# Patient Record
Sex: Female | Born: 2000 | Race: Black or African American | Hispanic: No | Marital: Single | Smoking: Never smoker
Health system: Southern US, Community
[De-identification: ages and names within clinical notes are randomized; demographics above are authoritative.]

## PROBLEM LIST (undated history)

## (undated) DIAGNOSIS — L0291 Cutaneous abscess, unspecified: Secondary | ICD-10-CM

## (undated) DIAGNOSIS — M6282 Rhabdomyolysis: Secondary | ICD-10-CM

## (undated) DIAGNOSIS — J302 Other seasonal allergic rhinitis: Secondary | ICD-10-CM

## (undated) DIAGNOSIS — M3300 Juvenile dermatopolymyositis, organ involvement unspecified: Secondary | ICD-10-CM

## (undated) HISTORY — PX: WISDOM TOOTH EXTRACTION: SHX21

---
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
## 2019-01-07 ENCOUNTER — Telehealth: Payer: Self-pay | Admitting: Family Medicine

## 2019-01-07 NOTE — Telephone Encounter (Signed)
Patient called in to get scheduled for her missed depo injections appointment. Patient was scheduled for 11/19 @ 10:20. Patient instructed to wear a face mask for the entire appointment and no visitors are allowed with her during the visit. Patient screened for covid symptoms and denied having any

## 2019-01-09 ENCOUNTER — Other Ambulatory Visit: Payer: Self-pay

## 2019-01-09 ENCOUNTER — Ambulatory Visit (INDEPENDENT_AMBULATORY_CARE_PROVIDER_SITE_OTHER): Payer: BLUE CROSS/BLUE SHIELD | Admitting: General Practice

## 2019-01-09 VITALS — BP 117/71 | HR 72 | Ht 63.0 in | Wt 237.0 lb

## 2019-01-09 DIAGNOSIS — Z3042 Encounter for surveillance of injectable contraceptive: Secondary | ICD-10-CM

## 2019-01-09 DIAGNOSIS — Z3202 Encounter for pregnancy test, result negative: Secondary | ICD-10-CM

## 2019-01-09 LAB — POCT PREGNANCY, URINE: Preg Test, Ur: NEGATIVE

## 2019-01-09 NOTE — Progress Notes (Signed)
Haley Sandoval here for Depo-Provera  Injection. Last injection was 08/19/18. Patient reports intercourse without condoms. LMP 11/5 and last date of unprotected intercourse 11/14. UPT is - today. Discussed with patient returning in a week for repeat UPT & if negative can receive depo then. Advised no intercourse between now & then or to use condoms. Condoms also provided today. Patient verbalized understanding & had no questions.  Derinda Late, RN 01/09/2019  10:39 AM

## 2019-01-09 NOTE — Progress Notes (Signed)
Patient seen and assessed by nursing staff.  Agree with documentation and plan.  

## 2019-01-20 ENCOUNTER — Ambulatory Visit (INDEPENDENT_AMBULATORY_CARE_PROVIDER_SITE_OTHER): Payer: BLUE CROSS/BLUE SHIELD | Admitting: *Deleted

## 2019-01-20 ENCOUNTER — Other Ambulatory Visit: Payer: Self-pay

## 2019-01-20 DIAGNOSIS — Z3042 Encounter for surveillance of injectable contraceptive: Secondary | ICD-10-CM

## 2019-01-20 DIAGNOSIS — Z30019 Encounter for initial prescription of contraceptives, unspecified: Secondary | ICD-10-CM

## 2019-01-20 LAB — POCT PREGNANCY, URINE: Preg Test, Ur: NEGATIVE

## 2019-01-20 MED ORDER — MEDROXYPROGESTERONE ACETATE 150 MG/ML IM SUSP
150.0000 mg | Freq: Once | INTRAMUSCULAR | Status: AC
  Administered 2019-01-20: 19:00:00 150 mg via INTRAMUSCULAR

## 2019-01-20 NOTE — Progress Notes (Signed)
I reviewed the above documentation and agree with the plan as above  

## 2019-01-20 NOTE — Progress Notes (Signed)
Here for depo injection. UPT negative today and on intercourse in the last wk. Has had depo in the past but late with this dose. Depo given and pt tol well. Will schedule next injection today when checks out.

## 2019-04-08 ENCOUNTER — Other Ambulatory Visit: Payer: Self-pay

## 2019-04-08 ENCOUNTER — Ambulatory Visit (INDEPENDENT_AMBULATORY_CARE_PROVIDER_SITE_OTHER): Payer: BLUE CROSS/BLUE SHIELD

## 2019-04-08 VITALS — BP 132/90 | HR 72 | Wt 232.5 lb

## 2019-04-08 DIAGNOSIS — Z3042 Encounter for surveillance of injectable contraceptive: Secondary | ICD-10-CM

## 2019-04-08 MED ORDER — MEDROXYPROGESTERONE ACETATE 150 MG/ML IM SUSP
150.0000 mg | Freq: Once | INTRAMUSCULAR | Status: AC
  Administered 2019-04-08: 14:00:00 150 mg via INTRAMUSCULAR

## 2019-04-08 NOTE — Progress Notes (Signed)
Patient seen and assessed by nursing staff during this encounter. I have reviewed the chart and agree with the documentation and plan.  Sharyon Cable, CNM 04/08/2019 4:28 PM

## 2019-04-08 NOTE — Progress Notes (Signed)
Haley Sandoval here for Depo-Provera  Injection.  Injection administered without complication. Patient will return in 3 months for next injection.  Pt states she is interested in changing birth control method because she feels like Depo Provera is causing weight gain. Explained to pt she will need to have provider visit in order to discuss new BC. Available types of [facility] reviewed with pt; handouts given for pt to review. Pt states she will schedule annual visit/BC discussion once she has resolved some temporary transportation issues.   Marjo Bicker, RN 04/08/2019  1:33 PM

## 2019-06-24 ENCOUNTER — Ambulatory Visit (INDEPENDENT_AMBULATORY_CARE_PROVIDER_SITE_OTHER): Payer: Medicaid Other

## 2019-06-24 ENCOUNTER — Other Ambulatory Visit: Payer: Self-pay

## 2019-06-24 VITALS — BP 126/73 | HR 98 | Wt 238.2 lb

## 2019-06-24 DIAGNOSIS — Z3042 Encounter for surveillance of injectable contraceptive: Secondary | ICD-10-CM

## 2019-06-24 MED ORDER — MEDROXYPROGESTERONE ACETATE 150 MG/ML IM SUSP
150.0000 mg | Freq: Once | INTRAMUSCULAR | Status: AC
  Administered 2019-06-24: 150 mg via INTRAMUSCULAR

## 2019-06-24 NOTE — Progress Notes (Signed)
Haley Sandoval here for Depo-Provera  Injection. Per chart review, pt was considering changing birth control method at last visit. Pt states she would like to continue Depo Provera at this time.  Injection administered without complication. Patient will return in 3 months for next injection.  Marjo Bicker, RN 06/24/2019 1415

## 2019-07-03 NOTE — Progress Notes (Signed)
Chart reviewed for nurse visit. Agree with plan of care.   Natallia Stellmach Lorraine, CNM 07/03/2019 5:52 PM   

## 2019-07-08 DIAGNOSIS — Z113 Encounter for screening for infections with a predominantly sexual mode of transmission: Secondary | ICD-10-CM | POA: Diagnosis not present

## 2019-07-08 DIAGNOSIS — R8281 Pyuria: Secondary | ICD-10-CM | POA: Diagnosis not present

## 2019-09-09 ENCOUNTER — Other Ambulatory Visit: Payer: Self-pay

## 2019-09-09 ENCOUNTER — Ambulatory Visit (INDEPENDENT_AMBULATORY_CARE_PROVIDER_SITE_OTHER): Payer: Medicaid Other | Admitting: General Practice

## 2019-09-09 VITALS — BP 122/80 | HR 54 | Ht 67.0 in | Wt 263.0 lb

## 2019-09-09 DIAGNOSIS — Z3042 Encounter for surveillance of injectable contraceptive: Secondary | ICD-10-CM | POA: Diagnosis not present

## 2019-09-09 MED ORDER — MEDROXYPROGESTERONE ACETATE 150 MG/ML IM SUSP
150.0000 mg | Freq: Once | INTRAMUSCULAR | Status: AC
  Administered 2019-09-09: 150 mg via INTRAMUSCULAR

## 2019-09-09 NOTE — Progress Notes (Signed)
Lafe Garin here for Depo-Provera  Injection.  Injection administered without complication. Patient will return in 3 months for next injection.  Marylynn Pearson, RN 09/09/2019  9:07 AM

## 2019-09-10 NOTE — Progress Notes (Signed)
Chart reviewed for nurse visit. Agree with plan of care.   Gamaliel Charney Lorraine, CNM 09/10/2019 10:35 AM   

## 2019-11-25 ENCOUNTER — Ambulatory Visit: Payer: Medicaid Other | Admitting: Lactation Services

## 2019-11-25 ENCOUNTER — Ambulatory Visit (INDEPENDENT_AMBULATORY_CARE_PROVIDER_SITE_OTHER): Payer: Medicaid Other | Admitting: Nurse Practitioner

## 2019-11-25 ENCOUNTER — Other Ambulatory Visit: Payer: Self-pay

## 2019-11-25 ENCOUNTER — Encounter: Payer: Self-pay | Admitting: Nurse Practitioner

## 2019-11-25 ENCOUNTER — Encounter: Payer: Self-pay | Admitting: Lactation Services

## 2019-11-25 VITALS — BP 124/73 | HR 70 | Ht 65.0 in | Wt 278.8 lb

## 2019-11-25 DIAGNOSIS — Z01419 Encounter for gynecological examination (general) (routine) without abnormal findings: Secondary | ICD-10-CM

## 2019-11-25 DIAGNOSIS — Z3042 Encounter for surveillance of injectable contraceptive: Secondary | ICD-10-CM

## 2019-11-25 MED ORDER — MEDROXYPROGESTERONE ACETATE 150 MG/ML IM SUSP
150.0000 mg | Freq: Once | INTRAMUSCULAR | Status: AC
  Administered 2019-11-25: 150 mg via INTRAMUSCULAR

## 2019-11-25 NOTE — Patient Instructions (Addendum)
Intrauterine Device Information An intrauterine device (IUD) is a medical device that is inserted in the uterus to prevent pregnancy. It is a small, T-shaped device that has one or two nylon strings hanging down from it. The strings hang out of the lower part of the uterus (cervix) to allow for future IUD removal. There are two types of IUDs available:  Hormone IUD. This type of IUD is made of plastic and contains the hormone progestin (synthetic progesterone). A hormone IUD may last 3-5 years.  Copper IUD. This type of IUD has copper wire wrapped around it. A copper IUD may last up to 10 years. How is an IUD inserted? An IUD is inserted through the vagina and placed into the uterus with a minor medical procedure. The exact procedure for IUD insertion may vary among health care providers and hospitals. How does an IUD work? Synthetic progesterone in a hormonal IUD prevents pregnancy by:  Thickening cervical mucus to prevent sperm from entering the uterus.  Thinning the uterine lining to prevent a fertilized egg from being implanted there. Copper in a copper IUD prevents pregnancy by making the uterus and fallopian tubes produce a fluid that kills sperm. What are the advantages of an IUD? Advantages of either type of IUD  It is highly effective in preventing pregnancy.  It is reversible. You can become pregnant shortly after the IUD is removed.  It is low-maintenance and can stay in place for a long time.  There are no estrogen-related side effects.  It can be used when breastfeeding.  It is not associated with weight gain.  It can be inserted right after childbirth, an abortion, or a miscarriage. Advantages of a hormone IUD  If it is inserted within 7 days of your period starting, it works right after it is inserted. If the hormone IUD is inserted at any other time in your cycle, you will need to use a backup method of birth control for 7 days after insertion.  It can make  menstrual periods lighter.  It can reduce menstrual cramping.  It can be used for 3-5 years. Advantages of a copper IUD  It works right after it is inserted.  It can be used as a form of emergency birth control if it is inserted within 5 days after having unprotected sex.  It does not interfere with your body's natural hormones.  It can be used for 10 years. What are the disadvantages of an IUD?  An IUD may cause irregular menstrual bleeding for a period of time after insertion.  You may have pain during insertion and have cramping and vaginal bleeding after insertion.  An IUD may cut the uterus (uterine perforation) when it is inserted. This is rare.  An IUD may cause pelvic inflammatory disease (PID), which is an infection in the uterus and fallopian tubes. This is rare, and it usually happens during the first 20 days after the IUD is inserted.  A copper IUD can make your menstrual flow heavier and more painful. How is an IUD removed?  You will lie on your back with your knees bent and your feet in footrests (stirrups).  A device will be inserted into your vagina to spread apart the vaginal walls (speculum). This will allow your health care provider to see the strings attached to the IUD.  Your health care provider will use a small instrument (forceps) to grasp the IUD strings and pull firmly until the IUD is removed. You may have some discomfort   when the IUD is removed. Your health care provider may recommend taking over-the-counter pain relievers, such as ibuprofen, before the procedure. You may also have minor spotting for a few days after the procedure. The exact procedure for IUD removal may vary among health care providers and hospitals. Is the IUD right for me? Your health care provider will make sure you are a good candidate for an IUD and will discuss the advantages, disadvantages, and possible side effects with you. Summary  An intrauterine device (IUD) is a medical  device that is inserted in the uterus to prevent pregnancy. It is a small, T-shaped device that has one or two nylon strings hanging down from it.  A hormone IUD contains the hormone progestin (synthetic progesterone). A copper IUD has copper wire wrapped around it.  Synthetic progesterone in a hormone IUD prevents pregnancy by thickening cervical mucus and thinning the walls of the uterus. Copper in a copper IUD prevents pregnancy by making the uterus and fallopian tubes produce a fluid that kills sperm.  A hormone IUD can be left in place for 3-5 years. A copper IUD can be left in place for up to 10 years.  An IUD is inserted and removed by a health care provider. You may feel some pain during insertion and removal. Your health care provider may recommend taking over-the-counter pain medicine, such as ibuprofen, before an IUD procedure. This information is not intended to replace advice given to you by your health care provider. Make sure you discuss any questions you have with your health care provider. Document Revised: 01/19/2017 Document Reviewed: 03/07/2016 Elsevier Patient Education  2020 [employer].    Levonorgestrel intrauterine device (IUD) What is this medicine? LEVONORGESTREL IUD (LEE voe nor jes trel) is a contraceptive (birth control) device. The device is placed inside the uterus by a healthcare professional. It is used to prevent pregnancy. This device can also be used to treat heavy bleeding that occurs during your period. This medicine may be used for other purposes; ask your health care provider or pharmacist if you have questions. COMMON BRAND NAME(S): Cameron Ali What should I tell my health care provider before I take this medicine? They need to know if you have any of these conditions:  abnormal Pap smear  cancer of the breast, uterus, or cervix  diabetes  endometritis  genital or pelvic infection now or in the past  have more than one  sexual partner or your partner has more than one partner  heart disease  history of an ectopic or tubal pregnancy  immune system problems  IUD in place  liver disease or tumor  problems with blood clots or take blood-thinners  seizures  use intravenous drugs  uterus of unusual shape  vaginal bleeding that has not been explained  an unusual or allergic reaction to levonorgestrel, other hormones, silicone, or polyethylene, medicines, foods, dyes, or preservatives  pregnant or trying to get pregnant  breast-feeding How should I use this medicine? This device is placed inside the uterus by a health care professional. Talk to your pediatrician regarding the use of this medicine in children. Special care may be needed. Overdosage: If you think you have taken too much of this medicine contact a poison control center or emergency room at once. NOTE: This medicine is only for you. Do not share this medicine with others. What if I miss a dose? This does not apply. Depending on the brand of device you have inserted, the device  will need to be replaced every 3 to 6 years if you wish to continue using this type of birth control. What may interact with this medicine? Do not take this medicine with any of the following medications:  amprenavir  bosentan  fosamprenavir This medicine may also interact with the following medications:  aprepitant  armodafinil  barbiturate medicines for inducing sleep or treating seizures  bexarotene  boceprevir  griseofulvin  medicines to treat seizures like carbamazepine, ethotoin, felbamate, oxcarbazepine, phenytoin, topiramate  modafinil  pioglitazone  rifabutin  rifampin  rifapentine  some medicines to treat HIV infection like atazanavir, efavirenz, indinavir, lopinavir, nelfinavir, tipranavir, ritonavir  St. John's wort  warfarin This list may not describe all possible interactions. Give your health care provider a list of  all the medicines, herbs, non-prescription drugs, or dietary supplements you use. Also tell them if you smoke, drink alcohol, or use illegal drugs. Some items may interact with your medicine. What should I watch for while using this medicine? Visit your doctor or health care professional for regular check ups. See your doctor if you or your partner has sexual contact with others, becomes HIV positive, or gets a sexual transmitted disease. This product does not protect you against HIV infection (AIDS) or other sexually transmitted diseases. You can check the placement of the IUD yourself by reaching up to the top of your vagina with clean fingers to feel the threads. Do not pull on the threads. It is a good habit to check placement after each menstrual period. Call your doctor right away if you feel more of the IUD than just the threads or if you cannot feel the threads at all. The IUD may come out by itself. You may become pregnant if the device comes out. If you notice that the IUD has come out use a backup birth control method like condoms and call your health care provider. Using tampons will not change the position of the IUD and are okay to use during your period. This IUD can be safely scanned with magnetic resonance imaging (MRI) only under specific conditions. Before you have an MRI, tell your healthcare provider that you have an IUD in place, and which type of IUD you have in place. What side effects may I notice from receiving this medicine? Side effects that you should report to your doctor or health care professional as soon as possible:  allergic reactions like skin rash, itching or hives, swelling of the face, lips, or tongue  fever, flu-like symptoms  genital sores  high blood pressure  no menstrual period for 6 weeks during use  pain, swelling, warmth in the leg  pelvic pain or tenderness  severe or sudden headache  signs of pregnancy  stomach cramping  sudden shortness  of breath  trouble with balance, talking, or walking  unusual vaginal bleeding, discharge  yellowing of the eyes or skin Side effects that usually do not require medical attention (report to your doctor or health care professional if they continue or are bothersome):  acne  breast pain  change in sex drive or performance  changes in weight  cramping, dizziness, or faintness while the device is being inserted  headache  irregular menstrual bleeding within first 3 to 6 months of use  nausea This list may not describe all possible side effects. Call your doctor for medical advice about side effects. You may report side effects to FDA at 1-800-FDA-1088. Where should I keep my medicine? This does not apply. NOTE:  This sheet is a summary. It may not cover all possible information. If you have questions about this medicine, talk to your doctor, pharmacist, or health care provider.  2020 Elsevier/Gold Standard (2017-12-18 13:22:01)      Etonogestrel implant What is this medicine? ETONOGESTREL (et oh noe JES trel) is a contraceptive (birth control) device. It is used to prevent pregnancy. It can be used for up to 3 years. This medicine may be used for other purposes; ask your health care provider or pharmacist if you have questions. COMMON BRAND NAME(S): Implanon, Nexplanon What should I tell my health care provider before I take this medicine? They need to know if you have any of these conditions:  abnormal vaginal bleeding  blood vessel disease or blood clots  breast, cervical, endometrial, ovarian, liver, or uterine cancer  diabetes  gallbladder disease  heart disease or recent heart attack  high blood pressure  high cholesterol or triglycerides  kidney disease  liver disease  migraine headaches  seizures  stroke  tobacco smoker  an unusual or allergic reaction to etonogestrel, anesthetics or antiseptics, other medicines, foods, dyes, or  preservatives  pregnant or trying to get pregnant  breast-feeding How should I use this medicine? This device is inserted just under the skin on the inner side of your upper arm by a health care professional. Talk to your pediatrician regarding the use of this medicine in children. Special care may be needed. Overdosage: If you think you have taken too much of this medicine contact a poison control center or emergency room at once. NOTE: This medicine is only for you. Do not share this medicine with others. What if I miss a dose? This does not apply. What may interact with this medicine? Do not take this medicine with any of the following medications:  amprenavir  fosamprenavir This medicine may also interact with the following medications:  acitretin  aprepitant  armodafinil  bexarotene  bosentan  carbamazepine  certain medicines for fungal infections like fluconazole, ketoconazole, itraconazole and voriconazole  certain medicines to treat hepatitis, HIV or AIDS  cyclosporine  felbamate  griseofulvin  lamotrigine  modafinil  oxcarbazepine  phenobarbital  phenytoin  primidone  rifabutin  rifampin  rifapentine  St. John's wort  topiramate This list may not describe all possible interactions. Give your health care provider a list of all the medicines, herbs, non-prescription drugs, or dietary supplements you use. Also tell them if you smoke, drink alcohol, or use illegal drugs. Some items may interact with your medicine. What should I watch for while using this medicine? This product does not protect you against HIV infection (AIDS) or other sexually transmitted diseases. You should be able to feel the implant by pressing your fingertips over the skin where it was inserted. Contact your doctor if you cannot feel the implant, and use a non-hormonal birth control method (such as condoms) until your doctor confirms that the implant is in place. Contact  your doctor if you think that the implant may have broken or become bent while in your arm. You will receive a user card from your health care provider after the implant is inserted. The card is a record of the location of the implant in your upper arm and when it should be removed. Keep this card with your health records. What side effects may I notice from receiving this medicine? Side effects that you should report to your doctor or health care professional as soon as possible:  allergic reactions like  skin rash, itching or hives, swelling of the face, lips, or tongue  breast lumps, breast tissue changes, or discharge  breathing problems  changes in emotions or moods  coughing up blood  if you feel that the implant may have broken or bent while in your arm  high blood pressure  pain, irritation, swelling, or bruising at the insertion site  scar at site of insertion  signs of infection at the insertion site such as fever, and skin redness, pain or discharge  signs and symptoms of a blood clot such as breathing problems; changes in vision; chest pain; severe, sudden headache; pain, swelling, warmth in the leg; trouble speaking; sudden numbness or weakness of the face, arm or leg  signs and symptoms of liver injury like dark yellow or brown urine; general ill feeling or flu-like symptoms; light-colored stools; loss of appetite; nausea; right upper belly pain; unusually weak or tired; yellowing of the eyes or skin  unusual vaginal bleeding, discharge Side effects that usually do not require medical attention (report to your doctor or health care professional if they continue or are bothersome):  acne  breast pain or tenderness  headache  irregular menstrual bleeding  nausea This list may not describe all possible side effects. Call your doctor for medical advice about side effects. You may report side effects to FDA at 1-800-FDA-1088. Where should I keep my medicine? This  drug is given in a hospital or clinic and will not be stored at home. NOTE: This sheet is a summary. It may not cover all possible information. If you have questions about this medicine, talk to your doctor, pharmacist, or health care provider.  2020 Elsevier/Gold Standard (2018-11-19 11:33:04)     Hormonal Contraception Information Hormonal contraception is a type of birth control that uses hormones to prevent pregnancy. It usually involves a combination of the hormones estrogen and progesterone or only the hormone progesterone. Hormonal contraception works in these ways:  It thickens the mucus in the cervix, making it harder for sperm to enter the uterus.  It changes the lining of the uterus, making it harder for an egg to implant.  It may stop the ovaries from releasing eggs (ovulation). Some women who take hormonal contraceptives that contain only progesterone may continue to ovulate. Hormonal contraception cannot prevent sexually transmitted infections (STIs). Pregnancy may still occur. Estrogen and progesterone contraceptives Contraceptives that use a combination of estrogen and progesterone are available in these forms:  Pill. Pills come in different combinations of hormones. They must be taken at the same time each day. Pills can affect your period, causing you to get your period once every three months or not at all.  Patch. The patch must be worn on the lower abdomen for three weeks and then removed on the fourth.  Vaginal ring. The ring is placed in the vagina and left there for three weeks. It is then removed for one week. Progesterone contraceptives Contraceptives that use progesterone only are available in these forms:  Pill. Pills should be taken every day of the cycle.  Intrauterine device (IUD). This device is inserted into the uterus and removed or replaced every five years or sooner.  Implant. Plastic rods are placed under the skin of the upper arm. They are removed  or replaced every three years or sooner.  Injection. The injection is given once every 90 days. What are the side effects? The side effects of estrogen and progesterone contraceptives include:  Nausea.  Headaches.  Breast tenderness.  Bleeding or spotting between menstrual cycles.  High blood pressure (rare).  Strokes, heart attacks, or blood clots (rare) Side effects of progesterone-only contraceptives include:  Nausea.  Headaches.  Breast tenderness.  Unpredictable menstrual bleeding.  High blood pressure (rare). Talk to your health care provider about what side effects may affect you. Where to find more information  Ask your health care provider for more information and resources about hormonal contraception.  [place] Department of Health and [employer]ist on Women's Health: http://hoffman.[email or web address]/ Questions to ask:  What type of hormonal contraception is right for me?  How long should I plan to use hormonal contraception?  What are the side effects of the hormonal contraception method I choose?  How can I prevent STIs while using hormonal contraception? Contact a health care provider if:  You start taking hormonal contraceptives and you develop persistent or severe side effects. Summary  Estrogen and progesterone are hormones used in many forms of birth control.  Talk to your health care provider about what side effects may affect you.  Hormonal contraception cannot prevent sexually transmitted infections (STIs).  Ask your health care provider for more information and resources about hormonal contraception. This information is not intended to replace advice given to you by your health care provider. Make sure you discuss any questions you have with your health care provider. Document Revised: 06/03/2018 Document Reviewed: 01/07/2016 Elsevier Patient Education  2020 [employer].

## 2019-11-25 NOTE — Progress Notes (Signed)
Patient asking about other Birth control methods that may help with less weight gain.   Patient reports she does not have a cycle since on Depo. She has spotting close to next dosage that last for 2-3 days.   Patient transferred to Nolene Bernheim, NNP schedule for annual exam.

## 2019-11-25 NOTE — Progress Notes (Signed)
Patient in the office for scheduled Depo shot. She has concerns with weight gain with depo and wants to know if there are other options. Patient transferred to Nolene Bernheim, NNP for annual exam.   She reports she has about 3 days of spotting prior to timing of next dosage, otherwise no periods.   She has no other questions or concerns at this time.   Lafe Garin here for Depo-Provera  Injection.  Injection administered without complication. Patient will return in 3 months for next injection. Patient tolerated well.   Ed Blalock, RN 11/25/2019  10:09 AM

## 2019-11-25 NOTE — Progress Notes (Addendum)
History:  Ms. Haley Sandoval is a 19 y.o. No obstetric history on file. who presents to clinic today for annual exam, depo-provera injection, and contraception discussion due to concerns of weight gain. Patient states she has been on Depo since her miscarriage in 2019. She reports increased weight since being on the medication. She desires contraception that she will have little to no period while not gaining weight. Otherwise, no concerns. Patient is sexually active. Her bleeding occurs in the last few days before her next shot. Reports regular periods lasting 6 days, medium heaviness, and no cramping prior to depo.  The following portions of the patient's history were reviewed and updated as appropriate: allergies, current medications, family history, past medical history, social history, past surgical history and problem list.  Review of Systems:  Review of Systems  Constitutional:       Weight Gain  Respiratory: Negative.   Cardiovascular: Negative for chest pain and palpitations.  Genitourinary: Negative.   All other systems reviewed and are negative.     Objective:  Physical Exam BP 124/73 (BP Location: Left Arm, Patient Position: Sitting, Cuff Size: Large)   Pulse 70   Ht 5\' 5"  (1.651 m)   Wt 278 lb 12.8 oz (126.5 kg)   BMI 46.39 kg/m  Physical Exam Constitutional:      General: She is not in acute distress.    Appearance: Normal appearance. She is obese.  Neck:     Thyroid: No thyroid mass, thyromegaly or thyroid tenderness.  Cardiovascular:     Rate and Rhythm: Normal rate and regular rhythm.     Pulses: Normal pulses.     Heart sounds: Normal heart sounds. No murmur heard.  No friction rub. No gallop.   Pulmonary:     Effort: Pulmonary effort is normal.     Breath sounds: Normal breath sounds. No wheezing.  Abdominal:     General: Abdomen is flat. Bowel sounds are normal.     Palpations: Abdomen is soft.  Skin:    General: Skin is warm and dry.     Capillary  Refill: Capillary refill takes less than 2 seconds.  Neurological:     General: No focal deficit present.     Mental Status: She is alert and oriented to person, place, and time.  Psychiatric:        Mood and Affect: Mood normal.        Behavior: Behavior normal.       Labs and Imaging No results found for this or any previous visit (from the past 24 hour(s)).  No results found.   Assessment & Plan:  1. Encounter for Contraception Management - Received Depo-provera today  - Discussed options for contraception in the future. Pt is considering IUD. Information about options provided to patient.   2. Encounter for Annual Gyn Exam  - pap not performed due to age of 60 - Discussed weight gain since she has been on Depo-provera. Patient is considering other contraception options.  - BP WNL, no thyromegaly   Approximately 20 minutes of total time was spent with this patient on 11/25/19  01/25/20 11/25/2019 10:24 AM  Aattestation:  I have seen and examined this patient and agree with above documentation in the PA's note.   Haley Sandoval is a 19 y.o. No obstetric history on file. female reporting   Associated symptoms:  No fever and chills No abdominal pain No vaginal bleeding No vaginal discharge No urinary complaints No  GI complaints Having weight gain associated with Depo   PE: Today's Vitals   11/25/19 0941  BP: 124/73  Pulse: 70  Weight: 278 lb 12.8 oz (126.5 kg)  Height: 5\' 5"  (1.651 m)   Body mass index is 46.39 kg/m.  Gen: calm comfortable, NAD Resp: normal effort, no distress Heart: Regular rate Abd: Soft, NT, Pos BS x 4 Neuro: A&O x 4 Pelvic exam: deferd ROS, labs, PMH reviewed  No orders of the defined types were placed in this encounter.  Meds ordered this encounter  Medications  . medroxyPROGESTERone (DEPO-PROVERA) injection 150 mg    MDM Strategies for minimizing weight gain discussed Discussed alternative  contraceptive methods and considering IUD  Assessment Supervision of depo contraceptive Obesity  Plan: - Discharge home in stable condition. - Follow-up as scheduled   , NP 11/25/2019 11:11 AM

## 2020-01-17 DIAGNOSIS — R059 Cough, unspecified: Secondary | ICD-10-CM | POA: Diagnosis not present

## 2020-01-18 ENCOUNTER — Inpatient Hospital Stay (HOSPITAL_COMMUNITY)
Admission: EM | Admit: 2020-01-18 | Discharge: 2020-01-22 | DRG: 558 | Disposition: A | Discharge status: Home Health | Payer: Medicaid Other | Attending: Internal Medicine | Admitting: Internal Medicine

## 2020-01-18 ENCOUNTER — Emergency Department (HOSPITAL_COMMUNITY): Payer: Medicaid Other

## 2020-01-18 ENCOUNTER — Ambulatory Visit
Admission: RE | Admit: 2020-01-18 | Discharge: 2020-01-18 | Disposition: A | Discharge status: Home / Self Care | Payer: Medicaid Other | Source: Ambulatory Visit | Attending: Emergency Medicine | Admitting: Emergency Medicine

## 2020-01-18 ENCOUNTER — Encounter (HOSPITAL_COMMUNITY): Payer: Self-pay

## 2020-01-18 ENCOUNTER — Other Ambulatory Visit: Payer: Self-pay

## 2020-01-18 VITALS — BP 129/84 | HR 135 | Temp 100.7°F | Resp 18

## 2020-01-18 DIAGNOSIS — E871 Hypo-osmolality and hyponatremia: Secondary | ICD-10-CM

## 2020-01-18 DIAGNOSIS — E8809 Other disorders of plasma-protein metabolism, not elsewhere classified: Secondary | ICD-10-CM

## 2020-01-18 DIAGNOSIS — R509 Fever, unspecified: Secondary | ICD-10-CM | POA: Diagnosis not present

## 2020-01-18 DIAGNOSIS — R52 Pain, unspecified: Secondary | ICD-10-CM | POA: Diagnosis not present

## 2020-01-18 DIAGNOSIS — R748 Abnormal levels of other serum enzymes: Secondary | ICD-10-CM

## 2020-01-18 DIAGNOSIS — M6282 Rhabdomyolysis: Secondary | ICD-10-CM | POA: Diagnosis not present

## 2020-01-18 DIAGNOSIS — L989 Disorder of the skin and subcutaneous tissue, unspecified: Secondary | ICD-10-CM | POA: Diagnosis not present

## 2020-01-18 DIAGNOSIS — N83209 Unspecified ovarian cyst, unspecified side: Secondary | ICD-10-CM | POA: Diagnosis present

## 2020-01-18 DIAGNOSIS — R1110 Vomiting, unspecified: Secondary | ICD-10-CM

## 2020-01-18 DIAGNOSIS — E860 Dehydration: Secondary | ICD-10-CM | POA: Diagnosis present

## 2020-01-18 DIAGNOSIS — N83291 Other ovarian cyst, right side: Secondary | ICD-10-CM | POA: Diagnosis not present

## 2020-01-18 DIAGNOSIS — N739 Female pelvic inflammatory disease, unspecified: Secondary | ICD-10-CM | POA: Diagnosis not present

## 2020-01-18 DIAGNOSIS — K759 Inflammatory liver disease, unspecified: Secondary | ICD-10-CM | POA: Diagnosis not present

## 2020-01-18 DIAGNOSIS — Z20822 Contact with and (suspected) exposure to covid-19: Secondary | ICD-10-CM | POA: Diagnosis present

## 2020-01-18 DIAGNOSIS — R109 Unspecified abdominal pain: Secondary | ICD-10-CM | POA: Diagnosis not present

## 2020-01-18 DIAGNOSIS — K5900 Constipation, unspecified: Secondary | ICD-10-CM

## 2020-01-18 DIAGNOSIS — R000 Tachycardia, unspecified: Secondary | ICD-10-CM | POA: Diagnosis not present

## 2020-01-18 DIAGNOSIS — Z6841 Body Mass Index (BMI) 40.0 and over, adult: Secondary | ICD-10-CM

## 2020-01-18 DIAGNOSIS — E66813 Obesity, class 3: Secondary | ICD-10-CM

## 2020-01-18 LAB — COMPREHENSIVE METABOLIC PANEL
ALT: 142 U/L — ABNORMAL HIGH (ref 0–44)
AST: 528 U/L — ABNORMAL HIGH (ref 15–41)
Albumin: 3.1 g/dL — ABNORMAL LOW (ref 3.5–5.0)
Alkaline Phosphatase: 50 U/L (ref 38–126)
Anion gap: 7 (ref 5–15)
BUN: 10 mg/dL (ref 6–20)
CO2: 26 mmol/L (ref 22–32)
Calcium: 8.2 mg/dL — ABNORMAL LOW (ref 8.9–10.3)
Chloride: 99 mmol/L (ref 98–111)
Creatinine, Ser: 0.65 mg/dL (ref 0.44–1.00)
GFR, Estimated: >60 mL/min (ref >60)
Glucose, Bld: 104 mg/dL — ABNORMAL HIGH (ref 70–99)
Potassium: 4.1 mmol/L (ref 3.5–5.1)
Sodium: 132 mmol/L — ABNORMAL LOW (ref 135–145)
Total Bilirubin: 0.1 mg/dL — ABNORMAL LOW (ref 0.3–1.2)
Total Protein: 6.3 g/dL — ABNORMAL LOW (ref 6.5–8.1)

## 2020-01-18 LAB — CBC WITH DIFFERENTIAL/PLATELET
Abs Immature Granulocytes: 0.11 10*3/uL — ABNORMAL HIGH (ref 0.00–0.07)
Basophils Absolute: 0 10*3/uL (ref 0.0–0.1)
Basophils Relative: 0 %
Eosinophils Absolute: 0 10*3/uL (ref 0.0–0.5)
Eosinophils Relative: 0 %
HCT: 45.3 % (ref 36.0–46.0)
Hemoglobin: 14.8 g/dL (ref 12.0–15.0)
Immature Granulocytes: 1 %
Lymphocytes Relative: 19 %
Lymphs Abs: 1.5 10*3/uL (ref 0.7–4.0)
MCH: 28.8 pg (ref 26.0–34.0)
MCHC: 32.7 g/dL (ref 30.0–36.0)
MCV: 88.1 fL (ref 80.0–100.0)
Monocytes Absolute: 0.7 10*3/uL (ref 0.1–1.0)
Monocytes Relative: 9 %
Neutro Abs: 5.7 10*3/uL (ref 1.7–7.7)
Neutrophils Relative %: 71 %
Platelets: 300 10*3/uL (ref 150–400)
RBC: 5.14 MIL/uL — ABNORMAL HIGH (ref 3.87–5.11)
RDW: 14.5 % (ref 11.5–15.5)
WBC: 8.1 10*3/uL (ref 4.0–10.5)
nRBC: 0 % (ref 0.0–0.2)

## 2020-01-18 LAB — URINALYSIS, ROUTINE W REFLEX MICROSCOPIC
Bilirubin Urine: NEGATIVE
Glucose, UA: NEGATIVE mg/dL
Ketones, ur: NEGATIVE mg/dL
Leukocytes,Ua: NEGATIVE
Nitrite: NEGATIVE
Protein, ur: 100 mg/dL — AB
Specific Gravity, Urine: 1.028 (ref 1.005–1.030)
pH: 5 (ref 5.0–8.0)

## 2020-01-18 LAB — SALICYLATE LEVEL: Salicylate Lvl: <7 mg/dL — ABNORMAL LOW (ref 7.0–30.0)

## 2020-01-18 LAB — C-REACTIVE PROTEIN: CRP: 3.3 mg/dL — ABNORMAL HIGH (ref <1.0)

## 2020-01-18 LAB — TSH: TSH: 2.131 u[IU]/mL (ref 0.350–4.500)

## 2020-01-18 LAB — SEDIMENTATION RATE: Sed Rate: 15 mm/hr (ref 0–22)

## 2020-01-18 LAB — ACETAMINOPHEN LEVEL: Acetaminophen (Tylenol), Serum: <10 ug/mL — ABNORMAL LOW (ref 10–30)

## 2020-01-18 LAB — CK: Total CK: 31230 U/L — ABNORMAL HIGH (ref 38–234)

## 2020-01-18 LAB — RESP PANEL BY RT-PCR (FLU A&B, COVID) ARPGX2
Influenza A by PCR: NEGATIVE
Influenza B by PCR: NEGATIVE
SARS Coronavirus 2 by RT PCR: NEGATIVE

## 2020-01-18 LAB — PREGNANCY, URINE: Preg Test, Ur: NEGATIVE

## 2020-01-18 MED ORDER — IOHEXOL 300 MG/ML  SOLN
100.0000 mL | Freq: Once | INTRAMUSCULAR | Status: AC | PRN
  Administered 2020-01-18: 100 mL via INTRAVENOUS

## 2020-01-18 MED ORDER — KETOROLAC TROMETHAMINE 30 MG/ML IJ SOLN
15.0000 mg | Freq: Once | INTRAMUSCULAR | Status: AC
  Administered 2020-01-18: 15 mg via INTRAVENOUS
  Filled 2020-01-18: qty 1

## 2020-01-18 MED ORDER — METRONIDAZOLE IN NACL 5-0.79 MG/ML-% IV SOLN: 500.0000 mg | Freq: Once | INTRAVENOUS | Status: DC

## 2020-01-18 MED ORDER — SODIUM CHLORIDE 0.9 % IV BOLUS
1000.0000 mL | Freq: Once | INTRAVENOUS | Status: AC
  Administered 2020-01-18: 1000 mL via INTRAVENOUS

## 2020-01-18 MED ORDER — SODIUM CHLORIDE 0.9 % IV SOLN
1.0000 g | Freq: Once | INTRAVENOUS | Status: AC
  Administered 2020-01-18: 1 g via INTRAVENOUS
  Filled 2020-01-18: qty 10

## 2020-01-18 MED ORDER — SODIUM CHLORIDE 0.9 % IV SOLN
100.0000 mg | Freq: Two times a day (BID) | INTRAVENOUS | Status: DC
  Filled 2020-01-18 (×4): qty 100

## 2020-01-18 MED ORDER — ENSURE ENLIVE PO LIQD
237.0000 mL | Freq: Two times a day (BID) | ORAL | Status: DC
  Administered 2020-01-19 – 2020-01-22 (×3): 237 mL via ORAL
  Filled 2020-01-18 (×4): qty 237

## 2020-01-18 MED ORDER — MORPHINE SULFATE (PF) 2 MG/ML IV SOLN
2.0000 mg | INTRAVENOUS | Status: DC | PRN
  Administered 2020-01-18: 2 mg via INTRAVENOUS
  Filled 2020-01-18: qty 1

## 2020-01-18 MED ORDER — SODIUM CHLORIDE 0.9 % IV SOLN: Freq: Once | INTRAVENOUS | Status: AC

## 2020-01-18 MED ORDER — ENOXAPARIN SODIUM 60 MG/0.6ML ~~LOC~~ SOLN
60.0000 mg | SUBCUTANEOUS | Status: DC
  Administered 2020-01-19 – 2020-01-22 (×4): 60 mg via SUBCUTANEOUS
  Filled 2020-01-18 (×4): qty 0.6

## 2020-01-18 NOTE — ED Notes (Signed)
Date and time results received: 01/18/20 2158 (use smartphrase ".now" to insert current time)  Test: CK Critical Value: 31,230  Name of Provider Notified: Dr. Hyacinth Meeker  Orders Received? Or Actions Taken?: na

## 2020-01-18 NOTE — ED Provider Notes (Signed)
HPI  SUBJECTIVE:  Haley Sandoval is a 19 y.o. female who presents with severe diffuse body aches for the past 10 days.  She denies fevers, headaches, nasal congestion, sinus pain or pressure, ear pain, sore throat, loss of sense of smell or taste.  No cough, chest pain, shortness of breath, wheezing.  No nausea no vomiting, diarrhea, abdominal pain.  No urinary symptoms, vaginal complaints, pelvic pain, back pain.  No photophobia.  No tick bite.  No recent vaccinations.  She states that she had a negative Covid and flu test yesterday.  No antipyretic in the past 6 hours.  She tried Epson salt soaks, Biofreeze, over-the-counter pain medication and Tylenol without improvement in her symptoms.  Her body aches are worse with movement.  Past medical history negative for diabetes, hypertension, asthma, chronic kidney disease, frequent sinusitis, UTI, pyelonephritis, abdominal surgeries.  LMP: She is amenorrheic due to continuous OCPs.  PMD: None.    Past Medical History:  Diagnosis Date  . Seasonal allergies     History reviewed. No pertinent surgical history.  History reviewed. No pertinent family history.  Social History   Tobacco Use  . Smoking status: Never Smoker  . Smokeless tobacco: Never Used  Vaping Use  . Vaping Use: Never used  Substance Use Topics  . Alcohol use: Not Currently  . Drug use: Not Currently    No current facility-administered medications for this encounter.  Current Outpatient Medications:  .  ibuprofen (ADVIL,MOTRIN) 200 MG tablet, Take 200 mg by mouth every 6 (six) hours as needed., Disp: , Rfl:   No Known Allergies   ROS  As noted in HPI.   Physical Exam  BP 129/84 (BP Location: Right Arm)   Pulse (!) 135   Temp (!) 100.7 F (38.2 C) (Oral)   Resp 18   SpO2 98%    Constitutional: Well developed, well nourished, appears ill Eyes: PERRL, EOMI, conjunctiva normal bilaterally, no photophobia HENT: Normocephalic, atraumatic,mucus membranes  moist.  Mild nasal congestion.  No maxillary, frontal sinus tenderness. Neck: No cervical adenopathy, meningismus. Respiratory: Clear to auscultation bilaterally, no rales, no wheezing, no rhonchi Cardiovascular: Regular tachycardia, no murmurs, no gallops, no rubs GI: Soft, nondistended, normal bowel sounds, nontender, no rebound, no guarding Back: no CVAT skin: Diaphoretic.  Large nontender rash with central nodule right buttock see picture.  skin intact    Musculoskeletal: No edema, no tenderness, no deformities Neurologic: Alert & oriented x 3, CN III-XII grossly intact, no motor deficits, sensation grossly intact Psychiatric: Speech and behavior appropriate   ED Course   Medications - No data to display  No orders of the defined types were placed in this encounter.  No results found for this or any previous visit (from the past 24 hour(s)). No results found.  ED Clinical Impression  1. Fever, unspecified   2. Generalized body aches      ED Assessment/Plan  Patient heart rate is 140, she is diaphoretic, febrile at 100.7.  She declined Tylenol and ibuprofen here.  She has no complaints other than body aches.  Exam is unrevealing except for this lesion that she has on her right buttock.  Wonder if this could be the source of her fever and symptoms.  Transferring to the [facility] emergency department for comprehensive evaluation and treatment.  Feel that she is stable given private vehicle.  Patient states that she did not drive herself here.  Discussed rationale for transfer to the emergency department with patient.  She  agrees to go immediately there.  No orders of the defined types were placed in this encounter.   *This clinic note was created using Dragon dictation software. Therefore, there may be occasional mistakes despite careful proofreading.  ?    Domenick Gong, MD 01/18/20 [phone]

## 2020-01-18 NOTE — ED Notes (Signed)
Patient transported to CT 

## 2020-01-18 NOTE — ED Provider Notes (Signed)
[facility] EMERGENCY DEPARTMENT Provider Note   CSN: [id number] Arrival date & time: 01/18/20  1641     History Chief Complaint  Patient presents with  . Generalized Body Aches    Haley Sandoval is a 19 y.o. female with no significant past medical history presenting with an approximate 10-day history of generalized body aches, describing that all of her muscles feel achy and sore.  She denies joint pain or swelling.  She also denies fevers or chills, headache, recent URI type symptoms, cough, shortness of breath.  She also denies nausea or vomiting, abdominal pain, dysuria, hematuria or vaginal complaints.  She underwent Covid and flu testing yesterday which was negative.  She was sent here from our urgent care center today for further evaluation of her symptoms.  Her pain is worse with movement.  She has tried multiple treatments including warm Epsom salt soaks and also applied Biofreeze topically which has not improved her symptoms.  She additionally took Tylenol, stating she took #2 650 mg tablets 2 mornings ago, another dose that evening, this did not improve her symptoms so she did not take any further doses.  She has not been exposed to anyone with similar symptoms.  No recent excessive exercise or trauma.  She does report having a pruritic rash at her right mid buttock region which started last spring and through the summer months it has gradually developed into a dark lesion which had a firm center and was treated with antibiotics 2 weeks ago both oral, name unknown and topical mupirocin and states this has improved but is still present.  Of note please refer to the urgent care note for photo of this lesion.  HPI     Past Medical History:  Diagnosis Date  . Seasonal allergies     Patient Active Problem List   Diagnosis Date Noted  . Rhabdomyolysis 01/18/2020  . Elevated liver enzymes 01/18/2020  . Acute febrile illness 01/18/2020  . Hypoalbuminemia 01/18/2020  . Hyponatremia  01/18/2020  . Obesity, Class III, BMI 40-49.9 (morbid obesity) (HCC) 01/18/2020    History reviewed. No pertinent surgical history.   OB History   No obstetric history on file.     History reviewed. No pertinent family history.  Social History   Tobacco Use  . Smoking status: Never Smoker  . Smokeless tobacco: Never Used  Vaping Use  . Vaping Use: Never used  Substance Use Topics  . Alcohol use: Not Currently  . Drug use: Not Currently    Home Medications Prior to Admission medications   Medication Sig Start Date End Date Taking? Authorizing Provider  ibuprofen (ADVIL,MOTRIN) 200 MG tablet Take 200 mg by mouth every 6 (six) hours as needed.   Yes [provider]    Allergies    Patient has no known allergies.  Review of Systems   Review of Systems  Constitutional: Negative for chills and fever.  HENT: Negative for congestion, rhinorrhea and sore throat.   Eyes: Negative.   Respiratory: Negative for chest tightness and shortness of breath.   Cardiovascular: Negative for chest pain.  Gastrointestinal: Negative for abdominal pain, diarrhea, nausea and vomiting.  Genitourinary: Negative.  Negative for dysuria and vaginal discharge.  Musculoskeletal: Positive for myalgias. Negative for arthralgias, joint swelling and neck pain.  Skin: Positive for rash. Negative for wound.       Per H PI, chronic rash right buttock.  Neurological: Negative for dizziness, weakness, light-headedness, numbness and headaches.  Psychiatric/Behavioral: Negative.  Physical Exam Updated Vital Signs BP 119/76 (BP Location: Left Arm)   Pulse (!) 107   Temp 99.3 F (37.4 C) (Oral)   Resp 19   Ht 5\' 5"  (1.651 m)   Wt 122.5 kg   SpO2 100%   BMI 44.93 kg/m   Physical Exam Vitals and nursing note reviewed.  Constitutional:      Appearance: She is well-developed. She is obese.  HENT:     Head: Normocephalic and atraumatic.     Mouth/Throat:     Mouth: Mucous membranes are  moist.  Eyes:     General: No scleral icterus.    Conjunctiva/sclera: Conjunctivae normal.  Cardiovascular:     Rate and Rhythm: Regular rhythm. Tachycardia present.     Heart sounds: Normal heart sounds.  Pulmonary:     Effort: Pulmonary effort is normal.     Breath sounds: Normal breath sounds. No wheezing.  Abdominal:     General: Bowel sounds are normal.     Palpations: Abdomen is soft.     Tenderness: There is no abdominal tenderness. There is no guarding or rebound.  Musculoskeletal:        General: Normal range of motion.     Cervical back: Normal range of motion.  Skin:    General: Skin is warm and dry.     Findings: Lesion present.     Comments: Non tender lesion right buttock per imaging [facility] note.  No induration.  Neurological:     General: No focal deficit present.     Mental Status: She is alert.     ED Results / Procedures / Treatments   Labs (all labs ordered are listed, but only abnormal results are displayed) Labs Reviewed  URINALYSIS, ROUTINE W REFLEX MICROSCOPIC - Abnormal; Notable for the following components:      Result Value   APPearance HAZY (*)    Hgb urine dipstick MODERATE (*)    Protein, ur 100 (*)    Bacteria, UA RARE (*)    All other components within normal limits  CK - Abnormal; Notable for the following components:   Total CK 31,230 (*)    All other components within normal limits  CBC WITH DIFFERENTIAL/PLATELET - Abnormal; Notable for the following components:   RBC 5.14 (*)    Abs Immature Granulocytes 0.11 (*)    All other components within normal limits  COMPREHENSIVE METABOLIC PANEL - Abnormal; Notable for the following components:   Sodium 132 (*)    Glucose, Bld 104 (*)    Calcium 8.2 (*)    Total Protein 6.3 (*)    Albumin 3.1 (*)    AST 528 (*)    ALT 142 (*)    Total Bilirubin 0.1 (*)    All other components within normal limits  SALICYLATE LEVEL - Abnormal; Notable for the following components:   Salicylate Lvl <7.0  (*)    All other components within normal limits  ACETAMINOPHEN LEVEL - Abnormal; Notable for the following components:   Acetaminophen (Tylenol), Serum <10 (*)    All other components within normal limits  C-REACTIVE PROTEIN - Abnormal; Notable for the following components:   CRP 3.3 (*)    All other components within normal limits  RESP PANEL BY RT-PCR (FLU A&B, COVID) ARPGX2  PREGNANCY, URINE  TSH  SEDIMENTATION RATE  HEPATITIS PANEL, ACUTE  CK  PROCALCITONIN  PROCALCITONIN  GC/CHLAMYDIA PROBE AMP (Metaline) NOT AT [facility]    EKG EKG Interpretation  Date/Time:  Sunday January 18 2020 17:30:45 EST Ventricular Rate:  113 PR Interval:    QRS Duration: 75 QT Interval:  311 QTC Calculation: 427 R Axis:   85 Text Interpretation: Sinus tachycardia Right atrial enlargement Borderline Q waves in lateral leads ST elev, probable normal early repol pattern Baseline wander in lead(s) V5 No old tracing to compare Confirmed by Eber HongMiller, Brian ([id number]) on 01/18/2020 6:15:16 PM   Radiology CT ABDOMEN PELVIS W CONTRAST  Result Date: 01/18/2020 CLINICAL DATA:  Body aches. EXAM: CT ABDOMEN AND PELVIS WITH CONTRAST TECHNIQUE: Multidetector CT imaging of the abdomen and pelvis was performed using the standard protocol following bolus administration of intravenous contrast. CONTRAST:  100mL OMNIPAQUE IOHEXOL 300 MG/ML  SOLN COMPARISON:  None. FINDINGS: Lower chest: No acute abnormality. Hepatobiliary: No focal liver abnormality is seen. No gallstones, gallbladder wall thickening, or biliary dilatation. Pancreas: Unremarkable. No pancreatic ductal dilatation or surrounding inflammatory changes. Spleen: Normal in size without focal abnormality. Adrenals/Urinary Tract: Adrenal glands are unremarkable. Kidneys are normal, without renal calculi, focal lesion, or hydronephrosis. Bladder is unremarkable. Stomach/Bowel: Stomach is within normal limits. Appendix appears normal. No evidence of bowel wall  thickening, distention, or inflammatory changes. Vascular/Lymphatic: No significant vascular findings are present. No enlarged abdominal or pelvic lymph nodes. Reproductive: Uterus is normal in appearance. Multiple ill-defined bilateral subcentimeter ovarian cysts are seen. Other: No abdominal wall hernia or abnormality. No abdominopelvic ascites. Mild, bilateral nonspecific mesenteric inflammatory fat stranding is seen along the posterior aspects of the mid to lower abdomen. A mild amount of diffuse pelvic inflammatory fat stranding is seen. Musculoskeletal: Mild subcutaneous inflammatory fat stranding is seen along the lateral aspect of the gluteal region on the right. No acute or significant osseous findings. IMPRESSION: 1. Mild, nonspecific bilateral posterior abdominal and pelvic inflammatory fat stranding which may represent sequelae associated with mild mesenteritis and/or pelvic inflammatory disease. Electronically Signed   By: Aram Candelahaddeus  Houston M.D.   On: 01/18/2020 20:38   DG Chest Portable 1 View  Result Date: 01/18/2020 CLINICAL DATA:  Fever and body aches. EXAM: PORTABLE CHEST 1 VIEW COMPARISON:  Jul 11, 2013 FINDINGS: The heart size and mediastinal contours are within normal limits. Both lungs are clear. The visualized skeletal structures are unremarkable. IMPRESSION: No active disease. Electronically Signed   By: Aram Candelahaddeus  Houston M.D.   On: 01/18/2020 18:21    Procedures Procedures (including critical care time)  Medications Ordered in ED Medications  cefTRIAXone (ROCEPHIN) 1 g in sodium chloride 0.9 % 100 mL IVPB (1 g Intravenous New Bag/Given 01/18/20 2259)  doxycycline (VIBRAMYCIN) 100 mg in sodium chloride 0.9 % 250 mL IVPB (has no administration in time range)  metroNIDAZOLE (FLAGYL) IVPB 500 mg (has no administration in time range)  morphine 2 MG/ML injection 2 mg (2 mg Intravenous Given 01/18/20 2259)  feeding supplement (ENSURE ENLIVE / ENSURE PLUS) liquid 237 mL (has no  administration in time range)  sodium chloride 0.9 % bolus 1,000 mL (0 mLs Intravenous Stopped 01/18/20 2034)  ketorolac (TORADOL) 30 MG/ML injection 15 mg (15 mg Intravenous Given 01/18/20 2028)  sodium chloride 0.9 % bolus 1,000 mL (0 mLs Intravenous Stopped 01/18/20 2252)  iohexol (OMNIPAQUE) 300 MG/ML solution 100 mL (100 mLs Intravenous Contrast Given 01/18/20 2003)  0.9 %  sodium chloride infusion ( Intravenous New Bag/Given 01/18/20 2258)    ED Course  I have reviewed the triage vital signs and the nursing notes.  Pertinent labs & imaging results that were available during my care of the patient  were reviewed by me and considered in my medical decision making (see chart for details).    MDM Rules/Calculators/A&P                          Patient's labs and imaging reviewed and discussed with her and her mother at the bedside.  She has rhabdomyolysis of unclear etiology along with acute hepatitis.  Further inquiry made regarding family history, no recognized family history of autoimmune or chronic inflammatory conditions.  Patient will need admission for supportive care and further evaluation.  She was given 2 L of IV fluid while here.  Discussed with Dr. Thomes Dinning who accepts patient for admission.   Final Clinical Impression(s) / ED Diagnoses Final diagnoses:  Non-traumatic rhabdomyolysis  Hepatitis    Rx / DC Orders ED Discharge Orders    None       Victoriano Lain 01/18/20 2318    Eber Hong, MD 01/19/20 [phone]

## 2020-01-18 NOTE — ED Triage Notes (Signed)
Pt to er, pt states that she has body aches for the past week, states that all of her muscles feel tight and sore, states that she was seen at urgent care and sent here for an elevated heart rate.

## 2020-01-18 NOTE — ED Notes (Signed)
EKG done and handed to Dr. Hyacinth Meeker

## 2020-01-18 NOTE — Discharge Instructions (Addendum)
Go immediately to the [facility] emergency department for comprehensive evaluation, pain management.  I suspect that you are very sick and we need to figure out what the cause is.  Let them know if things change or if you get worse.  We will call them and let them know that you are on your way.

## 2020-01-18 NOTE — ED Triage Notes (Signed)
Pt presents with c/o body aches and fever. Pt states that body aches began Thursday, she had negative covid, flu and rsv yesterday

## 2020-01-18 NOTE — H&P (Addendum)
History and Physical  Haley Sandoval XHF:[id number] DOB: 02-May-2000 DOA: 01/18/2020  Referring physician: Evalee Jefferson, PA-C PCP: Halford Chessman, MD  Patient coming from: Home  Chief Complaint: Generalized body aches  HPI: Haley Sandoval is a 19 y.o. female obese with medical history significant for seasonal allergies who presents to the emergency department due to 11-day onset of generalized body aches.  Patient states that she had a rash on her buttock and she went to see her PCP who prescribed her with mupirocin and an antibiotic (name unknown), last dose of antibiotic was on Friday 11/19, symptoms started on 11/18 while at work, initially with aches only in left hand and shoulder, but this has since progressed to generalized body aches.  She took Tylenol on Friday (11/26) morning (2 tablets of 650 mg) and repeated same dose in the evening of same day with no improvement, so she stopped taking the Tylenol.  She presented to an urgent care earlier today where she was noted to be febrile and diaphoretic, so she was transferred to the ED for further evaluation and management.  Patient denies any trauma, fall or any known musculoskeletal/immunological disorder in the family.  She denies nausea, vomiting, chest pain, shortness of breath, abdominal pain.  She denies use of any other over-the-counter medication other than Tylenol or use of any natural herbs.  ED Course:  In the emergency department, on arrival to the ED with a temperature of 100.1F, patient was tachycardic and tachypneic.  Work-up in the ED, showed normal CBC and BMP except for mild hyponatremia, hypoalbuminemia, elevated liver enzymes, CK 31,230, CRP 3.3, ESR 15, acetaminophen level < 10, salicylate level < 7.0, urinalysis was unimpressive for UTI.  Last menstrual period was unknown due to persistent use of OCPs.  She is sexually active, though she states that she has not had any recent intercourse. CT abdomen and pelvis with contrast  showed mild, nonspecific bilateral posterior abdominal and pelvic inflammatory fat stranding which may represent sequelae associated with mild mesenteritis and/or pelvic inflammatory disease. Chest x-ray showed no active disease Patient was empirically started on IV ceftriaxone, doxycycline and metronidazole, IV Toradol was given.  Hospitalist was asked to admit patient for further evaluation and management  Review of Systems: Constitutional: Negative for chills and fever.  HENT: Negative for ear pain and sore throat.   Eyes: Negative for pain and visual disturbance.  Respiratory: Negative for cough, chest tightness and shortness of breath.   Cardiovascular: Negative for chest pain and palpitations.  Gastrointestinal: Negative for abdominal pain and vomiting.  Endocrine: Negative for polyphagia and polyuria.  Genitourinary: Negative for decreased urine volume, dysuria, enuresis Musculoskeletal: Positive for generalized body aches.   Skin: Negative for color change and rash.  Allergic/Immunologic: Negative for immunocompromised state.  Neurological: Negative for tremors, syncope, speech difficulty, weakness, light-headedness and headaches.  Hematological: Does not bruise/bleed easily.  All other systems reviewed and are negative    Past Medical History:  Diagnosis Date   Seasonal allergies    History reviewed. No pertinent surgical history.  Social History:  reports that she has never smoked. She has never used smokeless tobacco. She reports previous alcohol use. She reports previous drug use.   No Known Allergies  History reviewed. No pertinent family history.   Prior to Admission medications   Medication Sig Start Date End Date Taking? Authorizing Provider  ibuprofen (ADVIL,MOTRIN) 200 MG tablet Take 200 mg by mouth every 6 (six) hours as needed.    Provider,  MD  ° ° °Physical Exam: °BP 128/75 (BP Location: Left Arm)    Pulse (!) 106    Temp 99.3 °F (37.4 °C)  (Oral)    Resp (!) 21    Ht 5' 5" (1.651 m)    Wt 122.5 kg    SpO2 100%    BMI 44.93 kg/m²  ° °• General: 18 y.o. year-old female obese, well developed  in no acute distress.  Alert and oriented x3. °• HEENT: NCAT, EOMI °• Neck: Supple, trachea medial °• Cardiovascular: Tachycardia.  Regular rate and rhythm with no rubs or gallops.  No thyromegaly or JVD noted.  No lower extremity edema. 2/4 pulses in all 4 extremities. °• Respiratory: Tachypnea clear to auscultation with no wheezes or rales. Good inspiratory effort. °• Abdomen: Soft nontender nondistended with normal bowel sounds x4 quadrants. °• Muskuloskeletal: No cyanosis, clubbing or edema noted bilaterally °• Neuro: CN II-XII intact, strength, sensation, reflexes °• Skin: Nontender black eschar with surrounding mild erythema noted on right buttock.  Skin intact with no ulcerative lesions noted °• Psychiatry: Judgement and insight appear normal. Mood is appropriate for condition and setting °   °   °   °Labs on Admission:  °Basic Metabolic Panel: °Recent Labs  °Lab 01/18/20 °1830  °NA 132*  °K 4.1  °CL 99  °CO2 26  °GLUCOSE 104*  °BUN 10  °CREATININE 0.65  °CALCIUM 8.2*  ° °Liver Function Tests: °Recent Labs  °Lab 01/18/20 °1830  °AST 528*  °ALT 142*  °ALKPHOS 50  °BILITOT 0.1*  °PROT 6.3*  °ALBUMIN 3.1*  ° °No results for input(s): LIPASE, AMYLASE in the last 168 hours. °No results for input(s): AMMONIA in the last 168 hours. °CBC: °Recent Labs  °Lab 01/18/20 °1830  °WBC 8.1  °NEUTROABS 5.7  °HGB 14.8  °HCT 45.3  °MCV 88.1  °PLT 300  ° °Cardiac Enzymes: °Recent Labs  °Lab 01/18/20 °1830  °CKTOTAL 31,230*  ° ° °BNP (last 3 results) °No results for input(s): BNP in the last 8760 hours. ° °ProBNP (last 3 results) °No results for input(s): PROBNP in the last 8760 hours. ° °CBG: °No results for input(s): GLUCAP in the last 168 hours. ° °Radiological Exams on Admission: °CT ABDOMEN PELVIS W CONTRAST ° °Result Date: 01/18/2020 °CLINICAL DATA:  Body aches. EXAM: CT  ABDOMEN AND PELVIS WITH CONTRAST TECHNIQUE: Multidetector CT imaging of the abdomen and pelvis was performed using the standard protocol following bolus administration of intravenous contrast. CONTRAST:  100mL OMNIPAQUE IOHEXOL 300 MG/ML  SOLN COMPARISON:  None. FINDINGS: Lower chest: No acute abnormality. Hepatobiliary: No focal liver abnormality is seen. No gallstones, gallbladder wall thickening, or biliary dilatation. Pancreas: Unremarkable. No pancreatic ductal dilatation or surrounding inflammatory changes. Spleen: Normal in size without focal abnormality. Adrenals/Urinary Tract: Adrenal glands are unremarkable. Kidneys are normal, without renal calculi, focal lesion, or hydronephrosis. Bladder is unremarkable. Stomach/Bowel: Stomach is within normal limits. Appendix appears normal. No evidence of bowel wall thickening, distention, or inflammatory changes. Vascular/Lymphatic: No significant vascular findings are present. No enlarged abdominal or pelvic lymph nodes. Reproductive: Uterus is normal in appearance. Multiple ill-defined bilateral subcentimeter ovarian cysts are seen. Other: No abdominal wall hernia or abnormality. No abdominopelvic ascites. Mild, bilateral nonspecific mesenteric inflammatory fat stranding is seen along the posterior aspects of the mid to lower abdomen. A mild amount of diffuse pelvic inflammatory fat stranding is seen. Musculoskeletal: Mild subcutaneous inflammatory fat stranding is seen along the lateral aspect of the gluteal region on the right. No   No acute or significant osseous findings. IMPRESSION: 1. Mild, nonspecific bilateral posterior abdominal and pelvic inflammatory fat stranding which may represent sequelae associated with mild mesenteritis and/or pelvic inflammatory disease. Electronically Signed   By: Virgina Norfolk M.D.   On: 01/18/2020 20:38   DG Chest Portable 1 View  Result Date: 01/18/2020 CLINICAL DATA:  Fever and body aches. EXAM: PORTABLE CHEST 1 VIEW  COMPARISON:  Jul 11, 2013 FINDINGS: The heart size and mediastinal contours are within normal limits. Both lungs are clear. The visualized skeletal structures are unremarkable. IMPRESSION: No active disease. Electronically Signed   By: Virgina Norfolk M.D.   On: 01/18/2020 18:21    EKG: I independently viewed the EKG done and my findings are as followed: Sinus tachycardia at a rate of 113 bpm  Assessment/Plan Present on Admission: **None**  Principal Problem:   Rhabdomyolysis Active Problems:   Elevated liver enzymes   Acute febrile illness   Hypoalbuminemia   Hyponatremia   Rhabdomyolysis CK 31,230.  Cause of elevated muscle enzymes was unknown at this time She denies multiple trauma, crush injuries, immobilization, seizures, drugs and toxins, alcoholism. Differential diagnosis include extreme exertion (patient reports working double shifts), sickle cell trait, metabolic myopathies, malignant hyperthermia, NMS, mitochondrial myopathies and inflammatory myopathies. ESR was normal, CRP 3.3 TSH was 2.131 Continue IV hydration Continue IV Dilaudid 0.5 mg every 4 hours as needed for moderate/severe pain Continue to monitor muscle enzymes  Acute febrile illness Patient was reported to be febrile and diaphoretic at the urgent care, on arrival to the ED, she was febrile, tachypneic and tachycardic (met SIRS criteria).  There is no known source of infection at this time, though CT abdomen and pelvis with contrast showed mild, nonspecific bilateral posterior abdominal and pelvic inflammatory fat stranding which may represent sequelae associated with mild mesenteritis and/or pelvic inflammatory disease. He was empirically started on IV antibiotics (ceftriaxone, doxycycline and metronidazole); GC chlamydia pending, procalcitonin will be checked with plan to continue with above antibiotics if positive.  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   Consults called: None  Admission status: Inpatient    Bernadette Hoit MD [facility]  01/18/2020, 11:01 PM

## 2020-01-19 ENCOUNTER — Encounter (HOSPITAL_COMMUNITY): Payer: Self-pay | Admitting: Internal Medicine

## 2020-01-19 ENCOUNTER — Ambulatory Visit: Payer: Self-pay

## 2020-01-19 DIAGNOSIS — Z6841 Body Mass Index (BMI) 40.0 and over, adult: Secondary | ICD-10-CM | POA: Diagnosis not present

## 2020-01-19 DIAGNOSIS — R748 Abnormal levels of other serum enzymes: Secondary | ICD-10-CM | POA: Diagnosis not present

## 2020-01-19 DIAGNOSIS — R109 Unspecified abdominal pain: Secondary | ICD-10-CM | POA: Diagnosis not present

## 2020-01-19 DIAGNOSIS — N739 Female pelvic inflammatory disease, unspecified: Secondary | ICD-10-CM | POA: Diagnosis not present

## 2020-01-19 DIAGNOSIS — R509 Fever, unspecified: Secondary | ICD-10-CM | POA: Diagnosis not present

## 2020-01-19 DIAGNOSIS — K759 Inflammatory liver disease, unspecified: Secondary | ICD-10-CM

## 2020-01-19 DIAGNOSIS — M6282 Rhabdomyolysis: Principal | ICD-10-CM

## 2020-01-19 DIAGNOSIS — K5900 Constipation, unspecified: Secondary | ICD-10-CM | POA: Diagnosis not present

## 2020-01-19 DIAGNOSIS — R000 Tachycardia, unspecified: Secondary | ICD-10-CM | POA: Diagnosis not present

## 2020-01-19 DIAGNOSIS — L989 Disorder of the skin and subcutaneous tissue, unspecified: Secondary | ICD-10-CM | POA: Diagnosis not present

## 2020-01-19 DIAGNOSIS — E871 Hypo-osmolality and hyponatremia: Secondary | ICD-10-CM | POA: Diagnosis not present

## 2020-01-19 DIAGNOSIS — E8809 Other disorders of plasma-protein metabolism, not elsewhere classified: Secondary | ICD-10-CM | POA: Diagnosis not present

## 2020-01-19 DIAGNOSIS — E860 Dehydration: Secondary | ICD-10-CM | POA: Diagnosis not present

## 2020-01-19 DIAGNOSIS — Z20822 Contact with and (suspected) exposure to covid-19: Secondary | ICD-10-CM | POA: Diagnosis not present

## 2020-01-19 DIAGNOSIS — N83291 Other ovarian cyst, right side: Secondary | ICD-10-CM | POA: Diagnosis not present

## 2020-01-19 DIAGNOSIS — N83209 Unspecified ovarian cyst, unspecified side: Secondary | ICD-10-CM | POA: Diagnosis not present

## 2020-01-19 LAB — COMPREHENSIVE METABOLIC PANEL
ALT: 135 U/L — ABNORMAL HIGH (ref 0–44)
AST: 484 U/L — ABNORMAL HIGH (ref 15–41)
Albumin: 2.7 g/dL — ABNORMAL LOW (ref 3.5–5.0)
Alkaline Phosphatase: 31 U/L — ABNORMAL LOW (ref 38–126)
Anion gap: 8 (ref 5–15)
BUN: 10 mg/dL (ref 6–20)
CO2: 21 mmol/L — ABNORMAL LOW (ref 22–32)
Calcium: 7.9 mg/dL — ABNORMAL LOW (ref 8.9–10.3)
Chloride: 103 mmol/L (ref 98–111)
Creatinine, Ser: 0.58 mg/dL (ref 0.44–1.00)
GFR, Estimated: >60 mL/min (ref >60)
Glucose, Bld: 100 mg/dL — ABNORMAL HIGH (ref 70–99)
Potassium: 4 mmol/L (ref 3.5–5.1)
Sodium: 132 mmol/L — ABNORMAL LOW (ref 135–145)
Total Bilirubin: 0.3 mg/dL (ref 0.3–1.2)
Total Protein: 5.7 g/dL — ABNORMAL LOW (ref 6.5–8.1)

## 2020-01-19 LAB — CBC
HCT: 41.7 % (ref 36.0–46.0)
Hemoglobin: 13.7 g/dL (ref 12.0–15.0)
MCH: 28.8 pg (ref 26.0–34.0)
MCHC: 32.9 g/dL (ref 30.0–36.0)
MCV: 87.6 fL (ref 80.0–100.0)
Platelets: 250 10*3/uL (ref 150–400)
RBC: 4.76 MIL/uL (ref 3.87–5.11)
RDW: 14.7 % (ref 11.5–15.5)
WBC: 6.8 10*3/uL (ref 4.0–10.5)
nRBC: 0 % (ref 0.0–0.2)

## 2020-01-19 LAB — CK: Total CK: 24057 U/L — ABNORMAL HIGH (ref 38–234)

## 2020-01-19 LAB — PROCALCITONIN
Procalcitonin: <0.1 ng/mL
Procalcitonin: <0.1 ng/mL

## 2020-01-19 LAB — MAGNESIUM: Magnesium: 1.9 mg/dL (ref 1.7–2.4)

## 2020-01-19 LAB — PROTIME-INR
INR: 1 (ref 0.8–1.2)
Prothrombin Time: 12.6 seconds (ref 11.4–15.2)

## 2020-01-19 LAB — HEPATITIS PANEL, ACUTE
HCV Ab: NONREACTIVE
Hep A IgM: NONREACTIVE
Hep B C IgM: NONREACTIVE
Hepatitis B Surface Ag: NONREACTIVE

## 2020-01-19 LAB — APTT: aPTT: 33 seconds (ref 24–36)

## 2020-01-19 LAB — HIV ANTIBODY (ROUTINE TESTING W REFLEX): HIV Screen 4th Generation wRfx: NONREACTIVE

## 2020-01-19 LAB — PHOSPHORUS: Phosphorus: 3.5 mg/dL (ref 2.5–4.6)

## 2020-01-19 MED ORDER — METRONIDAZOLE 500 MG PO TABS
500.0000 mg | ORAL_TABLET | Freq: Three times a day (TID) | ORAL | Status: DC
  Administered 2020-01-19 – 2020-01-20 (×4): 500 mg via ORAL
  Filled 2020-01-19 (×4): qty 1

## 2020-01-19 MED ORDER — HYDROMORPHONE HCL 1 MG/ML IJ SOLN
1.0000 mg | INTRAMUSCULAR | Status: DC | PRN
  Administered 2020-01-19 – 2020-01-20 (×4): 1 mg via INTRAVENOUS
  Filled 2020-01-19 (×5): qty 1

## 2020-01-19 MED ORDER — ONDANSETRON HCL 4 MG/2ML IJ SOLN
4.0000 mg | Freq: Four times a day (QID) | INTRAMUSCULAR | Status: DC | PRN
  Administered 2020-01-19 – 2020-01-20 (×2): 4 mg via INTRAVENOUS
  Filled 2020-01-19 (×4): qty 2

## 2020-01-19 MED ORDER — HYDROMORPHONE HCL 1 MG/ML IJ SOLN
0.5000 mg | INTRAMUSCULAR | Status: DC | PRN
  Administered 2020-01-19: 0.5 mg via INTRAVENOUS
  Filled 2020-01-19: qty 1

## 2020-01-19 MED ORDER — SODIUM CHLORIDE 0.9 % IV SOLN: INTRAVENOUS | Status: DC

## 2020-01-19 MED ORDER — SODIUM CHLORIDE 0.9 % IV SOLN: INTRAVENOUS | Status: AC

## 2020-01-19 MED ORDER — DOXYCYCLINE HYCLATE 100 MG PO TABS
100.0000 mg | ORAL_TABLET | Freq: Two times a day (BID) | ORAL | Status: DC
  Administered 2020-01-19 – 2020-01-20 (×3): 100 mg via ORAL
  Filled 2020-01-19 (×3): qty 1

## 2020-01-19 MED ORDER — ENOXAPARIN SODIUM 40 MG/0.4ML ~~LOC~~ SOLN: 40.0000 mg | SUBCUTANEOUS | Status: DC

## 2020-01-19 MED ORDER — METHOCARBAMOL 1000 MG/10ML IJ SOLN
500.0000 mg | Freq: Three times a day (TID) | INTRAVENOUS | Status: DC | PRN
  Administered 2020-01-19 – 2020-01-21 (×2): 500 mg via INTRAVENOUS
  Filled 2020-01-19: qty 500

## 2020-01-19 NOTE — Consult Note (Signed)
Referring Provider: Evalee Jefferson, [facility] Primary Care Physician:  Halford Chessman, MD Primary Gastroenterologist:  Dr. Abbey Chatters  Date of Admission: 01/18/20 Date of Consultation: 01/19/20  Reason for Consultation:  Elevated LFTs   HPI:  Haley Sandoval is a 19 y.o. year old female presenting to the ED with severe body aches for the past 10 days, found to have rhabdomyolysis of unclear etiology with CK initially 31,230 and now 24,057 this morning. Elevated LFTs with AST 528, ALT 142, Alk Phos and Tbili normal, now improved to AST 484 and ALT 135 this morning. Acute hepatitis panel pending. CT abd/pelvis with contrast with mild, nonspecific bilateral posterior abdominal and pelvic inflammatory stranding. Liver without abnormality. INR normal at 1.0.    Notes acute onset of severe muscle aches about 10 days ago. [employer] double shifts the week prior to Thanksgiving. No excessive labor, sweating, exercise. No alcohol or drug use. Took 650 mg tylenol, 2 capsules on 2 different occasions, last taken on Friday. Ibuprofen rare for headaches but none recently. No OTC herbs or supplements. Had been on doxycycline starting 11/10 with plans for 10 days  possible buttock furuncle. Towards end of taking this, noted muscle aches. Made her nauseated feeling, so she didn't take last dose last Friday before Thanksgiving. Tried Epsom salt baths. Biofreeze. Depo shot10/06/2019. Fever max 100.7 yesterday. Hungry now.    Was supposed to start with [employer] today. Now without a job per patient.   Past Medical History:  Diagnosis Date  . Seasonal allergies     Past Surgical History:  Procedure Laterality Date  . WISDOM TOOTH EXTRACTION      Prior to Admission medications   Medication Sig Start Date End Date Taking? Authorizing Provider  ibuprofen (ADVIL,MOTRIN) 200 MG tablet Take 200 mg by mouth every 6 (six) hours as needed.   Yes [provider]    Current Facility-Administered Medications   Medication Dose Route Frequency Provider Last Rate Last Admin  . 0.9 %  sodium chloride infusion   Intravenous Continuous Barton Dubois, MD 150 mL/hr at 01/19/20 0915 New Bag at 01/19/20 0915  . doxycycline (VIBRA-TABS) tablet 100 mg  100 mg Oral Q12H Barton Dubois, MD   100 mg at 01/19/20 0943  . enoxaparin (LOVENOX) injection 60 mg  60 mg Subcutaneous Q24H Laren Everts, RPH   60 mg at 01/19/20 0943  . feeding supplement (ENSURE ENLIVE / ENSURE PLUS) liquid 237 mL  237 mL Oral BID BM Adefeso, Oladapo, DO      . HYDROmorphone (DILAUDID) injection 1 mg  1 mg Intravenous Q4H PRN Barton Dubois, MD      . methocarbamol (ROBAXIN) 500 mg in dextrose 5 % 50 mL IVPB  500 mg Intravenous Q8H PRN Barton Dubois, MD      . metroNIDAZOLE (FLAGYL) tablet 500 mg  500 mg Oral Q8H Barton Dubois, MD   500 mg at 01/19/20 1610    Allergies as of 01/18/2020  . (No Known Allergies)    Family History  Problem Relation Age of Onset  . Colon cancer Neg Hx   . Colon polyps Neg Hx   . Liver disease Neg Hx   . Sickle cell anemia Neg Hx     Social History   Socioeconomic History  . Marital status: Single    Spouse name: Not on file  . Number of children: Not on file  . Years of education: Not on file  . Highest education level: Not on file  Occupational History  . Not on file  Tobacco Use  . Smoking status: Never Smoker  . Smokeless tobacco: Never Used  Vaping Use  . Vaping Use: Never used  Substance and Sexual Activity  . Alcohol use: Not Currently  . Drug use: Not Currently  . Sexual activity: Yes  Other Topics Concern  . Not on file  Social History Narrative  . Not on file   Social Determinants of Health   Financial Resource Strain:   . Difficulty of Paying Living Expenses: Not on file  Food Insecurity: No Food Insecurity  . Worried About Charity fundraiser in the Last Year: Never true  . Ran Out of Food in the Last Year: Never true  Transportation Needs: No Transportation Needs   . Lack of Transportation (Medical): No  . Lack of Transportation (Non-Medical): No  Physical Activity:   . Days of Exercise per Week: Not on file  . Minutes of Exercise per Session: Not on file  Stress:   . Feeling of Stress : Not on file  Social Connections:   . Frequency of Communication with Friends and Family: Not on file  . Frequency of Social Gatherings with Friends and Family: Not on file  . Attends Religious Services: Not on file  . Active Member of Clubs or Organizations: Not on file  . Attends Archivist Meetings: Not on file  . Marital Status: Not on file  Intimate Partner Violence:   . Fear of Current or Ex-Partner: Not on file  . Emotionally Abused: Not on file  . Physically Abused: Not on file  . Sexually Abused: Not on file    Review of Systems: Gen: see HPI CV: Denies chest pain, heart palpitations, syncope, edema  Resp: Denies shortness of breath with rest, cough, wheezing GI: see HPI GU : Denies urinary burning, urinary frequency, urinary incontinence.  MS: see HPI Derm: Denies rash, itching, dry skin Psych: Denies depression, anxiety,confusion, or memory loss Heme: Denies bruising, bleeding, and enlarged lymph nodes.  Physical Exam: Vital signs in last 24 hours: Temp:  [98.9 F (37.2 C)-100.7 F (38.2 C)] 99.3 F (37.4 C) (11/29 1046) Pulse Rate:  [87-135] 91 (11/29 1046) Resp:  [16-25] 20 (11/29 1046) BP: (103-140)/(62-100) 134/89 (11/29 1046) SpO2:  [98 %-100 %] 100 % (11/29 1046) Weight:  [122.5 kg] 122.5 kg (11/28 1659)   General:   Alert,  Well-developed, well-nourished, pleasant and cooperative in NAD Head:  Normocephalic and atraumatic. Eyes:  Sclera clear, no icterus.   Conjunctiva pink. Ears:  Normal auditory acuity. Nose:  No deformity, discharge,  or lesions. Mouth:  No deformity or lesions, dentition normal. Lungs:  Clear throughout to auscultation.   No wheezes, crackles, or rhonchi. No acute distress. Heart:  S1 S2  present without murmurs Abdomen:  Soft, nontender and nondistended. No masses, hepatosplenomegaly or hernias noted. Normal bowel sounds, without guarding, and without rebound.   Rectal:  Deferred  Msk:  Symmetrical without gross deformities. Normal posture. Pulses:  Normal pulses noted. Extremities:  Without edema. Neurologic:  Alert and  oriented x4 Psych:  Alert and cooperative. Normal mood and affect.  Intake/Output from previous day: 11/28 0701 - 11/29 0700 In: 2000 [IV Piggyback:2000] Out: -  Intake/Output this shift: No intake/output data recorded.  Lab Results: Recent Labs    01/18/20 1830 01/19/20 0508  WBC 8.1 6.8  HGB 14.8 13.7  HCT 45.3 41.7  PLT 300 250   BMET Recent Labs    01/18/20  1830 01/19/20 0508  NA 132* 132*  K 4.1 4.0  CL 99 103  CO2 26 21*  GLUCOSE 104* 100*  BUN 10 10  CREATININE 0.65 0.58  CALCIUM 8.2* 7.9*   LFT Recent Labs    01/18/20 1830 01/19/20 0508  PROT 6.3* 5.7*  ALBUMIN 3.1* 2.7*  AST 528* 484*  ALT 142* 135*  ALKPHOS 50 31*  BILITOT 0.1* 0.3   PT/INR Recent Labs    01/19/20 0508  LABPROT 12.6  INR 1.0   Lab Results  Component Value Date   CKTOTAL 24,057 (H) 01/19/2020    Studies/Results: CT ABDOMEN PELVIS W CONTRAST  Result Date: 01/18/2020 CLINICAL DATA:  Body aches. EXAM: CT ABDOMEN AND PELVIS WITH CONTRAST TECHNIQUE: Multidetector CT imaging of the abdomen and pelvis was performed using the standard protocol following bolus administration of intravenous contrast. CONTRAST:  148m OMNIPAQUE IOHEXOL 300 MG/ML  SOLN COMPARISON:  None. FINDINGS: Lower chest: No acute abnormality. Hepatobiliary: No focal liver abnormality is seen. No gallstones, gallbladder wall thickening, or biliary dilatation. Pancreas: Unremarkable. No pancreatic ductal dilatation or surrounding inflammatory changes. Spleen: Normal in size without focal abnormality. Adrenals/Urinary Tract: Adrenal glands are unremarkable. Kidneys are normal,  without renal calculi, focal lesion, or hydronephrosis. Bladder is unremarkable. Stomach/Bowel: Stomach is within normal limits. Appendix appears normal. No evidence of bowel wall thickening, distention, or inflammatory changes. Vascular/Lymphatic: No significant vascular findings are present. No enlarged abdominal or pelvic lymph nodes. Reproductive: Uterus is normal in appearance. Multiple ill-defined bilateral subcentimeter ovarian cysts are seen. Other: No abdominal wall hernia or abnormality. No abdominopelvic ascites. Mild, bilateral nonspecific mesenteric inflammatory fat stranding is seen along the posterior aspects of the mid to lower abdomen. A mild amount of diffuse pelvic inflammatory fat stranding is seen. Musculoskeletal: Mild subcutaneous inflammatory fat stranding is seen along the lateral aspect of the gluteal region on the right. No acute or significant osseous findings. IMPRESSION: 1. Mild, nonspecific bilateral posterior abdominal and pelvic inflammatory fat stranding which may represent sequelae associated with mild mesenteritis and/or pelvic inflammatory disease. Electronically Signed   By: TVirgina NorfolkM.D.   On: 01/18/2020 20:38   DG Chest Portable 1 View  Result Date: 01/18/2020 CLINICAL DATA:  Fever and body aches. EXAM: PORTABLE CHEST 1 VIEW COMPARISON:  Jul 11, 2013 FINDINGS: The heart size and mediastinal contours are within normal limits. Both lungs are clear. The visualized skeletal structures are unremarkable. IMPRESSION: No active disease. Electronically Signed   By: TVirgina NorfolkM.D.   On: 01/18/2020 18:21    Impression: Very pleasant 19year old female presenting with 10 day history of severe muscle aches, reporting working double shifts at [employer] and found to have elevated CK and elevated transaminases on admission. She is without abdominal pain, denies any OTC herbal supplements/meds, isolated Tylenol use, no ETOH, and only medication as outpatient  prior to onset was doxycycline for presumed buttock furuncle.   Transaminases improved slightly since admission, INR is normal, and likely LFTs elevated in setting of rhabdomyolysis. Awaiting viral hepatitis panel to return. Will continue to trend LFTs for now.   Plan: Follow [facility] Await viral hepatitis serologies Continue supportive measures Regular diet Will continue to follow with you   AAnnitta Needs PhD, ANP-BC [facility]     LOS: 0 days    01/19/2020, 11:04 AM

## 2020-01-19 NOTE — ED Notes (Signed)
Unable to administer scheduled Doxycycline IV and Flagyl IV due to items not available in pyxis. Pharmacy notified

## 2020-01-19 NOTE — Progress Notes (Signed)
PROGRESS NOTE    Haley Sandoval  MCN:[id number] DOB: Nov 12, 2000 DOA: 01/18/2020 PCP: Halford Chessman, MD   Chief Complaint  Patient presents with  . Generalized Body Aches    Brief Narrative:  As per H&P written by Dr. Josephine Cables on 01/18/2020 Haley Sandoval is a 19 y.o. female obese with medical history significant for seasonal allergies who presents to the emergency department due to 11-day onset of generalized body aches.  Patient states that she had a rash on her buttock and she went to see her PCP who prescribed her with mupirocin and an antibiotic (name unknown), last dose of antibiotic was on Friday 11/19, symptoms started on 11/18 while at work, initially with aches only in left hand and shoulder, but this has since progressed to generalized body aches.  She took Tylenol on Friday (11/26) morning (2 tablets of 650 mg) and repeated same dose in the evening of same day with no improvement, so she stopped taking the Tylenol.  She presented to an urgent care earlier today where she was noted to be febrile and diaphoretic, so she was transferred to the ED for further evaluation and management.  Patient denies any trauma, fall or any known musculoskeletal/immunological disorder in the family.  She denies nausea, vomiting, chest pain, shortness of breath, abdominal pain.  She denies use of any other over-the-counter medication other than Tylenol or use of any natural herbs.  ED Course:  In the emergency department, on arrival to the ED with a temperature of 100.63F, patient was tachycardic and tachypneic.  Work-up in the ED, showed normal CBC and BMP except for mild hyponatremia, hypoalbuminemia, elevated liver enzymes, CK 31,230, CRP 3.3, ESR 15, acetaminophen level < 10, salicylate level < 7.0, urinalysis was unimpressive for UTI.  Last menstrual period was unknown due to persistent use of OCPs.  She is sexually active, though she states that she has not had any recent intercourse. CT abdomen and  pelvis with contrast showed mild, nonspecific bilateral posterior abdominal and pelvic inflammatory fat stranding which may represent sequelae associated with mild mesenteritis and/or pelvic inflammatory disease. Chest x-ray showed no active disease Patient was empirically started on IV ceftriaxone, doxycycline and metronidazole, IV Toradol was given.  Hospitalist was asked to admit patient for further evaluation and management  Assessment & Plan: 1-nontraumatic rhabdomyolysis -CK level still high -Complaining of generalized myalgia -Continue aggressive fluid resuscitation -Patient advised to maintain adequate oral hydration as well. -Continue to follow CK levels; provide muscle relaxants.  2-Elevated liver enzymes -Most likely associated with rhabdomyolysis process -Hepatitis panel negative -Continue IV fluid -Follow trend. -Appreciate assistance and recommendation by GI service.  3-Acute febrile illness -With concern for pelvic inflammatory disease -Continue the use of doxycycline and Flagyl. -Maintain adequate hydration -Continue as needed antipyretics.  4-Hyponatremia -In the setting of dehydration -Continue IV fluid -Follow sodium trend  5-Obesity, Class III, BMI 40-49.9 (morbid obesity) (HCC) -Body mass index is 44.93 kg/m. -Low calorie diet, portion control increase physical activity discussed with patient.   DVT prophylaxis: Lovenox Code Status: Full code. Family Communication: Mother at bedside Disposition:   Status is: Inpatient  Dispo: The patient is from: Home              Anticipated d/c is to: Home              Anticipated d/c date is: 1-2 days.              Patient currently CK level is still elevated  and LFTs even improvement is still high.  Complaining of generalized myalgias.  Continue aggressive fluid resuscitation, pain management.    Consultants:   Gastroenterology service  Procedures:  See below for x-ray report.  Antimicrobials:   Patient is receiving Doxycycline and Flagyl  Subjective: Afebrile currently; no chest pain, no nausea, no vomiting.  Continues to complain of generalized myalgia.  Low-grade temperature overnight.  Objective: Vitals:   01/19/20 0945 01/19/20 0949 01/19/20 1046 01/19/20 1507  BP: (!) 135/100  134/89 (!) 139/98  Pulse: (!) 105  91 100  Resp: _0 Temp:  98.9 F (37.2 C) 99.3 F (37.4 C) 97.8 F (36.6 C)  TempSrc:  Oral Oral Oral  SpO2: 100%  100% 100%  Weight:      Height:        Intake/Output Summary (Last 24 hours) at 01/19/2020 1710 Last data filed at 01/19/2020 1537 Gross per 24 hour  Intake 2895.72 ml  Output --  Net 2895.72 ml   Filed Weights   01/18/20 1659  Weight: 122.5 kg    Examination:  General exam: Afebrile, complaining of generalized muscular pain; no nausea, no vomiting. Respiratory system: Clear to auscultation. Respiratory effort normal.  No requiring oxygen supplementation. Cardiovascular system: Mild sinus tachycardia appreciated on examination; regular rhythm; no rubs, no gallops, unable to properly assess JVD with body habitus.   Gastrointestinal system: Abdomen is obese, nondistended, soft and nontender. No organomegaly or masses felt. Normal bowel sounds heard. Central nervous system: Alert and oriented. No focal neurological deficits. Extremities: No cyanosis or clubbing. Skin: No petechiae. Psychiatry: Judgement and insight appear normal. Mood & affect appropriate.     Data Reviewed: I have personally reviewed following labs and imaging studies  CBC: Recent Labs  Lab 01/18/20 1830 01/19/20 0508  WBC 8.1 6.8  NEUTROABS 5.7  --   HGB 14.8 13.7  HCT 45.3 41.7  MCV 88.1 87.6  PLT 300 211    Basic Metabolic Panel: Recent Labs  Lab 01/18/20 1830 01/19/20 0508  NA 132* 132*  K 4.1 4.0  CL 99 103  CO2 26 21*  GLUCOSE 104* 100*  BUN 10 10  CREATININE 0.65 0.58  CALCIUM 8.2* 7.9*  MG  --  1.9  PHOS  --  3.5     GFR: Estimated Creatinine Clearance: 147.3 mL/min (by C-G formula based on SCr of 0.58 mg/dL).  Liver Function Tests: Recent Labs  Lab 01/18/20 1830 01/19/20 0508  AST 528* 484*  ALT 142* 135*  ALKPHOS 50 31*  BILITOT 0.1* 0.3  PROT 6.3* 5.7*  ALBUMIN 3.1* 2.7*    CBG: No results for input(s): GLUCAP in the last 168 hours.   Recent Results (from the past 240 hour(s))  Resp Panel by RT-PCR (Flu A&B, Covid) Nasopharyngeal Swab     Status: None   Collection Time: 01/18/20  5:57 PM   Specimen: Nasopharyngeal Swab; Nasopharyngeal(NP) swabs in vial transport medium  Result Value Ref Range Status   SARS Coronavirus 2 by RT PCR NEGATIVE NEGATIVE Final    Comment: (NOTE) SARS-CoV-2 target nucleic acids are NOT DETECTED.  The SARS-CoV-2 RNA is generally detectable in upper respiratory specimens during the acute phase of infection. The lowest concentration of SARS-CoV-2 viral copies this assay can detect is 138 copies/mL. A negative result does not preclude SARS-Cov-2 infection and should not be used as the sole basis for treatment or other patient management decisions. A negative result may occur with  improper specimen collection/handling,  submission of specimen other than nasopharyngeal swab, presence of viral mutation(s) within the areas targeted by this assay, and inadequate number of viral copies(<138 copies/mL). A negative result must be combined with clinical observations, patient history, and epidemiological information. The expected result is Negative.  Fact Sheet for Patients:  EntrepreneurPulse.com.au  Fact Sheet for Healthcare Providers:  IncredibleEmployment.be  This test is no t yet approved or cleared by the [place] FDA and  has been authorized for detection and/or diagnosis of SARS-CoV-2 by FDA under an Emergency Use Authorization (EUA). This EUA will remain  in effect (meaning this test can be used) for the  duration of the COVID-19 declaration under Section 564(b)(1) of the Act, 21 U.S.C.section 360bbb-3(b)(1), unless the authorization is terminated  or revoked sooner.       Influenza A by PCR NEGATIVE NEGATIVE Final   Influenza B by PCR NEGATIVE NEGATIVE Final    Comment: (NOTE) The Xpert Xpress SARS-CoV-2/FLU/RSV plus assay is intended as an aid in the diagnosis of influenza from Nasopharyngeal swab specimens and should not be used as a sole basis for treatment. Nasal washings and aspirates are unacceptable for Xpert Xpress SARS-CoV-2/FLU/RSV testing.  Fact Sheet for Patients: EntrepreneurPulse.com.au  Fact Sheet for Healthcare Providers: IncredibleEmployment.be  This test is not yet approved or cleared by the [place] FDA and has been authorized for detection and/or diagnosis of SARS-CoV-2 by FDA under an Emergency Use Authorization (EUA). This EUA will remain in effect (meaning this test can be used) for the duration of the COVID-19 declaration under Section 564(b)(1) of the Act, 21 U.S.C. section 360bbb-3(b)(1), unless the authorization is terminated or revoked.  Performed at [facility], [address], [place], [place] [address]       Radiology Studies: CT ABDOMEN PELVIS W CONTRAST  Result Date: 01/18/2020 CLINICAL DATA:  Body aches. EXAM: CT ABDOMEN AND PELVIS WITH CONTRAST TECHNIQUE: Multidetector CT imaging of the abdomen and pelvis was performed using the standard protocol following bolus administration of intravenous contrast. CONTRAST:  135m OMNIPAQUE IOHEXOL 300 MG/ML  SOLN COMPARISON:  None. FINDINGS: Lower chest: No acute abnormality. Hepatobiliary: No focal liver abnormality is seen. No gallstones, gallbladder wall thickening, or biliary dilatation. Pancreas: Unremarkable. No pancreatic ductal dilatation or surrounding inflammatory changes. Spleen: Normal in size without focal abnormality. Adrenals/Urinary Tract: Adrenal  glands are unremarkable. Kidneys are normal, without renal calculi, focal lesion, or hydronephrosis. Bladder is unremarkable. Stomach/Bowel: Stomach is within normal limits. Appendix appears normal. No evidence of bowel wall thickening, distention, or inflammatory changes. Vascular/Lymphatic: No significant vascular findings are present. No enlarged abdominal or pelvic lymph nodes. Reproductive: Uterus is normal in appearance. Multiple ill-defined bilateral subcentimeter ovarian cysts are seen. Other: No abdominal wall hernia or abnormality. No abdominopelvic ascites. Mild, bilateral nonspecific mesenteric inflammatory fat stranding is seen along the posterior aspects of the mid to lower abdomen. A mild amount of diffuse pelvic inflammatory fat stranding is seen. Musculoskeletal: Mild subcutaneous inflammatory fat stranding is seen along the lateral aspect of the gluteal region on the right. No acute or significant osseous findings. IMPRESSION: 1. Mild, nonspecific bilateral posterior abdominal and pelvic inflammatory fat stranding which may represent sequelae associated with mild mesenteritis and/or pelvic inflammatory disease. Electronically Signed   By: TVirgina NorfolkM.D.   On: 01/18/2020 20:38   DG Chest Portable 1 View  Result Date: 01/18/2020 CLINICAL DATA:  Fever and body aches. EXAM: PORTABLE CHEST 1 VIEW COMPARISON:  Jul 11, 2013 FINDINGS: The heart size and mediastinal contours are  within normal limits. Both lungs are clear. The visualized skeletal structures are unremarkable. IMPRESSION: No active disease. Electronically Signed   By: Virgina Norfolk M.D.   On: 01/18/2020 18:21    Scheduled Meds: . doxycycline  100 mg Oral Q12H  . enoxaparin (LOVENOX) injection  60 mg Subcutaneous Q24H  . feeding supplement  237 mL Oral BID BM  . metroNIDAZOLE  500 mg Oral Q8H   Continuous Infusions: . sodium chloride 150 mL/hr at 01/19/20 1537  . methocarbamol (ROBAXIN) IV Stopped (01/19/20 1324)      LOS: 0 days    Time spent: 30 minutes.    Barton Dubois, MD [facility]   To contact the attending provider between 7A-7P or the covering provider during after hours 7P-7A, please log into the web site [email or web address] and access using universal [place] password for that web site. If you do not have the password, please call the hospital operator.  01/19/2020, 5:10 PM

## 2020-01-20 DIAGNOSIS — R1110 Vomiting, unspecified: Secondary | ICD-10-CM

## 2020-01-20 DIAGNOSIS — K5900 Constipation, unspecified: Secondary | ICD-10-CM

## 2020-01-20 DIAGNOSIS — M6282 Rhabdomyolysis: Secondary | ICD-10-CM | POA: Diagnosis not present

## 2020-01-20 DIAGNOSIS — R748 Abnormal levels of other serum enzymes: Secondary | ICD-10-CM | POA: Diagnosis not present

## 2020-01-20 DIAGNOSIS — K759 Inflammatory liver disease, unspecified: Secondary | ICD-10-CM | POA: Diagnosis not present

## 2020-01-20 LAB — CK: Total CK: 15950 U/L — ABNORMAL HIGH (ref 38–234)

## 2020-01-20 LAB — COMPREHENSIVE METABOLIC PANEL
ALT: 101 U/L — ABNORMAL HIGH (ref 0–44)
AST: 357 U/L — ABNORMAL HIGH (ref 15–41)
Albumin: 2.4 g/dL — ABNORMAL LOW (ref 3.5–5.0)
Alkaline Phosphatase: 28 U/L — ABNORMAL LOW (ref 38–126)
Anion gap: 7 (ref 5–15)
BUN: 9 mg/dL (ref 6–20)
CO2: 22 mmol/L (ref 22–32)
Calcium: 7.9 mg/dL — ABNORMAL LOW (ref 8.9–10.3)
Chloride: 105 mmol/L (ref 98–111)
Creatinine, Ser: 0.62 mg/dL (ref 0.44–1.00)
GFR, Estimated: >60 mL/min (ref >60)
Glucose, Bld: 90 mg/dL (ref 70–99)
Potassium: 4.1 mmol/L (ref 3.5–5.1)
Sodium: 134 mmol/L — ABNORMAL LOW (ref 135–145)
Total Bilirubin: 0.4 mg/dL (ref 0.3–1.2)
Total Protein: 4.9 g/dL — ABNORMAL LOW (ref 6.5–8.1)

## 2020-01-20 LAB — PROCALCITONIN: Procalcitonin: <0.1 ng/mL

## 2020-01-20 MED ORDER — SODIUM CHLORIDE 0.9 % IV SOLN: INTRAVENOUS | Status: AC

## 2020-01-20 MED ORDER — HYDROMORPHONE HCL 1 MG/ML IJ SOLN
1.0000 mg | INTRAMUSCULAR | Status: DC | PRN
  Administered 2020-01-20 – 2020-01-21 (×6): 1 mg via INTRAVENOUS
  Filled 2020-01-20 (×6): qty 1

## 2020-01-20 MED ORDER — METHYLPREDNISOLONE SODIUM SUCC 40 MG IJ SOLR: 40.0000 mg | Freq: Every day | INTRAMUSCULAR | Status: DC

## 2020-01-20 MED ORDER — DOXYCYCLINE HYCLATE 100 MG PO TABS
100.0000 mg | ORAL_TABLET | Freq: Two times a day (BID) | ORAL | Status: DC
  Administered 2020-01-20 – 2020-01-22 (×4): 100 mg via ORAL
  Filled 2020-01-20 (×4): qty 1

## 2020-01-20 MED ORDER — POLYETHYLENE GLYCOL 3350 17 G PO PACK
17.0000 g | PACK | Freq: Every day | ORAL | Status: DC
  Administered 2020-01-20 – 2020-01-21 (×2): 17 g via ORAL
  Filled 2020-01-20 (×3): qty 1

## 2020-01-20 MED ORDER — METHYLPREDNISOLONE SODIUM SUCC 40 MG IJ SOLR
40.0000 mg | Freq: Two times a day (BID) | INTRAMUSCULAR | Status: DC
  Administered 2020-01-20 – 2020-01-22 (×4): 40 mg via INTRAVENOUS
  Filled 2020-01-20 (×4): qty 1

## 2020-01-20 NOTE — Progress Notes (Signed)
Subjective: Had an episode of vomiting yesterday after receiving pain medication. Also had an episode of vomiting this morning after eating breakfast. Had pain medication and flagylaround 5am. Mild nausea just before vomiting, but no lingering nausea. States pain medications are supposed to be getting changed as they are not helping. No associated abdominal pain. Tolerated her lunch well yesterday. Feels lightly constipated. Last BM was Friday. Continues to have generalized body aches without improvement. Notes discomfort and swelling in her left arm that has been present since admission.   Objective: Vital signs in last 24 hours: Temp:  [97.8 F (36.6 C)-99.3 F (37.4 C)] 98.9 F (37.2 C) (11/30 0800) Pulse Rate:  [94-103] 94 (11/30 0800) Resp:  [19-20] 19 (11/30 0800) BP: (104-139)/(67-98) 137/90 (11/30 0800) SpO2:  [100 %] 100 % (11/30 0800) Last BM Date: 01/16/20 General:   Alert and oriented, pleasant, no acute distress Head:  Normocephalic and atraumatic. Eyes:  No icterus, sclera clear. Conjuctiva pink.  Abdomen:  Bowel sounds present, soft, non-tender, non-distended. No HSM or hernias noted. No rebound or guarding. No masses appreciated  Msk:  Symmetrical without gross deformities.  Extremities:  Without edema. Left arm doesn't appear significantly different than right arm. No erythema or TTP.  Neurologic:  Alert and  oriented x4;  grossly normal neurologically. Skin:  Warm and dry, intact without significant lesions.  Psych: Normal mood and affect.  Intake/Output from previous day: 11/29 0701 - 11/30 0700 In: 2430.2 [I.V.:2366.6; IV Piggyback:63.5] Out: -  Intake/Output this shift: Total I/O In: 480 [P.O.:480] Out: -   Lab Results: Recent Labs    01/18/20 1830 01/19/20 0508  WBC 8.1 6.8  HGB 14.8 13.7  HCT 45.3 41.7  PLT 300 250   BMET Recent Labs    01/18/20 1830 01/19/20 0508 01/20/20 0504  NA 132* 132* 134*  K 4.1 4.0 4.1  CL 99 103 105  CO2 26  21* 22  GLUCOSE 104* 100* 90  BUN '10 10 9  ' CREATININE 0.65 0.58 0.62  CALCIUM 8.2* 7.9* 7.9*   LFT Recent Labs    01/18/20 1830 01/19/20 0508 01/20/20 0504  PROT 6.3* 5.7* 4.9*  ALBUMIN 3.1* 2.7* 2.4*  AST 528* 484* 357*  ALT 142* 135* 101*  ALKPHOS 50 31* 28*  BILITOT 0.1* 0.3 0.4   PT/INR Recent Labs    01/19/20 0508  LABPROT 12.6  INR 1.0   Hepatitis Panel Recent Labs    01/18/20 1830  HEPBSAG NON REACTIVE  HCVAB NON REACTIVE  HEPAIGM NON REACTIVE  HEPBIGM NON REACTIVE   Studies/Results: CT ABDOMEN PELVIS W CONTRAST  Result Date: 01/18/2020 CLINICAL DATA:  Body aches. EXAM: CT ABDOMEN AND PELVIS WITH CONTRAST TECHNIQUE: Multidetector CT imaging of the abdomen and pelvis was performed using the standard protocol following bolus administration of intravenous contrast. CONTRAST:  19m OMNIPAQUE IOHEXOL 300 MG/ML  SOLN COMPARISON:  None. FINDINGS: Lower chest: No acute abnormality. Hepatobiliary: No focal liver abnormality is seen. No gallstones, gallbladder wall thickening, or biliary dilatation. Pancreas: Unremarkable. No pancreatic ductal dilatation or surrounding inflammatory changes. Spleen: Normal in size without focal abnormality. Adrenals/Urinary Tract: Adrenal glands are unremarkable. Kidneys are normal, without renal calculi, focal lesion, or hydronephrosis. Bladder is unremarkable. Stomach/Bowel: Stomach is within normal limits. Appendix appears normal. No evidence of bowel wall thickening, distention, or inflammatory changes. Vascular/Lymphatic: No significant vascular findings are present. No enlarged abdominal or pelvic lymph nodes. Reproductive: Uterus is normal in appearance. Multiple ill-defined bilateral subcentimeter ovarian cysts are  seen. Other: No abdominal wall hernia or abnormality. No abdominopelvic ascites. Mild, bilateral nonspecific mesenteric inflammatory fat stranding is seen along the posterior aspects of the mid to lower abdomen. A mild amount of  diffuse pelvic inflammatory fat stranding is seen. Musculoskeletal: Mild subcutaneous inflammatory fat stranding is seen along the lateral aspect of the gluteal region on the right. No acute or significant osseous findings. IMPRESSION: 1. Mild, nonspecific bilateral posterior abdominal and pelvic inflammatory fat stranding which may represent sequelae associated with mild mesenteritis and/or pelvic inflammatory disease. Electronically Signed   By: Virgina Norfolk M.D.   On: 01/18/2020 20:38   DG Chest Portable 1 View  Result Date: 01/18/2020 CLINICAL DATA:  Fever and body aches. EXAM: PORTABLE CHEST 1 VIEW COMPARISON:  Jul 11, 2013 FINDINGS: The heart size and mediastinal contours are within normal limits. Both lungs are clear. The visualized skeletal structures are unremarkable. IMPRESSION: No active disease. Electronically Signed   By: Virgina Norfolk M.D.   On: 01/18/2020 18:21    Assessment: 19 year old female presenting with 10 day history of severe muscle aches, reporting working double shifts at [employer], and found to have elevated CK with rhabdomyolysis and elevated transaminases on admission. he is without abdominal pain, denies any OTC herbal supplements/meds, isolated Tylenol use, no ETOH, and only medication as outpatient prior to onset was doxycycline for presumed buttock furuncle.   Rhabdomyolysis: Etiology of rhabdomyolysis is not clear.  She has been receiving aggressive IV fluid resuscitation and CK is downtrending.  Management per hospitalist.  Elevated LFTs: Elevation on admission with AST 528, ALT 142, alk phos and T bili normal.  No baseline to compare to. No focal liver lesions, gallbladder abnormality, or biliary dilation on CT A/P with contrast. Viral hepatitis panel negative.  INR 1.0.  LFTs are downtrending with AST 357, ALT 101. Suspect transaminitis is likely secondary to rhabdomyolysis. Continue to monitor for now.   Vomiting: Couple episodes of brief nausea and  non-bloody emesis. No associated abdominal pain. Seems to be closely associated with administration of pain medications. Patient states her pain medications are supposed to be getting changed as they aren't helping. Nausea/vomiting could also be influenced by flagyl. CT 11/28 with normal gallbladder and biliary tree. No acute gastric or bowel findings. Will continue to monitor for now as she is no longer receiving dilaudid. Zofran as needed.   Mild Constipation: Last BM Friday. No significant history of constipation. Asking for something to help with BMs. Will start MiraLAX daily for now.   Pain/swelling in left arm: Patient states this has been present since admission. Left arm doesn't appear significantly different than right arm. No erythema. No tenderness to palpation. Will notify hospitalists.   Plan: Management of rhabdomyolysis per hospitalist. Continue to monitor LFTs. Hold off on additional serologic work-up for elevated LFTs for now as they are trending down.  Pending clinical course, may need to consider additional work-up. Start MiraLAX 17 g daily for mild constipation. Continue to monitor for ongoing nausea/vomiting. Continue Zofran as needed. Notify Dr. Dyann Kief about pain/swelling in left arm.     LOS: 1 day    01/20/2020, 11:22 AM   Aliene Altes, [facility]

## 2020-01-20 NOTE — Progress Notes (Signed)
PROGRESS NOTE    Haley Sandoval  MVE:[id number] DOB: 2000-08-26 DOA: 01/18/2020 PCP: Halford Chessman, MD   Chief Complaint  Patient presents with  . Generalized Body Aches    Brief Narrative:  As per H&P written by Dr. Josephine Cables on 01/18/2020 Haley Sandoval is a 19 y.o. female obese with medical history significant for seasonal allergies who presents to the emergency department due to 11-day onset of generalized body aches.  Patient states that she had a rash on her buttock and she went to see her PCP who prescribed her with mupirocin and an antibiotic (name unknown), last dose of antibiotic was on Friday 11/19, symptoms started on 11/18 while at work, initially with aches only in left hand and shoulder, but this has since progressed to generalized body aches.  She took Tylenol on Friday (11/26) morning (2 tablets of 650 mg) and repeated same dose in the evening of same day with no improvement, so she stopped taking the Tylenol.  She presented to an urgent care earlier today where she was noted to be febrile and diaphoretic, so she was transferred to the ED for further evaluation and management.  Patient denies any trauma, fall or any known musculoskeletal/immunological disorder in the family.  She denies nausea, vomiting, chest pain, shortness of breath, abdominal pain.  She denies use of any other over-the-counter medication other than Tylenol or use of any natural herbs.  ED Course:  In the emergency department, on arrival to the ED with a temperature of 100.74F, patient was tachycardic and tachypneic.  Work-up in the ED, showed normal CBC and BMP except for mild hyponatremia, hypoalbuminemia, elevated liver enzymes, CK 31,230, CRP 3.3, ESR 15, acetaminophen level < 10, salicylate level < 7.0, urinalysis was unimpressive for UTI.  Last menstrual period was unknown due to persistent use of OCPs.  She is sexually active, though she states that she has not had any recent intercourse. CT abdomen and  pelvis with contrast showed mild, nonspecific bilateral posterior abdominal and pelvic inflammatory fat stranding which may represent sequelae associated with mild mesenteritis and/or pelvic inflammatory disease. Chest x-ray showed no active disease Patient was empirically started on IV ceftriaxone, doxycycline and metronidazole, IV Toradol was given.  Hospitalist was asked to admit patient for further evaluation and management  Assessment & Plan: 1-nontraumatic rhabdomyolysis -CK level still high; but improved -Complaining of generalized myalgia and weakness. -Left forearm swollen and overall picture with concerns for myositis. -Will check aldolase level and provide empirical treatment with steroids. -Continue aggressive fluid resuscitation -Patient advised to maintain adequate oral hydration as well. -Continue to follow CK levels; continue PRN muscle relaxants.  2-Elevated liver enzymes -Most likely associated with rhabdomyolysis process -Hepatitis panel negative -Continue IV fluid and supportive care -Follow trend.  LFTs trending down appropriately. -Appreciate assistance and recommendation by GI service.  3-Acute febrile illness -With concern for pelvic inflammatory disease on admission. -Complete treatment with doxycycline. -Patient denies dysuria or discharges. -Continue to maintain adequate hydration -Will continue as needed antipyretics.  4-Hyponatremia -In the setting of dehydration -Improve and sodium level up to 134 currently. -Continue to follow sodium trend  5-Obesity, Class III, BMI 40-49.9 (morbid obesity) (HCC) -Body mass index is 44.93 kg/m. -Low calorie diet, portion control increase physical activity discussed with patient.   DVT prophylaxis: Lovenox Code Status: Full code. Family Communication: Mother at bedside Disposition:   Status is: Inpatient  Dispo: The patient is from: Home  Anticipated d/c is to: Home              Anticipated  d/c date is: 1-2 days.              Patient currently CK level is still elevated and LFTs even improvement is still high.  Complaining of generalized myalgias.  Continue aggressive fluid resuscitation, pain management.    Consultants:   Gastroenterology service  Procedures:  See below for x-ray report.  Antimicrobials:  Patient is receiving Doxycycline  Flagyl 11/28>>>11/30   Subjective: No fever; no chest pain, no nausea, no vomiting.  Patient expressed feeling weak and having generalized myalgias.  Having some constipation.  Objective: Vitals:   01/19/20 1856 01/19/20 2100 01/20/20 0455 01/20/20 0800  BP: 135/87 104/67 121/70 137/90  Pulse: 96 (!) 103 96 94  Resp: _0 Temp: 98.8 F (37.1 C) 99.3 F (37.4 C) 98.8 F (37.1 C) 98.9 F (37.2 C)  TempSrc: Oral Oral Oral Oral  SpO2: 100% 100% 100% 100%  Weight:      Height:        Intake/Output Summary (Last 24 hours) at 01/20/2020 1312 Last data filed at 01/20/2020 0900 Gross per 24 hour  Intake 2910.16 ml  Output --  Net 2910.16 ml   Filed Weights   01/18/20 1659  Weight: 122.5 kg    Examination: General exam: Alert, awake, oriented x 3; complaining of general myalgias and having left forearm swelling.  Patient also expressed constipation. Respiratory system: Clear to auscultation. Respiratory effort normal.  No using accessory muscles. Cardiovascular system:RRR. No murmurs, rubs, gallops. Gastrointestinal system: Abdomen is obese, nondistended, soft and nontender. No organomegaly or masses felt. Normal bowel sounds heard. Central nervous system: Alert and oriented. No focal neurological deficits. Extremities: No cyanosis or clubbing Skin: No rashes, no petechiae. Psychiatry: Judgement and insight appear normal. Mood & affect appropriate.    Data Reviewed: I have personally reviewed following labs and imaging studies  CBC: Recent Labs  Lab 01/18/20 1830 01/19/20 0508  WBC 8.1 6.8  NEUTROABS  5.7  --   HGB 14.8 13.7  HCT 45.3 41.7  MCV 88.1 87.6  PLT 300 102    Basic Metabolic Panel: Recent Labs  Lab 01/18/20 1830 01/19/20 0508 01/20/20 0504  NA 132* 132* 134*  K 4.1 4.0 4.1  CL 99 103 105  CO2 26 21* 22  GLUCOSE 104* 100* 90  BUN _1 CREATININE 0.65 0.58 0.62  CALCIUM 8.2* 7.9* 7.9*  MG  --  1.9  --   PHOS  --  3.5  --     GFR: Estimated Creatinine Clearance: 147.3 mL/min (by C-G formula based on SCr of 0.62 mg/dL).  Liver Function Tests: Recent Labs  Lab 01/18/20 1830 01/19/20 0508 01/20/20 0504  AST 528* 484* 357*  ALT 142* 135* 101*  ALKPHOS 50 31* 28*  BILITOT 0.1* 0.3 0.4  PROT 6.3* 5.7* 4.9*  ALBUMIN 3.1* 2.7* 2.4*    CBG: No results for input(s): GLUCAP in the last 168 hours.   Recent Results (from the past 240 hour(s))  Resp Panel by RT-PCR (Flu A&B, Covid) Nasopharyngeal Swab     Status: None   Collection Time: 01/18/20  5:57 PM   Specimen: Nasopharyngeal Swab; Nasopharyngeal(NP) swabs in vial transport medium  Result Value Ref Range Status   SARS Coronavirus 2 by RT PCR NEGATIVE NEGATIVE Final    Comment: (NOTE) SARS-CoV-2 target nucleic acids are NOT DETECTED.  The SARS-CoV-2 RNA is generally detectable in upper respiratory specimens during the acute phase of infection. The lowest concentration of SARS-CoV-2 viral copies this assay can detect is 138 copies/mL. A negative result does not preclude SARS-Cov-2 infection and should not be used as the sole basis for treatment or other patient management decisions. A negative result may occur with  improper specimen collection/handling, submission of specimen other than nasopharyngeal swab, presence of viral mutation(s) within the areas targeted by this assay, and inadequate number of viral copies(<138 copies/mL). A negative result must be combined with clinical observations, patient history, and epidemiological information. The expected result is Negative.  Fact Sheet for  Patients:  EntrepreneurPulse.com.au  Fact Sheet for Healthcare Providers:  IncredibleEmployment.be  This test is no t yet approved or cleared by the [place] FDA and  has been authorized for detection and/or diagnosis of SARS-CoV-2 by FDA under an Emergency Use Authorization (EUA). This EUA will remain  in effect (meaning this test can be used) for the duration of the COVID-19 declaration under Section 564(b)(1) of the Act, 21 U.S.C.section 360bbb-3(b)(1), unless the authorization is terminated  or revoked sooner.       Influenza A by PCR NEGATIVE NEGATIVE Final   Influenza B by PCR NEGATIVE NEGATIVE Final    Comment: (NOTE) The Xpert Xpress SARS-CoV-2/FLU/RSV plus assay is intended as an aid in the diagnosis of influenza from Nasopharyngeal swab specimens and should not be used as a sole basis for treatment. Nasal washings and aspirates are unacceptable for Xpert Xpress SARS-CoV-2/FLU/RSV testing.  Fact Sheet for Patients: EntrepreneurPulse.com.au  Fact Sheet for Healthcare Providers: IncredibleEmployment.be  This test is not yet approved or cleared by the [place] FDA and has been authorized for detection and/or diagnosis of SARS-CoV-2 by FDA under an Emergency Use Authorization (EUA). This EUA will remain in effect (meaning this test can be used) for the duration of the COVID-19 declaration under Section 564(b)(1) of the Act, 21 U.S.C. section 360bbb-3(b)(1), unless the authorization is terminated or revoked.  Performed at [facility], [address], [place], [place] [address]       Radiology Studies: CT ABDOMEN PELVIS W CONTRAST  Result Date: 01/18/2020 CLINICAL DATA:  Body aches. EXAM: CT ABDOMEN AND PELVIS WITH CONTRAST TECHNIQUE: Multidetector CT imaging of the abdomen and pelvis was performed using the standard protocol following bolus administration of intravenous contrast.  CONTRAST:  168m OMNIPAQUE IOHEXOL 300 MG/ML  SOLN COMPARISON:  None. FINDINGS: Lower chest: No acute abnormality. Hepatobiliary: No focal liver abnormality is seen. No gallstones, gallbladder wall thickening, or biliary dilatation. Pancreas: Unremarkable. No pancreatic ductal dilatation or surrounding inflammatory changes. Spleen: Normal in size without focal abnormality. Adrenals/Urinary Tract: Adrenal glands are unremarkable. Kidneys are normal, without renal calculi, focal lesion, or hydronephrosis. Bladder is unremarkable. Stomach/Bowel: Stomach is within normal limits. Appendix appears normal. No evidence of bowel wall thickening, distention, or inflammatory changes. Vascular/Lymphatic: No significant vascular findings are present. No enlarged abdominal or pelvic lymph nodes. Reproductive: Uterus is normal in appearance. Multiple ill-defined bilateral subcentimeter ovarian cysts are seen. Other: No abdominal wall hernia or abnormality. No abdominopelvic ascites. Mild, bilateral nonspecific mesenteric inflammatory fat stranding is seen along the posterior aspects of the mid to lower abdomen. A mild amount of diffuse pelvic inflammatory fat stranding is seen. Musculoskeletal: Mild subcutaneous inflammatory fat stranding is seen along the lateral aspect of the gluteal region on the right. No acute or significant osseous findings. IMPRESSION: 1. Mild, nonspecific bilateral posterior abdominal and pelvic inflammatory  fat stranding which may represent sequelae associated with mild mesenteritis and/or pelvic inflammatory disease. Electronically Signed   By: Virgina Norfolk M.D.   On: 01/18/2020 20:38   DG Chest Portable 1 View  Result Date: 01/18/2020 CLINICAL DATA:  Fever and body aches. EXAM: PORTABLE CHEST 1 VIEW COMPARISON:  Jul 11, 2013 FINDINGS: The heart size and mediastinal contours are within normal limits. Both lungs are clear. The visualized skeletal structures are unremarkable. IMPRESSION: No  active disease. Electronically Signed   By: Virgina Norfolk M.D.   On: 01/18/2020 18:21    Scheduled Meds: . doxycycline  100 mg Oral Q12H  . enoxaparin (LOVENOX) injection  60 mg Subcutaneous Q24H  . feeding supplement  237 mL Oral BID BM  . methylPREDNISolone (SOLU-MEDROL) injection  40 mg Intravenous Q12H  . polyethylene glycol  17 g Oral Daily   Continuous Infusions: . sodium chloride 150 mL/hr at 01/20/20 1228  . methocarbamol (ROBAXIN) IV Stopped (01/19/20 1324)     LOS: 1 day    Time spent: 30 minutes.    Barton Dubois, MD [facility]   To contact the attending provider between 7A-7P or the covering provider during after hours 7P-7A, please log into the web site [email or web address] and access using universal [place] password for that web site. If you do not have the password, please call the hospital operator.  01/20/2020, 1:12 PM

## 2020-01-21 ENCOUNTER — Telehealth: Payer: Self-pay | Admitting: Gastroenterology

## 2020-01-21 DIAGNOSIS — M6282 Rhabdomyolysis: Secondary | ICD-10-CM | POA: Diagnosis not present

## 2020-01-21 LAB — CK: Total CK: 12386 U/L — ABNORMAL HIGH (ref 38–234)

## 2020-01-21 LAB — COMPREHENSIVE METABOLIC PANEL
ALT: 115 U/L — ABNORMAL HIGH (ref 0–44)
AST: 374 U/L — ABNORMAL HIGH (ref 15–41)
Albumin: 2.5 g/dL — ABNORMAL LOW (ref 3.5–5.0)
Alkaline Phosphatase: 37 U/L — ABNORMAL LOW (ref 38–126)
Anion gap: 5 (ref 5–15)
BUN: 9 mg/dL (ref 6–20)
CO2: 23 mmol/L (ref 22–32)
Calcium: 7.9 mg/dL — ABNORMAL LOW (ref 8.9–10.3)
Chloride: 105 mmol/L (ref 98–111)
Creatinine, Ser: 0.5 mg/dL (ref 0.44–1.00)
GFR, Estimated: >60 mL/min (ref >60)
Glucose, Bld: 140 mg/dL — ABNORMAL HIGH (ref 70–99)
Potassium: 4 mmol/L (ref 3.5–5.1)
Sodium: 133 mmol/L — ABNORMAL LOW (ref 135–145)
Total Bilirubin: 0.4 mg/dL (ref 0.3–1.2)
Total Protein: 5.1 g/dL — ABNORMAL LOW (ref 6.5–8.1)

## 2020-01-21 MED ORDER — OXYCODONE HCL 5 MG PO TABS
5.0000 mg | ORAL_TABLET | ORAL | Status: DC | PRN
  Administered 2020-01-21 (×2): 5 mg via ORAL
  Filled 2020-01-21 (×2): qty 1

## 2020-01-21 MED ORDER — SODIUM CHLORIDE 0.9 % IV SOLN: INTRAVENOUS | Status: DC

## 2020-01-21 MED ORDER — HYDROMORPHONE HCL 1 MG/ML IJ SOLN: 1.0000 mg | INTRAMUSCULAR | Status: DC | PRN

## 2020-01-21 MED ORDER — FENTANYL CITRATE (PF) 100 MCG/2ML IJ SOLN
25.0000 ug | Freq: Once | INTRAMUSCULAR | Status: AC
  Administered 2020-01-21: 25 ug via INTRAVENOUS
  Filled 2020-01-21: qty 2

## 2020-01-21 MED ORDER — METHOCARBAMOL 1000 MG/10ML IJ SOLN
INTRAMUSCULAR | Status: AC
  Filled 2020-01-21: qty 10

## 2020-01-21 NOTE — Progress Notes (Signed)
PROGRESS NOTE    Haley Sandoval  FXT:[id number] DOB: [birth year] DOA: 01/18/2020 PCP: Halford Chessman, MD   Brief Narrative:  As per H&P written by Dr. Josephine Cables on 01/18/2020 Haley Sandoval a 19 y.o.femaleobesewith medical history significant forseasonal allergies who presents to the emergency department due to 11-day onset of generalized body aches. Patient states that she had a rash on her buttock and she went to see her PCP who prescribed her with mupirocin and an antibiotic (name unknown), last dose of antibiotic was on Friday 11/19, symptoms started on 11/18 while at work, initially with aches only in left hand and shoulder, but this has since progressed to generalized body aches. She took Tylenol on Friday (11/26) morning (2 tablets of 650 mg) and repeated same dose in the evening of same day with no improvement, so she stopped taking the Tylenol. She presented to an urgent care earlier today where she was noted to be febrileand diaphoretic, so she was transferred to the ED for further evaluation and management. Patient denies any trauma, fall or anyknownmusculoskeletal/immunological disorder in the family. She denies nausea, vomiting, chest pain, shortness of breath, abdominal pain. She denies use of any other over-the-counter medication other than Tylenol or use of any natural herbs.  ED Course: In the emergency department, on arrival to the ED with a temperature of 100.38F, patient was tachycardic and tachypneic. Work-up in the ED, showed normal CBC and BMP except for mild hyponatremia, hypoalbuminemia, elevated liver enzymes, CK 31,230, CRP 3.3, ESR 15, acetaminophen level < 10,salicylate level< 7.0, urinalysis was unimpressive for UTI. Last menstrual period was unknown due to persistent use of OCPs. She is sexually active, though she states that she has not had anyrecent intercourse. CT abdomen and pelvis with contrast showedmild, nonspecific bilateral posterior abdominal  and pelvic inflammatory fat stranding which may represent sequelae associated with mild mesenteritis and/or pelvic inflammatory disease. Chest x-ray showed no active disease Patient was empirically started on IV ceftriaxone, doxycycline and metronidazole, IV Toradol was given. Hospitalist was asked to admit patient for further evaluation and management   Assessment & Plan:   Principal Problem:   Rhabdomyolysis Active Problems:   Elevated liver enzymes   Acute febrile illness   Hypoalbuminemia   Hyponatremia   Obesity, Class III, BMI 40-49.9 (morbid obesity) (HCC)   Hepatitis   Non-intractable vomiting   Constipation  1-nontraumatic rhabdomyolysis -CK level still high; but improved -Complaining of generalized myalgia and weakness. -Left forearm swollen and overall picture with concerns for myositis. -Will check aldolase level and provide empirical treatment with steroids -Continue aggressive fluid resuscitation -Patient advised to maintain adequate oral hydration as well. -Continue to follow CK levels; continue PRN muscle relaxants. -Plan to have patient follow-up with outpatient rheumatology  2-Elevated liver enzymes -Most likely associated with rhabdomyolysis process -Hepatitis panel negative -Continue IV fluid and supportive care -Follow trend.  LFTs trending down appropriately. -Appreciate assistance and recommendation by GI service with recommendations to follow-up outpatient in 3-4 weeks.  3-Acute febrile illness-resolved -With concern for pelvic inflammatory disease on admission. -Complete treatment with doxycycline. -Patient denies dysuria or discharges. -Continue to maintain adequate hydration -Will continue as needed antipyretics.  4-Hyponatremia -In the setting of dehydration -Improve and sodium level up to 133 currently. -Continue to follow sodium trend  5-Obesity, Class III, BMI 40-49.9 (morbid obesity) (HCC) -Body mass index is 44.93 kg/m. -Low  calorie diet, portion control increase physical activity discussed with patient.    DVT prophylaxis: Lovenox Code Status: Full Family  Communication: Mother at bedside Disposition Plan:  Status is: Inpatient  Remains inpatient appropriate because:IV treatments appropriate due to intensity of illness or inability to take PO and Inpatient level of care appropriate due to severity of illness   Dispo: The patient is from: Home              Anticipated d/c is to: Home              Anticipated d/c date is: 1 day              Patient currently is not medically stable to d/c.  Patient needs further IV fluid and pain management.  Consultants:   GI s/o  Procedures:   See below  Antimicrobials:  Anti-infectives (From admission, onward)   Start     Dose/Rate Route Frequency Ordered Stop   01/20/20 2200  doxycycline (VIBRA-TABS) tablet 100 mg        100 mg Oral Every 12 hours 01/20/20 1259 01/25/20 2159   01/19/20 1000  doxycycline (VIBRA-TABS) tablet 100 mg  Status:  Discontinued        100 mg Oral Every 12 hours 01/19/20 0913 01/20/20 1259   01/19/20 0930  metroNIDAZOLE (FLAGYL) tablet 500 mg  Status:  Discontinued        500 mg Oral Every 8 hours 01/19/20 0913 01/20/20 1259   01/18/20 2200  cefTRIAXone (ROCEPHIN) 1 g in sodium chloride 0.9 % 100 mL IVPB        1 g 200 mL/hr over 30 Minutes Intravenous  Once 01/18/20 2150 01/19/20 0648   01/18/20 2200  doxycycline (VIBRAMYCIN) 100 mg in sodium chloride 0.9 % 250 mL IVPB  Status:  Discontinued        100 mg 125 mL/hr over 120 Minutes Intravenous Every 12 hours 01/18/20 2150 01/19/20 0913   01/18/20 2200  metroNIDAZOLE (FLAGYL) IVPB 500 mg  Status:  Discontinued        500 mg 100 mL/hr over 60 Minutes Intravenous  Once 01/18/20 2150 01/19/20 0913       Subjective: Patient seen and evaluated today with ongoing complaints of pain in her arms and legs.  She has difficulty with ambulation and continues to require IV pain medications to  help her tolerate her pain.  Objective: Vitals:   01/20/20 1448 01/20/20 2045 01/21/20 0445 01/21/20 1452  BP: 122/77 (!) 151/91 132/83 133/87  Pulse: 92 96 94 95  Resp: _0 Temp: 99.2 F (37.3 C) 98.9 F (37.2 C) 98.1 F (36.7 C) 98.4 F (36.9 C)  TempSrc: Oral Oral Oral   SpO2: 100% 100% 100% 100%  Weight:      Height:        Intake/Output Summary (Last 24 hours) at 01/21/2020 1502 Last data filed at 01/21/2020 0300 Gross per 24 hour  Intake 2400.73 ml  Output --  Net 2400.73 ml   Filed Weights   01/18/20 1659  Weight: 122.5 kg    Examination:  General exam: Appears calm and comfortable, obese Respiratory system: Clear to auscultation. Respiratory effort normal. Cardiovascular system: S1 & S2 heard, RRR.  Gastrointestinal system: Abdomen is nondistended, soft and nontender.  Central nervous system: Alert and oriented. No focal neurological deficits. Extremities: Bilateral forearm swelling noted.  Mild tenderness to palpation Skin: No rashes, lesions or ulcers Psychiatry: Judgement and insight appear normal. Mood & affect appropriate.     Data Reviewed: I have personally reviewed following labs and imaging studies  CBC: Recent  Labs  Lab 01/18/20 1830 01/19/20 0508  WBC 8.1 6.8  NEUTROABS 5.7  --   HGB 14.8 13.7  HCT 45.3 41.7  MCV 88.1 87.6  PLT 300 559   Basic Metabolic Panel: Recent Labs  Lab 01/18/20 1830 01/19/20 0508 01/20/20 0504 01/21/20 0606  NA 132* 132* 134* 133*  K 4.1 4.0 4.1 4.0  CL 99 103 105 105  CO2 26 21* 22 23  GLUCOSE 104* 100* 90 140*  BUN _0 CREATININE 0.65 0.58 0.62 0.50  CALCIUM 8.2* 7.9* 7.9* 7.9*  MG  --  1.9  --   --   PHOS  --  3.5  --   --    GFR: Estimated Creatinine Clearance: 147.3 mL/min (by C-G formula based on SCr of 0.5 mg/dL). Liver Function Tests: Recent Labs  Lab 01/18/20 1830 01/19/20 0508 01/20/20 0504 01/21/20 0606  AST 528* 484* 357* 374*  ALT 142* 135* 101* 115*  ALKPHOS  50 31* 28* 37*  BILITOT 0.1* 0.3 0.4 0.4  PROT 6.3* 5.7* 4.9* 5.1*  ALBUMIN 3.1* 2.7* 2.4* 2.5*   No results for input(s): LIPASE, AMYLASE in the last 168 hours. No results for input(s): AMMONIA in the last 168 hours. Coagulation Profile: Recent Labs  Lab 01/19/20 0508  INR 1.0   Cardiac Enzymes: Recent Labs  Lab 01/18/20 1830 01/19/20 0508 01/20/20 0504 01/21/20 0606  CKTOTAL 31,230* 24,057* 15,950* 12,386*   BNP (last 3 results) No results for input(s): PROBNP in the last 8760 hours. HbA1C: No results for input(s): HGBA1C in the last 72 hours. CBG: No results for input(s): GLUCAP in the last 168 hours. Lipid Profile: No results for input(s): CHOL, HDL, LDLCALC, TRIG, CHOLHDL, LDLDIRECT in the last 72 hours. Thyroid Function Tests: Recent Labs    01/18/20 1830  TSH 2.131   Anemia Panel: No results for input(s): VITAMINB12, FOLATE, FERRITIN, TIBC, IRON, RETICCTPCT in the last 72 hours. Sepsis Labs: Recent Labs  Lab 01/18/20 2350 01/19/20 0508 01/20/20 0504  PROCALCITON <0.10 <0.10 <0.10    Recent Results (from the past 240 hour(s))  Resp Panel by RT-PCR (Flu A&B, Covid) Nasopharyngeal Swab     Status: None   Collection Time: 01/18/20  5:57 PM   Specimen: Nasopharyngeal Swab; Nasopharyngeal(NP) swabs in vial transport medium  Result Value Ref Range Status   SARS Coronavirus 2 by RT PCR NEGATIVE NEGATIVE Final    Comment: (NOTE) SARS-CoV-2 target nucleic acids are NOT DETECTED.  The SARS-CoV-2 RNA is generally detectable in upper respiratory specimens during the acute phase of infection. The lowest concentration of SARS-CoV-2 viral copies this assay can detect is 138 copies/mL. A negative result does not preclude SARS-Cov-2 infection and should not be used as the sole basis for treatment or other patient management decisions. A negative result may occur with  improper specimen collection/handling, submission of specimen other than nasopharyngeal swab,  presence of viral mutation(s) within the areas targeted by this assay, and inadequate number of viral copies(<138 copies/mL). A negative result must be combined with clinical observations, patient history, and epidemiological information. The expected result is Negative.  Fact Sheet for Patients:  EntrepreneurPulse.com.au  Fact Sheet for Healthcare Providers:  IncredibleEmployment.be  This test is no t yet approved or cleared by the [place] FDA and  has been authorized for detection and/or diagnosis of SARS-CoV-2 by FDA under an Emergency Use Authorization (EUA). This EUA will remain  in effect (meaning this test can be used) for the duration  of the COVID-19 declaration under Section 564(b)(1) of the Act, 21 U.S.C.section 360bbb-3(b)(1), unless the authorization is terminated  or revoked sooner.       Influenza A by PCR NEGATIVE NEGATIVE Final   Influenza B by PCR NEGATIVE NEGATIVE Final    Comment: (NOTE) The Xpert Xpress SARS-CoV-2/FLU/RSV plus assay is intended as an aid in the diagnosis of influenza from Nasopharyngeal swab specimens and should not be used as a sole basis for treatment. Nasal washings and aspirates are unacceptable for Xpert Xpress SARS-CoV-2/FLU/RSV testing.  Fact Sheet for Patients: EntrepreneurPulse.com.au  Fact Sheet for Healthcare Providers: IncredibleEmployment.be  This test is not yet approved or cleared by the [place] FDA and has been authorized for detection and/or diagnosis of SARS-CoV-2 by FDA under an Emergency Use Authorization (EUA). This EUA will remain in effect (meaning this test can be used) for the duration of the COVID-19 declaration under Section 564(b)(1) of the Act, 21 U.S.C. section 360bbb-3(b)(1), unless the authorization is terminated or revoked.  Performed at [facility], [address], [place], [place] [address]          Radiology  Studies: No results found.      Scheduled Meds: . doxycycline  100 mg Oral Q12H  . enoxaparin (LOVENOX) injection  60 mg Subcutaneous Q24H  . feeding supplement  237 mL Oral BID BM  . methylPREDNISolone (SOLU-MEDROL) injection  40 mg Intravenous Q12H  . polyethylene glycol  17 g Oral Daily   Continuous Infusions: . sodium chloride    . methocarbamol (ROBAXIN) IV Stopped (01/19/20 1324)     LOS: 2 days    Time spent: 30 minutes    Geneve Kimpel Darleen Crocker, DO [facility]  If 7PM-7AM, please contact night-coverage [email or web address] 01/21/2020, 3:02 PM

## 2020-01-21 NOTE — Evaluation (Signed)
Physical Therapy Evaluation Patient Details Name: Haley Sandoval MRN: [id number] DOB: [birth year] Today's Date: 01/21/2020   History of Present Illness  Haley Sandoval is a 19 y.o. female obese with medical history significant for seasonal allergies who presents to the emergency department due to 11-day onset of generalized body aches.  Patient states that she had a rash on her buttock and she went to see her PCP who prescribed her with mupirocin and an antibiotic (name unknown), last dose of antibiotic was on Friday 11/19, symptoms started on 11/18 while at work, initially with aches only in left hand and shoulder, but this has since progressed to generalized body aches.  She took Tylenol on Friday (11/26) morning (2 tablets of 650 mg) and repeated same dose in the evening of same day with no improvement, so she stopped taking the Tylenol.  She presented to an urgent care earlier today where she was noted to be febrile and diaphoretic, so she was transferred to the ED for further evaluation and management.  Patient denies any trauma, fall or any known musculoskeletal/immunological disorder in the family.  She denies nausea, vomiting, chest pain, shortness of breath, abdominal pain.  She denies use of any other over-the-counter medication other than Tylenol or use of any natural herbs.    Clinical Impression  Patient requires assist to pull self to sitting with head of bed flat due weakness and BUE pain, ambulated in hallway without loss of balance using RW, but required frequent standing rest breaks due to tightness in thighs, and able to take a few steps without using RW without loss of balance.  Patient encouraged to ambulate daily as much as tolerated without using RW secondary to stating it hurts her arms when leaning on walker.  Patient will benefit from continued physical therapy in hospital and recommended venue below to increase strength, balance, endurance for safe ADLs and gait.    Follow Up  Recommendations Home health PT;Supervision - Intermittent    Equipment Recommendations  3in1 (PT);Rolling walker with 5" wheels    Recommendations for Other Services       Precautions / Restrictions Precautions Precautions: None Restrictions Weight Bearing Restrictions: No      Mobility  Bed Mobility Overal bed mobility: Needs Assistance Bed Mobility: Supine to Sit     Supine to sit: Min assist     General bed mobility comments: required Min assist with bed flat    Transfers Overall transfer level: Needs assistance Equipment used: Rolling walker (2 wheeled);None Transfers: Sit to/from [employer] Sit to Stand: Supervision Stand pivot transfers: Supervision       General transfer comment: increased time, labored movement  Ambulation/Gait Ambulation/Gait assistance: Supervision Gait Distance (Feet): 60 Feet Assistive device: Rolling walker (2 wheeled) Gait Pattern/deviations: Decreased step length - right;Decreased step length - left;Decreased stride length Gait velocity: decreased   General Gait Details: slow labored cadence requiring frequent standing rest breaks, no loss of balance  Stairs            Wheelchair Mobility    Modified Rankin (Stroke Patients Only)       Balance Overall balance assessment: Needs assistance Sitting-balance support: Feet supported;No upper extremity supported Sitting balance-Leahy Scale: Good Sitting balance - Comments: seated at EOB   Standing balance support: During functional activity;No upper extremity supported Standing balance-Leahy Scale: Fair Standing balance comment: fair/good using RW  Pertinent Vitals/Pain Pain Assessment: Faces Faces Pain Scale: Hurts even more Pain Location: BUE, low back and thighs Pain Descriptors / Indicators: Aching;Sore Pain Intervention(s): Limited activity within patient's tolerance;Monitored during session;Repositioned     Home Living Family/patient expects to be discharged to:: Private residence Living Arrangements: Parent Available Help at Discharge: Family;Available PRN/intermittently Type of Home: House Home Access: Stairs to enter Entrance Stairs-Rails: Right Entrance Stairs-Number of Steps: 3 Home Layout: One level Home Equipment: None      Prior Function Level of Independence: Independent         Comments: Tourist information centre manager, drives, works     Higher education careers adviser        Extremity/Trunk Assessment   Upper Extremity Assessment Upper Extremity Assessment: Generalized weakness    Lower Extremity Assessment Lower Extremity Assessment: Generalized weakness    Cervical / Trunk Assessment Cervical / Trunk Assessment: Normal  Communication   Communication: No difficulties  Cognition Arousal/Alertness: Awake/alert Behavior During Therapy: WFL for tasks assessed/performed Overall Cognitive Status: Within Functional Limits for tasks assessed                                        General Comments      Exercises     Assessment/Plan    PT Assessment Patient needs continued PT services  PT Problem List Decreased strength;Decreased activity tolerance;Decreased balance;Decreased mobility       PT Treatment Interventions Balance training;DME instruction;Gait training;Stair training;Functional mobility training;Therapeutic activities;Patient/family education;Therapeutic exercise    PT Goals (Current goals can be found in the Care Plan section)  Acute Rehab PT Goals Patient Stated Goal: return home with family to assist PT Goal Formulation: With patient Time For Goal Achievement: 01/24/20 Potential to Achieve Goals: Good    Frequency Min 3X/week   Barriers to discharge        Co-evaluation               AM-PAC PT "6 Clicks" Mobility  Outcome Measure Help needed turning from your back to your side while in a flat bed without using bedrails?:  None Help needed moving from lying on your back to sitting on the side of a flat bed without using bedrails?: A Little Help needed moving to and from a bed to a chair (including a wheelchair)?: A Little Help needed standing up from a chair using your arms (e.g., wheelchair or bedside chair)?: None Help needed to walk in hospital room?: A Little Help needed climbing 3-5 steps with a railing? : A Little 6 Click Score: 20    End of Session   Activity Tolerance: Patient tolerated treatment well;Patient limited by fatigue;Patient limited by pain Patient left: in bed;with call bell/phone within reach;with family/visitor present Nurse Communication: Mobility status PT Visit Diagnosis: Unsteadiness on feet (R26.81);Other abnormalities of gait and mobility (R26.89);Muscle weakness (generalized) (M62.81)    Time: 5643-3295 PT Time Calculation (min) (ACUTE ONLY): 28 min   Charges:   PT Evaluation $PT Eval Moderate Complexity: 1 Mod PT Treatments $Therapeutic Activity: 23-37 mins        3:57 PM, 01/21/20 [name], MPT Physical Therapist with [facility] [phone] office [phone] mobile phone

## 2020-01-21 NOTE — Telephone Encounter (Signed)
Patient needs hospital follow-up in 3-4 weeks. She is still inpatient, but we have signed off. Dx: Elevated LFTs.

## 2020-01-21 NOTE — Telephone Encounter (Signed)
Noted  

## 2020-01-21 NOTE — Plan of Care (Signed)
  Problem: Acute Rehab PT Goals(only PT should resolve) Goal: Pt Will Go Supine/Side To Sit Outcome: Progressing Flowsheets (Taken 01/21/2020 1559) Pt will go Supine/Side to Sit: with supervision Goal: Patient Will Transfer Sit To/From Stand Outcome: Progressing Flowsheets (Taken 01/21/2020 1559) Patient will transfer sit to/from stand: with modified independence Goal: Pt Will Transfer Bed To Chair/Chair To Bed Outcome: Progressing Flowsheets (Taken 01/21/2020 1559) Pt will Transfer Bed to Chair/Chair to Bed: with modified independence Goal: Pt Will Ambulate Outcome: Progressing Flowsheets (Taken 01/21/2020 1559) Pt will Ambulate:  100 feet  with supervision  with modified independence  with least restrictive assistive device   3:59 PM, 01/21/20 [name], MPT Physical Therapist with [facility] [phone] office [phone] mobile phone

## 2020-01-21 NOTE — Telephone Encounter (Signed)
Patient was scheduled and I called [place] so her nurse will tell her before discharge

## 2020-01-22 DIAGNOSIS — R1110 Vomiting, unspecified: Secondary | ICD-10-CM | POA: Diagnosis not present

## 2020-01-22 DIAGNOSIS — K759 Inflammatory liver disease, unspecified: Secondary | ICD-10-CM | POA: Diagnosis not present

## 2020-01-22 DIAGNOSIS — M6282 Rhabdomyolysis: Secondary | ICD-10-CM | POA: Diagnosis not present

## 2020-01-22 LAB — COMPREHENSIVE METABOLIC PANEL
ALT: 152 U/L — ABNORMAL HIGH (ref 0–44)
AST: 399 U/L — ABNORMAL HIGH (ref 15–41)
Albumin: 2.5 g/dL — ABNORMAL LOW (ref 3.5–5.0)
Alkaline Phosphatase: 35 U/L — ABNORMAL LOW (ref 38–126)
Anion gap: 6 (ref 5–15)
BUN: 9 mg/dL (ref 6–20)
CO2: 22 mmol/L (ref 22–32)
Calcium: 8.2 mg/dL — ABNORMAL LOW (ref 8.9–10.3)
Chloride: 108 mmol/L (ref 98–111)
Creatinine, Ser: 0.47 mg/dL (ref 0.44–1.00)
GFR, Estimated: >60 mL/min (ref >60)
Glucose, Bld: 118 mg/dL — ABNORMAL HIGH (ref 70–99)
Potassium: 4 mmol/L (ref 3.5–5.1)
Sodium: 136 mmol/L (ref 135–145)
Total Bilirubin: 0.4 mg/dL (ref 0.3–1.2)
Total Protein: 5.3 g/dL — ABNORMAL LOW (ref 6.5–8.1)

## 2020-01-22 LAB — CK: Total CK: 12199 U/L — ABNORMAL HIGH (ref 38–234)

## 2020-01-22 MED ORDER — OXYCODONE HCL 5 MG PO TABS: 5.0000 mg | ORAL_TABLET | ORAL | 0 refills | Status: DC | PRN

## 2020-01-22 MED ORDER — PREDNISONE 10 MG PO TABS: ORAL_TABLET | ORAL | 0 refills | Status: DC

## 2020-01-22 MED ORDER — DOXYCYCLINE HYCLATE 100 MG PO TABS: 100.0000 mg | ORAL_TABLET | Freq: Two times a day (BID) | ORAL | 0 refills | Status: AC

## 2020-01-22 MED ORDER — METHOCARBAMOL 500 MG PO TABS: 500.0000 mg | ORAL_TABLET | Freq: Four times a day (QID) | ORAL | 0 refills | Status: DC | PRN

## 2020-01-22 NOTE — Plan of Care (Signed)
  Problem: Education: Goal: Knowledge of General Education information will improve Description: Including pain rating scale, medication(s)/side effects and non-pharmacologic comfort measures 01/22/2020 1129 by Cameron Ali, RN Outcome: Completed/Met 01/22/2020 1031 by Cameron Ali, RN Outcome: Progressing   Problem: Health Behavior/Discharge Planning: Goal: Ability to manage health-related needs will improve 01/22/2020 1129 by Cameron Ali, RN Outcome: Completed/Met 01/22/2020 1031 by Cameron Ali, RN Outcome: Progressing   Problem: Clinical Measurements: Goal: Ability to maintain clinical measurements within normal limits will improve 01/22/2020 1129 by Cameron Ali, RN Outcome: Completed/Met 01/22/2020 1031 by Cameron Ali, RN Outcome: Progressing Goal: Will remain free from infection 01/22/2020 1129 by Cameron Ali, RN Outcome: Completed/Met 01/22/2020 1031 by Cameron Ali, RN Outcome: Progressing Goal: Diagnostic test results will improve 01/22/2020 1129 by Cameron Ali, RN Outcome: Completed/Met 01/22/2020 1031 by Cameron Ali, RN Outcome: Progressing Goal: Respiratory complications will improve 01/22/2020 1129 by Cameron Ali, RN Outcome: Completed/Met 01/22/2020 1031 by Cameron Ali, RN Outcome: Progressing Goal: Cardiovascular complication will be avoided 01/22/2020 1129 by Cameron Ali, RN Outcome: Completed/Met 01/22/2020 1031 by Cameron Ali, RN Outcome: Progressing   Problem: Activity: Goal: Risk for activity intolerance will decrease 01/22/2020 1129 by Cameron Ali, RN Outcome: Completed/Met 01/22/2020 1031 by Cameron Ali, RN Outcome: Progressing   Problem: Nutrition: Goal: Adequate nutrition will be maintained 01/22/2020 1129 by Cameron Ali, RN Outcome: Completed/Met 01/22/2020 1031 by Cameron Ali, RN Outcome: Progressing   Problem: Coping: Goal: Level of anxiety will  decrease 01/22/2020 1129 by Cameron Ali, RN Outcome: Completed/Met 01/22/2020 1031 by Cameron Ali, RN Outcome: Progressing   Problem: Elimination: Goal: Will not experience complications related to bowel motility 01/22/2020 1129 by Cameron Ali, RN Outcome: Completed/Met 01/22/2020 1031 by Cameron Ali, RN Outcome: Progressing Goal: Will not experience complications related to urinary retention 01/22/2020 1129 by Cameron Ali, RN Outcome: Completed/Met 01/22/2020 1031 by Cameron Ali, RN Outcome: Progressing   Problem: Pain Managment: Goal: General experience of comfort will improve 01/22/2020 1129 by Cameron Ali, RN Outcome: Completed/Met 01/22/2020 1031 by Cameron Ali, RN Outcome: Progressing   Problem: Safety: Goal: Ability to remain free from injury will improve 01/22/2020 1129 by Cameron Ali, RN Outcome: Completed/Met 01/22/2020 1031 by Cameron Ali, RN Outcome: Progressing   Problem: Skin Integrity: Goal: Risk for impaired skin integrity will decrease 01/22/2020 1129 by Cameron Ali, RN Outcome: Completed/Met 01/22/2020 1031 by Cameron Ali, RN Outcome: Progressing

## 2020-01-22 NOTE — Progress Notes (Signed)
Discharge instructions given to patient, education completed, patient will call PCP to make appointment. All belongings sent with patient. No questions at this time. Await on sister to transport pt home.

## 2020-01-22 NOTE — Plan of Care (Signed)

## 2020-01-22 NOTE — TOC Transition Note (Signed)
Transition of Care [facility]) - CM/SW Discharge Note   Patient Details  Name: Haley Sandoval MRN: [id number] Date of Birth: 2000-10-11  Transition of Care [facility]) CM/SW Contact:  Leitha Bleak, RN Phone Number: 01/22/2020, 11:16 AM   Clinical Narrative:   Patient discharging home. Patients needs PCP appointment, Patient to follow up with April Gay MD.  MD ordered 3N1 and walker, Per patient she does not want the walker. Thereasa Distance with [employer] is here and will deliver 3N1 to the room.  PT is recommending HHPT. Patient has medicaid, TOC could not find and agencies to accept. Patient drives, discussed out patient PT with patient, she is accepting, orders placed and added to AVS.     Final next level of care: Home/Self Care Barriers to Discharge: Barriers Resolved   Patient Goals and CMS Choice Patient states their goals for this hospitalization and ongoing recovery are:: to go home. CMS Medicare.gov Compare Post Acute Care list provided to:: Patient Choice offered to / list presented to : Patient  Discharge Placement                Patient to be transferred to facility by: Sister will transport her home. Name of family member notified: patient Patient and family notified of of transfer: 01/22/20  Discharge Plan and Services                DME Arranged: 3-N-1 DME Agency: [employer] Date DME Agency Contacted: 01/22/20 Time DME Agency Contacted: 1100 Representative spoke with at DME Agency: Thereasa Distance

## 2020-01-22 NOTE — Discharge Summary (Signed)
Physician Discharge Summary  Haley Sandoval LXB:[id number] DOB: 11-Nov-2000 DOA: 01/18/2020  PCP: Halford Chessman, MD  Admit date: 01/18/2020  Discharge date: 01/22/2020  Admitted From:Home  Disposition:  Home  Recommendations for Outpatient Follow-up:  1. Follow up with PCP in 1-2 weeks; will be set up with TOC 2. Patient will need outpatient rheumatology follow-up to evaluate for myositis 3. Continue prolonged steroid taper for myositis as prescribed 4. Finish up 2 more days of doxycycline as prescribed to complete treatment for PID 5. Patient given oxycodone and Robaxin as needed for pain control 6. Follow-up with GI as scheduled to reevaluate LFTs in January 2022  Home Health: Yes with PT  Equipment/Devices: 3 and 1 and rolling walker  Discharge Condition: Stable  CODE STATUS: Full  Diet recommendation: Regular  Brief/Interim Summary: As per H&P written by Dr. Josephine Cables on 01/18/2020 Haley Sandoval a 19 y.o.femaleobesewith medical history significant forseasonal allergies who presents to the emergency department due to 11-day onset of generalized body aches. Patient states that she had a rash on her buttock and she went to see her PCP who prescribed her with mupirocin and an antibiotic (name unknown), last dose of antibiotic was on Friday 11/19, symptoms started on 11/18 while at work, initially with aches only in left hand and shoulder, but this has since progressed to generalized body aches. She took Tylenol on Friday (11/26) morning (2 tablets of 650 mg) and repeated same dose in the evening of same day with no improvement, so she stopped taking the Tylenol. She presented to an urgent care earlier today where she was noted to be febrileand diaphoretic, so she was transferred to the ED for further evaluation and management. Patient denies any trauma, fall or anyknownmusculoskeletal/immunological disorder in the family. She denies nausea, vomiting, chest pain, shortness  of breath, abdominal pain. She denies use of any other over-the-counter medication other than Tylenol or use of any natural herbs.  ED Course: In the emergency department, on arrival to the ED with a temperature of 100.62F, patient was tachycardic and tachypneic. Work-up in the ED, showed normal CBC and BMP except for mild hyponatremia, hypoalbuminemia, elevated liver enzymes, CK 31,230, CRP 3.3, ESR 15, acetaminophen level <10,salicylate CBULA<4.5, urinalysis was unimpressive for UTI. Last menstrual period was unknown due to persistent use of OCPs. She is sexually active, though she states that she has not had anyrecent intercourse. CT abdomen and pelvis with contrast showedmild, nonspecific bilateral posterior abdominal and pelvic inflammatory fat stranding which may represent sequelae associated with mild mesenteritis and/or pelvic inflammatory disease. Chest x-ray showed no active disease Patient was empirically started on IV ceftriaxone, doxycycline and metronidazole, IV Toradol was given. Hospitalist was asked to admit patient for further evaluation and management  -Patient was admitted with nontraumatic rhabdomyolysis with generalized myalgia and weakness that appears to be related to concerns of myositis.  She has demonstrated some improvement with use of IV steroids as well as IV fluids and CK level still remain around 12,000.  Her renal function is stable.  LFTs remain stable as well and she has been seen by GI with plans to follow-up in the outpatient setting to reevaluate LFTs.  She continues to have considerable amounts of pain, but is otherwise stable for discharge and will remain on prednisone taper as well as narcotic pain medications and muscle relaxers as prescribed.  She was also noted to have some findings of PID noted on CT abdomen pelvis for which she will finish up her course  of doxycycline as otherwise prescribed.  No other acute events or concerns noted throughout the  course of this admission.  PT has seen the patient with recommendations for home health PT and equipment as noted above.  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  Discharge Instructions  Discharge Instructions    Diet - low sodium heart healthy   Complete by: As directed    Increase activity slowly   Complete by: As directed      Allergies as of 01/22/2020   No Known Allergies     Medication List    TAKE these medications   doxycycline 100 MG tablet Commonly known as: VIBRA-TABS Take 1 tablet (100 mg total) by mouth every 12 (twelve) hours for 2 days.   ibuprofen 200 MG tablet Commonly known as: ADVIL Take 200 mg by mouth every 6 (six) hours as needed.   methocarbamol 500 MG tablet Commonly known as: Robaxin Take 1 tablet (500 mg total) by mouth every 6 (six) hours as needed for muscle spasms.   oxyCODONE 5 MG immediate release tablet Commonly known as: Oxy IR/ROXICODONE Take 1 tablet (5 mg total) by mouth every 4 (four) hours as needed for moderate pain or severe pain.   predniSONE 10 MG tablet Commonly known as: DELTASONE Take 6 tablets (60 mg total) by mouth daily for 7 days, THEN 5 tablets (50 mg total) daily for 7 days, THEN 4 tablets (40 mg total) daily for 7 days, THEN 3.5 tablets (35 mg total) daily for 7 days, THEN 3 tablets (30 mg total) daily for 7 days, THEN 2.5 tablets (25 mg total) daily for 7 days, THEN 2 tablets (20 mg total) daily for 7 days, THEN 1.5 tablets (15 mg total) daily for 7 days, THEN 1 tablet (10 mg total) daily for 7 days, THEN 0.5 tablets (5 mg total) daily for 7 days. Start taking on:  January 22, 2020            Durable Medical Equipment  (From admission, onward)         Start     Ordered   01/22/20 1056  DME 3-in-1  Once        01/22/20 1056   01/22/20 1056  DME Walker  Once       Question Answer Comment  Walker: With 5 Inch Wheels   Patient needs a walker to treat with the following condition Weakness      01/22/20 1056          Follow-up Information    [facility] On 03/05/2020.   Why: at 8:00 am Contact information: [address]       Halford Chessman, MD Follow up in 1 week(s).   Specialty: Pediatrics Contact information: [address] [place] [phone]              No Known Allergies  Consultations:  GI   Procedures/Studies: CT ABDOMEN PELVIS W CONTRAST  Result Date: 01/18/2020 CLINICAL DATA:  Body aches. EXAM: CT ABDOMEN AND PELVIS WITH CONTRAST TECHNIQUE: Multidetector CT imaging of the abdomen and pelvis was performed using the  standard protocol following bolus administration of intravenous contrast. CONTRAST:  160m OMNIPAQUE IOHEXOL 300 MG/ML  SOLN COMPARISON:  None. FINDINGS: Lower chest: No acute abnormality. Hepatobiliary: No focal liver abnormality is seen. No gallstones, gallbladder wall thickening, or biliary dilatation. Pancreas: Unremarkable. No pancreatic ductal dilatation or surrounding inflammatory changes. Spleen: Normal in size without focal abnormality. Adrenals/Urinary Tract: Adrenal glands are unremarkable. Kidneys are normal, without renal calculi, focal lesion, or hydronephrosis. Bladder is unremarkable. Stomach/Bowel: Stomach is within normal limits. Appendix appears normal. No evidence of bowel wall thickening, distention, or inflammatory changes. Vascular/Lymphatic: No significant vascular findings are present. No enlarged abdominal or pelvic lymph nodes. Reproductive: Uterus is normal in appearance. Multiple  ill-defined bilateral subcentimeter ovarian cysts are seen. Other: No abdominal wall hernia or abnormality. No abdominopelvic ascites. Mild, bilateral nonspecific mesenteric inflammatory fat stranding is seen along the posterior aspects of the mid to lower abdomen. A mild amount of diffuse pelvic inflammatory fat stranding is seen. Musculoskeletal: Mild subcutaneous inflammatory fat stranding is seen along the lateral aspect of the gluteal region on the right. No acute or significant osseous findings. IMPRESSION: 1. Mild, nonspecific bilateral posterior abdominal and pelvic inflammatory fat stranding which may represent sequelae associated with mild mesenteritis and/or pelvic inflammatory disease. Electronically Signed   By: TVirgina NorfolkM.D.   On: 01/18/2020 20:38   DG Chest Portable 1 View  Result Date: 01/18/2020 CLINICAL DATA:  Fever and body aches. EXAM: PORTABLE CHEST 1 VIEW COMPARISON:  Jul 11, 2013 FINDINGS: The heart size and mediastinal contours are within normal limits. Both lungs are clear. The visualized skeletal structures are unremarkable. IMPRESSION: No active disease. Electronically Signed   By: TVirgina NorfolkM.D.   On: 01/18/2020 18:21     Discharge Exam: Vitals:   01/21/20 2053 01/22/20 0404  BP: (!) 149/97 122/74  Pulse: (!) 104 83  Resp: 20 17  Temp: 98.3 F (36.8 C) (!) 97 F (36.1 C)  SpO2: 100% 100%   Vitals:   01/21/20 0445 01/21/20 1452 01/21/20 2053 01/22/20 0404  BP: 132/83 133/87 (!) 149/97 122/74  Pulse: 94 95 (!) 104 83  Resp: '18 18 20 17  ' Temp: 98.1 F (36.7 C) 98.4 F (36.9 C) 98.3 F (36.8 C) (!) 97 F (36.1 C)  TempSrc: Oral     SpO2: 100% 100% 100% 100%  Weight:      Height:        General: Pt is alert, awake, not in acute distress, obese Cardiovascular: RRR, S1/S2 +, no rubs, no gallops Respiratory: CTA bilaterally, no wheezing, no rhonchi Abdominal: Soft, NT, ND, bowel sounds + Extremities: no edema, no cyanosis    The results  of significant diagnostics from this hospitalization (including imaging, microbiology, ancillary and laboratory) are listed below for reference.     Microbiology: Recent Results (from the past 240 hour(s))  Resp Panel by RT-PCR (Flu A&B, Covid) Nasopharyngeal Swab     Status: None   Collection Time: 01/18/20  5:57 PM   Specimen: Nasopharyngeal Swab; Nasopharyngeal(NP) swabs in vial transport medium  Result Value Ref Range Status   SARS Coronavirus 2 by RT PCR NEGATIVE NEGATIVE Final    Comment: (NOTE) SARS-CoV-2 target nucleic acids are NOT DETECTED.  The SARS-CoV-2 RNA is generally detectable in upper respiratory specimens during the acute phase of infection. The lowest concentration of SARS-CoV-2 viral copies this assay can detect is 138 copies/mL. A negative result does not preclude SARS-Cov-2 infection and should not be used as the sole basis  for treatment or other patient management decisions. A negative result may occur with  improper specimen collection/handling, submission of specimen other than nasopharyngeal swab, presence of viral mutation(s) within the areas targeted by this assay, and inadequate number of viral copies(<138 copies/mL). A negative result must be combined with clinical observations, patient history, and epidemiological information. The expected result is Negative.  Fact Sheet for Patients:  EntrepreneurPulse.com.au  Fact Sheet for Healthcare Providers:  IncredibleEmployment.be  This test is no t yet approved or cleared by the [place] FDA and  has been authorized for detection and/or diagnosis of SARS-CoV-2 by FDA under an Emergency Use Authorization (EUA). This EUA will remain  in effect (meaning this test can be used) for the duration of the COVID-19 declaration under Section 564(b)(1) of the Act, 21 U.S.C.section 360bbb-3(b)(1), unless the authorization is terminated  or revoked sooner.       Influenza A  by PCR NEGATIVE NEGATIVE Final   Influenza B by PCR NEGATIVE NEGATIVE Final    Comment: (NOTE) The Xpert Xpress SARS-CoV-2/FLU/RSV plus assay is intended as an aid in the diagnosis of influenza from Nasopharyngeal swab specimens and should not be used as a sole basis for treatment. Nasal washings and aspirates are unacceptable for Xpert Xpress SARS-CoV-2/FLU/RSV testing.  Fact Sheet for Patients: EntrepreneurPulse.com.au  Fact Sheet for Healthcare Providers: IncredibleEmployment.be  This test is not yet approved or cleared by the [place] FDA and has been authorized for detection and/or diagnosis of SARS-CoV-2 by FDA under an Emergency Use Authorization (EUA). This EUA will remain in effect (meaning this test can be used) for the duration of the COVID-19 declaration under Section 564(b)(1) of the Act, 21 U.S.C. section 360bbb-3(b)(1), unless the authorization is terminated or revoked.  Performed at [facility], [address], [place], [place] [address]      Labs: BNP (last 3 results) No results for input(s): BNP in the last 8760 hours. Basic Metabolic Panel: Recent Labs  Lab 01/18/20 1830 01/19/20 0508 01/20/20 0504 01/21/20 0606 01/22/20 0533  NA 132* 132* 134* 133* 136  K 4.1 4.0 4.1 4.0 4.0  CL 99 103 105 105 108  CO2 26 21* '22 23 22  ' GLUCOSE 104* 100* 90 140* 118*  BUN '10 10 9 9 9  ' CREATININE 0.65 0.58 0.62 0.50 0.47  CALCIUM 8.2* 7.9* 7.9* 7.9* 8.2*  MG  --  1.9  --   --   --   PHOS  --  3.5  --   --   --    Liver Function Tests: Recent Labs  Lab 01/18/20 1830 01/19/20 0508 01/20/20 0504 01/21/20 0606 01/22/20 0533  AST 528* 484* 357* 374* 399*  ALT 142* 135* 101* 115* 152*  ALKPHOS 50 31* 28* 37* 35*  BILITOT 0.1* 0.3 0.4 0.4 0.4  PROT 6.3* 5.7* 4.9* 5.1* 5.3*  ALBUMIN 3.1* 2.7* 2.4* 2.5* 2.5*   No results for input(s): LIPASE, AMYLASE in the last 168 hours. No results for input(s): AMMONIA in the last 168  hours. CBC: Recent Labs  Lab 01/18/20 1830 01/19/20 0508  WBC 8.1 6.8  NEUTROABS 5.7  --   HGB 14.8 13.7  HCT 45.3 41.7  MCV 88.1 87.6  PLT 300 250   Cardiac Enzymes: Recent Labs  Lab 01/18/20 1830 01/19/20 0508 01/20/20 0504 01/21/20 0606 01/22/20 0533  CKTOTAL 31,230* 24,057* 15,950* 12,386* 12,199*   BNP: Invalid input(s): POCBNP CBG: No results for input(s): GLUCAP in the last 168 hours. D-Dimer No results for  input(s): DDIMER in the last 72 hours. Hgb A1c No results for input(s): HGBA1C in the last 72 hours. Lipid Profile No results for input(s): CHOL, HDL, LDLCALC, TRIG, CHOLHDL, LDLDIRECT in the last 72 hours. Thyroid function studies No results for input(s): TSH, T4TOTAL, T3FREE, THYROIDAB in the last 72 hours.  Invalid input(s): FREET3 Anemia work up No results for input(s): VITAMINB12, FOLATE, FERRITIN, TIBC, IRON, RETICCTPCT in the last 72 hours. Urinalysis    Component Value Date/Time   COLORURINE YELLOW 01/18/2020 1756   APPEARANCEUR HAZY (A) 01/18/2020 1756   LABSPEC 1.028 01/18/2020 1756   PHURINE 5.0 01/18/2020 1756   GLUCOSEU NEGATIVE 01/18/2020 1756   HGBUR MODERATE (A) 01/18/2020 1756   BILIRUBINUR NEGATIVE 01/18/2020 1756   KETONESUR NEGATIVE 01/18/2020 1756   PROTEINUR 100 (A) 01/18/2020 1756   UROBILINOGEN 0.2 03/30/2013 1044   NITRITE NEGATIVE 01/18/2020 1756   LEUKOCYTESUR NEGATIVE 01/18/2020 1756   Sepsis Labs Invalid input(s): PROCALCITONIN,  WBC,  LACTICIDVEN Microbiology Recent Results (from the past 240 hour(s))  Resp Panel by RT-PCR (Flu A&B, Covid) Nasopharyngeal Swab     Status: None   Collection Time: 01/18/20  5:57 PM   Specimen: Nasopharyngeal Swab; Nasopharyngeal(NP) swabs in vial transport medium  Result Value Ref Range Status   SARS Coronavirus 2 by RT PCR NEGATIVE NEGATIVE Final    Comment: (NOTE) SARS-CoV-2 target nucleic acids are NOT DETECTED.  The SARS-CoV-2 RNA is generally detectable in upper  respiratory specimens during the acute phase of infection. The lowest concentration of SARS-CoV-2 viral copies this assay can detect is 138 copies/mL. A negative result does not preclude SARS-Cov-2 infection and should not be used as the sole basis for treatment or other patient management decisions. A negative result may occur with  improper specimen collection/handling, submission of specimen other than nasopharyngeal swab, presence of viral mutation(s) within the areas targeted by this assay, and inadequate number of viral copies(<138 copies/mL). A negative result must be combined with clinical observations, patient history, and epidemiological information. The expected result is Negative.  Fact Sheet for Patients:  EntrepreneurPulse.com.au  Fact Sheet for Healthcare Providers:  IncredibleEmployment.be  This test is no t yet approved or cleared by the [place] FDA and  has been authorized for detection and/or diagnosis of SARS-CoV-2 by FDA under an Emergency Use Authorization (EUA). This EUA will remain  in effect (meaning this test can be used) for the duration of the COVID-19 declaration under Section 564(b)(1) of the Act, 21 U.S.C.section 360bbb-3(b)(1), unless the authorization is terminated  or revoked sooner.       Influenza A by PCR NEGATIVE NEGATIVE Final   Influenza B by PCR NEGATIVE NEGATIVE Final    Comment: (NOTE) The Xpert Xpress SARS-CoV-2/FLU/RSV plus assay is intended as an aid in the diagnosis of influenza from Nasopharyngeal swab specimens and should not be used as a sole basis for treatment. Nasal washings and aspirates are unacceptable for Xpert Xpress SARS-CoV-2/FLU/RSV testing.  Fact Sheet for Patients: EntrepreneurPulse.com.au  Fact Sheet for Healthcare Providers: IncredibleEmployment.be  This test is not yet approved or cleared by the [place] FDA and has been  authorized for detection and/or diagnosis of SARS-CoV-2 by FDA under an Emergency Use Authorization (EUA). This EUA will remain in effect (meaning this test can be used) for the duration of the COVID-19 declaration under Section 564(b)(1) of the Act, 21 U.S.C. section 360bbb-3(b)(1), unless the authorization is terminated or revoked.  Performed at [facility], [address] [place], [place] [address]  Time coordinating discharge: 35 minutes  SIGNED:   Rodena Goldmann, DO [facility] 01/22/2020, 10:57 AM  If 7PM-7AM, please contact night-coverage [email or web address]

## 2020-01-23 ENCOUNTER — Telehealth: Payer: Self-pay

## 2020-01-23 NOTE — Telephone Encounter (Signed)
Transition Care Management Follow-up Telephone Call  Date of discharge and from where: 01/22/2020 Haley Sandoval  How have you been since you were released from the hospital? Still in pain.   Any questions or concerns? No  Items Reviewed:  Did the pt receive and understand the discharge instructions provided? Yes   Medications obtained and verified? Yes  Other? No   Any new allergies since your discharge? No   Dietary orders reviewed? Yes  Do you have support at home? Yes   Home Care and Equipment/Supplies: Were home health services ordered? yes If so, what is the name of the agency?na Has the agency set up a time to come to the patient's home? no Were any new equipment or medical supplies ordered?  Yes: Dan Humphreys What is the name of the medical supply agency?na Were you able to get the supplies/equipment? no Do you have any questions related to the use of the equipment or supplies? No  Functional Questionnaire: (I = Independent and D = Dependent) ADLs: I  Bathing/Dressing- i  Meal Prep- I  Eating- I  Maintaining continence- I  Transferring/Ambulation- I  Managing Meds- I  Follow up appointments reviewed:   PCP Hospital f/u appt confirmed? Yes  Scheduled to see [facility] on [address]  on 12/30/2019 @ 8:30am.  Specialist Hospital f/u appt confirmed? Yes  Scheduled to see Melonie Florida on 03/05/2020 @ 8am.  Are transportation arrangements needed? No   If their condition worsens, is the pt aware to call PCP or go to the Emergency Dept.? Yes  Was the patient provided with contact information for the PCP's office or ED? Yes  Was to pt encouraged to call back with questions or concerns? Yes

## 2020-01-29 ENCOUNTER — Ambulatory Visit: Payer: Medicaid Other | Admitting: Nurse Practitioner

## 2020-01-29 ENCOUNTER — Other Ambulatory Visit: Payer: Self-pay

## 2020-01-29 VITALS — BP 132/84 | HR 127 | Temp 97.0°F | Ht 65.0 in | Wt 296.0 lb

## 2020-01-29 DIAGNOSIS — L819 Disorder of pigmentation, unspecified: Secondary | ICD-10-CM

## 2020-01-29 DIAGNOSIS — M6282 Rhabdomyolysis: Secondary | ICD-10-CM

## 2020-01-29 DIAGNOSIS — R748 Abnormal levels of other serum enzymes: Secondary | ICD-10-CM

## 2020-01-29 NOTE — Progress Notes (Signed)
'@Patient'  ID: Haley Sandoval, female    DOB: May 17, 2000, 19 y.o.   MRN: [id number]  Chief Complaint  Patient presents with  . Hospitalization Follow-up    Patient stated she is still feeling the same, in pain. Did not finish doxycycline.     Referring provider: Halford Chessman, MD   19 year old female with history of obesity.  HPI  Patient presents today for transition of care visit.  Patient was recently admitted to the hospital on 01/18/2020 through 01/22/2020. Work-up in the ED, showed normal CBC and BMP except for mild hyponatremia, hypoalbuminemia, elevated liver enzymes, CK 31,230, CRP 3.3, ESR 15, acetaminophen level <10,salicylate FYTWK<4.6, urinalysis was unimpressive for UTI. CT abdomen and pelvis with contrast showedmild, nonspecific bilateral posterior abdominal and pelvic inflammatory fat stranding which was thought to represent sequelae associated with mild mesenteritis and/or pelvic inflammatory disease.   Patient was admitted with nontraumatic rhabdomyolysis with generalized myalgia and weakness that appeared to be related to concerns of myositis.  She demonstrated some improvement with use of IV steroids as well as IV fluids and CK level still remain around 12,000.  Her renal function is stable.  LFTs remain stable as well and she has been seen by GI with plans to follow-up in the outpatient setting to reevaluate LFTs. Patient continued to have considerable amounts of pain, but was otherwise stable for discharge. Patient was prescribed prednisone taper as well as narcotic pain medications and muscle relaxers.  She was also noted to have some findings of PID noted on CT abdomen pelvis for which she was prescribed doxycycline.  No other acute events or concerns noted throughout the course of this admission. Patient was scheduled to see GI outpatient to follow up on elevated liver enzymes. Patient was scheduled outpatient PT to help with recovery.  Since hospital discharge patient  states that she has had no improvement. She cannot perform activities of daily living without assistance. We discussed that we will place a referral for rheumatology for further evaluation for suspected myositis as requested in ED note. We will recheck labs today. Patient does have upcoming appointments scheduled with PT and GI. Patient states that she did not complete entire course of doxycycline, but has been taking all other D/C prescriptions. She does not need refills at this time. Patient does need assistance with transportation to upcoming appointments. We discussed that we will get her an appointment to establish care with primary care provider at [facility] tomorrow and she can also meet with the social worker there to get help with her transportation needs. Will place referral to rheumatology is urgent. Patient does have an upcoming appointment scheduled with OB/GYN as well.  Patient complains today of ongoing skin lesion to her right buttock. She states that she has been having issues with this since March 2021. She did go to urgent care a few weeks before seen for this hospital admission and was prescribed doxycycline at that time. She states that the area has not improved. Upon exam she does have a large discolored lesion to her right buttock that does have irritation noted around the edges. We discussed that I will place a referral to dermatology for her to have this further evaluated.   Denies f/c/s, n/v/d, hemoptysis, PND, chest pain.     No Known Allergies   There is no immunization history on file for this patient.  Past Medical History:  Diagnosis Date  . Seasonal allergies     Tobacco History: Social History   Tobacco  Use  Smoking Status Never Smoker  Smokeless Tobacco Never Used   Counseling given: Yes   Outpatient Encounter Medications as of 01/29/2020  Medication Sig  . ibuprofen (ADVIL,MOTRIN) 200 MG tablet Take 200 mg by mouth every 6 (six) hours as needed.  .  methocarbamol (ROBAXIN) 500 MG tablet Take 1 tablet (500 mg total) by mouth every 6 (six) hours as needed for muscle spasms.  Marland Kitchen oxyCODONE (OXY IR/ROXICODONE) 5 MG immediate release tablet Take 1 tablet (5 mg total) by mouth every 4 (four) hours as needed for moderate pain or severe pain.  . predniSONE (DELTASONE) 10 MG tablet Take 6 tablets (60 mg total) by mouth daily for 7 days, THEN 5 tablets (50 mg total) daily for 7 days, THEN 4 tablets (40 mg total) daily for 7 days, THEN 3.5 tablets (35 mg total) daily for 7 days, THEN 3 tablets (30 mg total) daily for 7 days, THEN 2.5 tablets (25 mg total) daily for 7 days, THEN 2 tablets (20 mg total) daily for 7 days, THEN 1.5 tablets (15 mg total) daily for 7 days, THEN 1 tablet (10 mg total) daily for 7 days, THEN 0.5 tablets (5 mg total) daily for 7 days.   No facility-administered encounter medications on file as of 01/29/2020.     Review of Systems  Review of Systems  Constitutional: Positive for activity change.  HENT: Negative.   Respiratory: Negative for cough and shortness of breath.   Cardiovascular: Negative.  Negative for chest pain and palpitations.  Gastrointestinal: Negative.   Musculoskeletal: Positive for myalgias.  Skin:       Lesion to right buttock.   Allergic/Immunologic: Negative.   Neurological: Positive for weakness.  Psychiatric/Behavioral: Negative.        Physical Exam  BP 132/84 (BP Location: Right Arm)   Pulse (!) 127   Temp (!) 97 F (36.1 C)   Ht '5\' 5"'  (1.651 m)   Wt 296 lb 0.1 oz (134.3 kg)   SpO2 98%   BMI 49.26 kg/m   Wt Readings from Last 5 Encounters:  01/29/20 296 lb 0.1 oz (134.3 kg)  01/18/20 270 lb (122.5 kg) (>99 %, Z= 2.68)*  11/25/19 278 lb 12.8 oz (126.5 kg) (>99 %, Z= 2.73)*  11/25/19 278 lb 12.8 oz (126.5 kg) (>99 %, Z= 2.73)*  09/09/19 263 lb (119.3 kg) (>99 %, Z= 2.61)*   * Growth percentiles are based on CDC (Girls, 2-20 Years) data.     Physical Exam Vitals and nursing note  reviewed.  Constitutional:      General: She is not in acute distress.    Appearance: She is well-developed and well-nourished. She is obese.  Cardiovascular:     Rate and Rhythm: Normal rate and regular rhythm.  Pulmonary:     Effort: Pulmonary effort is normal.     Breath sounds: Normal breath sounds.  Musculoskeletal:        General: Swelling (bilateral hands) present.  Skin:    Comments: Discolored lesion noted to right buttock with irritation noted around edges.   Neurological:     Mental Status: She is alert and oriented to person, place, and time.     Motor: Weakness present.  Psychiatric:        Mood and Affect: Mood and affect and mood normal.        Behavior: Behavior normal.      Imaging: CT ABDOMEN PELVIS W CONTRAST  Result Date: 01/18/2020 CLINICAL DATA:  Body aches. EXAM:  CT ABDOMEN AND PELVIS WITH CONTRAST TECHNIQUE: Multidetector CT imaging of the abdomen and pelvis was performed using the standard protocol following bolus administration of intravenous contrast. CONTRAST:  129m OMNIPAQUE IOHEXOL 300 MG/ML  SOLN COMPARISON:  None. FINDINGS: Lower chest: No acute abnormality. Hepatobiliary: No focal liver abnormality is seen. No gallstones, gallbladder wall thickening, or biliary dilatation. Pancreas: Unremarkable. No pancreatic ductal dilatation or surrounding inflammatory changes. Spleen: Normal in size without focal abnormality. Adrenals/Urinary Tract: Adrenal glands are unremarkable. Kidneys are normal, without renal calculi, focal lesion, or hydronephrosis. Bladder is unremarkable. Stomach/Bowel: Stomach is within normal limits. Appendix appears normal. No evidence of bowel wall thickening, distention, or inflammatory changes. Vascular/Lymphatic: No significant vascular findings are present. No enlarged abdominal or pelvic lymph nodes. Reproductive: Uterus is normal in appearance. Multiple ill-defined bilateral subcentimeter ovarian cysts are seen. Other: No abdominal  wall hernia or abnormality. No abdominopelvic ascites. Mild, bilateral nonspecific mesenteric inflammatory fat stranding is seen along the posterior aspects of the mid to lower abdomen. A mild amount of diffuse pelvic inflammatory fat stranding is seen. Musculoskeletal: Mild subcutaneous inflammatory fat stranding is seen along the lateral aspect of the gluteal region on the right. No acute or significant osseous findings. IMPRESSION: 1. Mild, nonspecific bilateral posterior abdominal and pelvic inflammatory fat stranding which may represent sequelae associated with mild mesenteritis and/or pelvic inflammatory disease. Electronically Signed   By: TVirgina NorfolkM.D.   On: 01/18/2020 20:38   DG Chest Portable 1 View  Result Date: 01/18/2020 CLINICAL DATA:  Fever and body aches. EXAM: PORTABLE CHEST 1 VIEW COMPARISON:  Jul 11, 2013 FINDINGS: The heart size and mediastinal contours are within normal limits. Both lungs are clear. The visualized skeletal structures are unremarkable. IMPRESSION: No active disease. Electronically Signed   By: TVirgina NorfolkM.D.   On: 01/18/2020 18:21     Assessment & Plan:   Rhabdomyolysis Suspected myositis:  Will place referral to Rheumatology  Will schedule appointment to establish care with PCP  Stay active  Stay well hydrated  Please complete steroid taper as prescribed at ED discharge  Will check repeat labs - note: unable to draw blood in office today - may get blood drawn at PCP visit tomorrow with CDionisio David NP.   Will schedule an appointment to establish care with PCP tomorrow at 2:00    LFT elevation:  Please keep upcoming appointment with GI   Skin lesion:  Keep area clean and dry  May apply Vaseline  Will place referral to dermatology     Patient Instructions  Nontraumatic rhabdomyolysis: Suspected myositis:  Will place referral to Rheumatology  Will schedule appointment to establish care with PCP  Stay  active  Stay well hydrated  Please complete steroid taper as prescribed at ED discharge  Will check repeat labs - note: unable to draw blood in office today - may get blood drawn at PCP visit tomorrow with CDionisio David NP.   Will schedule an appointment to establish care with PCP tomorrow at 2:00    LFT elevation:  Please keep upcoming appointment with GI   Skin lesion:  Keep area clean and dry  May apply Vaseline  Will place referral to dermatology      Rhabdomyolysis Rhabdomyolysis is a condition that happens when muscle cells break down and release substances into the blood that can damage the kidneys. This condition happens because of damage to the muscles that move bones (skeletal muscle). When the skeletal muscles are damaged, substances inside the muscle  cells go into the blood. One of these substances is a protein called myoglobin. Large amounts of myoglobin can cause kidney damage or kidney failure. Other substances that are released by muscle cells may upset the balance of the minerals (electrolytes) in your blood. This imbalance causes your blood to have too much acid (acidosis). What are the causes? This condition is caused by muscle damage. Muscle damage often happens because of:  Using your muscles too much.  An injury that crushes or squeezes a muscle too tightly.  Using illegal drugs, mainly cocaine.  Alcohol abuse. Other possible causes include:  Prescription medicines, such as those that: ? Lower cholesterol (statins). ? Treat ADHD (attention deficit hyperactivity disorder) or help with weight loss (amphetamines). ? Treat pain (opiates).  Infections.  Muscle diseases that are passed down from parent to child (inherited).  High fever.  Heatstroke.  Not having enough fluids in your body (dehydration).  Seizures.  Surgery. What increases the risk? This condition is more likely to develop in people who:  Have a family history of muscle  disease.  Take part in extreme sports, such as running in marathons.  Have diabetes.  Are older.  Abuse drugs or alcohol. What are the signs or symptoms? Symptoms of this condition vary. Some people have very few symptoms, and other people have many symptoms. The most common symptoms include:  Muscle pain and swelling.  Weak muscles.  Dark urine.  Feeling weak and tired. Other symptoms include:  Nausea and vomiting.  Fever.  Pain in the abdomen.  Pain in the joints. Symptoms of complications from this condition include:  Heart rhythm that is not normal (arrhythmia).  Seizures.  Not urinating enough because of kidney failure.  Very low blood pressure (shock). Signs of shock include dizziness, blurry vision, and clammy skin.  Bleeding that is hard to stop or control. How is this diagnosed? This condition is diagnosed based on your medical history, your symptoms, and a physical exam. Tests may also be done, including:  Blood tests.  Urine tests to check for myoglobin. You may also have other tests to check for causes of muscle damage and to check for complications. How is this treated? Treatment for this condition helps to:  Make sure you have enough fluids in your body.  Lower the acid levels in your blood to reverse acidosis.  Protect your kidneys. Treatment may include:  Fluids and medicines given through an IV tube that is inserted into one of your veins.  Medicines to lower acidosis or to bring back the balance of the minerals in your body.  Hemodialysis. This treatment uses an artificial kidney machine to filter your blood while you recover. You may have this if other treatments are not helping. Follow these instructions at home:   Take over-the-counter and prescription medicines only as told by your health care provider.  Rest at home until your health care provider says that you can return to your normal activities.  Drink enough fluid to keep  your urine clear or pale yellow.  Do not do activities that take a lot of effort (are strenuous). Ask your health care provider what level of exercise is safe for you.  Do not abuse drugs or alcohol. If you are having problems with drug or alcohol use, ask your health care provider for help.  Keep all follow-up visits as told by your health care provider. This is important. Contact a health care provider if:  You start having symptoms of this  condition after treatment. Get help right away if:  You have a seizure.  You bleed easily or cannot control bleeding.  You cannot urinate.  You have chest pain.  You have trouble breathing. This information is not intended to replace advice given to you by your health care provider. Make sure you discuss any questions you have with your health care provider. Document Revised: 01/19/2017 Document Reviewed: 11/19/2015 Elsevier Patient Education  2020 [place], [place] 01/29/2020

## 2020-01-29 NOTE — Patient Instructions (Addendum)
Nontraumatic rhabdomyolysis: Suspected myositis:  Will place referral to Rheumatology  Will schedule appointment to establish care with PCP  Stay active  Stay well hydrated  Please complete steroid taper as prescribed at ED discharge  Will check repeat labs - note: unable to draw blood in office today - may get blood drawn at PCP visit tomorrow with Thad Ranger, NP.   Will schedule an appointment to establish care with PCP tomorrow at 2:00    LFT elevation:  Please keep upcoming appointment with GI   Skin lesion:  Keep area clean and dry  May apply Vaseline  Will place referral to dermatology      Rhabdomyolysis Rhabdomyolysis is a condition that happens when muscle cells break down and release substances into the blood that can damage the kidneys. This condition happens because of damage to the muscles that move bones (skeletal muscle). When the skeletal muscles are damaged, substances inside the muscle cells go into the blood. One of these substances is a protein called myoglobin. Large amounts of myoglobin can cause kidney damage or kidney failure. Other substances that are released by muscle cells may upset the balance of the minerals (electrolytes) in your blood. This imbalance causes your blood to have too much acid (acidosis). What are the causes? This condition is caused by muscle damage. Muscle damage often happens because of:  Using your muscles too much.  An injury that crushes or squeezes a muscle too tightly.  Using illegal drugs, mainly cocaine.  Alcohol abuse. Other possible causes include:  Prescription medicines, such as those that: ? Lower cholesterol (statins). ? Treat ADHD (attention deficit hyperactivity disorder) or help with weight loss (amphetamines). ? Treat pain (opiates).  Infections.  Muscle diseases that are passed down from parent to child (inherited).  High fever.  Heatstroke.  Not having enough fluids in your body  (dehydration).  Seizures.  Surgery. What increases the risk? This condition is more likely to develop in people who:  Have a family history of muscle disease.  Take part in extreme sports, such as running in marathons.  Have diabetes.  Are older.  Abuse drugs or alcohol. What are the signs or symptoms? Symptoms of this condition vary. Some people have very few symptoms, and other people have many symptoms. The most common symptoms include:  Muscle pain and swelling.  Weak muscles.  Dark urine.  Feeling weak and tired. Other symptoms include:  Nausea and vomiting.  Fever.  Pain in the abdomen.  Pain in the joints. Symptoms of complications from this condition include:  Heart rhythm that is not normal (arrhythmia).  Seizures.  Not urinating enough because of kidney failure.  Very low blood pressure (shock). Signs of shock include dizziness, blurry vision, and clammy skin.  Bleeding that is hard to stop or control. How is this diagnosed? This condition is diagnosed based on your medical history, your symptoms, and a physical exam. Tests may also be done, including:  Blood tests.  Urine tests to check for myoglobin. You may also have other tests to check for causes of muscle damage and to check for complications. How is this treated? Treatment for this condition helps to:  Make sure you have enough fluids in your body.  Lower the acid levels in your blood to reverse acidosis.  Protect your kidneys. Treatment may include:  Fluids and medicines given through an IV tube that is inserted into one of your veins.  Medicines to lower acidosis or to bring back the balance  of the minerals in your body.  Hemodialysis. This treatment uses an artificial kidney machine to filter your blood while you recover. You may have this if other treatments are not helping. Follow these instructions at home:   Take over-the-counter and prescription medicines only as told by  your health care provider.  Rest at home until your health care provider says that you can return to your normal activities.  Drink enough fluid to keep your urine clear or pale yellow.  Do not do activities that take a lot of effort (are strenuous). Ask your health care provider what level of exercise is safe for you.  Do not abuse drugs or alcohol. If you are having problems with drug or alcohol use, ask your health care provider for help.  Keep all follow-up visits as told by your health care provider. This is important. Contact a health care provider if:  You start having symptoms of this condition after treatment. Get help right away if:  You have a seizure.  You bleed easily or cannot control bleeding.  You cannot urinate.  You have chest pain.  You have trouble breathing. This information is not intended to replace advice given to you by your health care provider. Make sure you discuss any questions you have with your health care provider. Document Revised: 01/19/2017 Document Reviewed: 11/19/2015 Elsevier Patient Education  2020 [employer].

## 2020-01-29 NOTE — Assessment & Plan Note (Signed)
Suspected myositis:  Will place referral to Rheumatology  Will schedule appointment to establish care with PCP  Stay active  Stay well hydrated  Please complete steroid taper as prescribed at ED discharge  Will check repeat labs - note: unable to draw blood in office today - may get blood drawn at PCP visit tomorrow with Thad Ranger, NP.   Will schedule an appointment to establish care with PCP tomorrow at 2:00    LFT elevation:  Please keep upcoming appointment with GI   Skin lesion:  Keep area clean and dry  May apply Vaseline  Will place referral to dermatology

## 2020-01-30 ENCOUNTER — Ambulatory Visit (INDEPENDENT_AMBULATORY_CARE_PROVIDER_SITE_OTHER): Payer: Medicaid Other | Admitting: Nurse Practitioner

## 2020-01-30 ENCOUNTER — Encounter: Payer: Self-pay | Admitting: Nurse Practitioner

## 2020-01-30 ENCOUNTER — Encounter (HOSPITAL_COMMUNITY): Payer: Self-pay

## 2020-01-30 ENCOUNTER — Other Ambulatory Visit: Payer: Self-pay

## 2020-01-30 ENCOUNTER — Ambulatory Visit: Payer: Medicaid Other | Admitting: Nurse Practitioner

## 2020-01-30 ENCOUNTER — Inpatient Hospital Stay (HOSPITAL_COMMUNITY)
Admission: EM | Admit: 2020-01-30 | Discharge: 2020-02-16 | DRG: 872 | Disposition: A | Discharge status: Home Health | Payer: Medicaid Other | Attending: Internal Medicine | Admitting: Internal Medicine

## 2020-01-30 VITALS — BP 145/97 | HR 124 | Temp 97.3°F | Ht 65.0 in | Wt 297.0 lb

## 2020-01-30 DIAGNOSIS — Z8249 Family history of ischemic heart disease and other diseases of the circulatory system: Secondary | ICD-10-CM

## 2020-01-30 DIAGNOSIS — R3129 Other microscopic hematuria: Secondary | ICD-10-CM

## 2020-01-30 DIAGNOSIS — L0291 Cutaneous abscess, unspecified: Secondary | ICD-10-CM

## 2020-01-30 DIAGNOSIS — Z6841 Body Mass Index (BMI) 40.0 and over, adult: Secondary | ICD-10-CM

## 2020-01-30 DIAGNOSIS — A419 Sepsis, unspecified organism: Principal | ICD-10-CM | POA: Diagnosis present

## 2020-01-30 DIAGNOSIS — R21 Rash and other nonspecific skin eruption: Secondary | ICD-10-CM | POA: Diagnosis present

## 2020-01-30 DIAGNOSIS — S31819D Unspecified open wound of right buttock, subsequent encounter: Secondary | ICD-10-CM

## 2020-01-30 DIAGNOSIS — R601 Generalized edema: Secondary | ICD-10-CM

## 2020-01-30 DIAGNOSIS — N739 Female pelvic inflammatory disease, unspecified: Secondary | ICD-10-CM | POA: Diagnosis present

## 2020-01-30 DIAGNOSIS — R7989 Other specified abnormal findings of blood chemistry: Secondary | ICD-10-CM | POA: Diagnosis present

## 2020-01-30 DIAGNOSIS — Z7689 Persons encountering health services in other specified circumstances: Secondary | ICD-10-CM

## 2020-01-30 DIAGNOSIS — J302 Other seasonal allergic rhinitis: Secondary | ICD-10-CM | POA: Diagnosis present

## 2020-01-30 DIAGNOSIS — R748 Abnormal levels of other serum enzymes: Secondary | ICD-10-CM | POA: Diagnosis present

## 2020-01-30 DIAGNOSIS — R109 Unspecified abdominal pain: Secondary | ICD-10-CM

## 2020-01-30 DIAGNOSIS — R7401 Elevation of levels of liver transaminase levels: Secondary | ICD-10-CM | POA: Diagnosis present

## 2020-01-30 DIAGNOSIS — N644 Mastodynia: Secondary | ICD-10-CM | POA: Diagnosis present

## 2020-01-30 DIAGNOSIS — M6282 Rhabdomyolysis: Secondary | ICD-10-CM | POA: Diagnosis not present

## 2020-01-30 DIAGNOSIS — K612 Anorectal abscess: Secondary | ICD-10-CM | POA: Diagnosis present

## 2020-01-30 DIAGNOSIS — N730 Acute parametritis and pelvic cellulitis: Secondary | ICD-10-CM

## 2020-01-30 DIAGNOSIS — R6520 Severe sepsis without septic shock: Secondary | ICD-10-CM | POA: Diagnosis present

## 2020-01-30 DIAGNOSIS — R000 Tachycardia, unspecified: Secondary | ICD-10-CM | POA: Diagnosis not present

## 2020-01-30 DIAGNOSIS — E8770 Fluid overload, unspecified: Secondary | ICD-10-CM | POA: Diagnosis present

## 2020-01-30 DIAGNOSIS — L0231 Cutaneous abscess of buttock: Secondary | ICD-10-CM | POA: Diagnosis not present

## 2020-01-30 DIAGNOSIS — R030 Elevated blood-pressure reading, without diagnosis of hypertension: Secondary | ICD-10-CM

## 2020-01-30 DIAGNOSIS — D649 Anemia, unspecified: Secondary | ICD-10-CM | POA: Diagnosis present

## 2020-01-30 DIAGNOSIS — N611 Abscess of the breast and nipple: Secondary | ICD-10-CM

## 2020-01-30 DIAGNOSIS — Z20822 Contact with and (suspected) exposure to covid-19: Secondary | ICD-10-CM | POA: Diagnosis present

## 2020-01-30 DIAGNOSIS — M609 Myositis, unspecified: Secondary | ICD-10-CM | POA: Diagnosis present

## 2020-01-30 DIAGNOSIS — E876 Hypokalemia: Secondary | ICD-10-CM | POA: Diagnosis present

## 2020-01-30 DIAGNOSIS — L0390 Cellulitis, unspecified: Secondary | ICD-10-CM | POA: Diagnosis present

## 2020-01-30 DIAGNOSIS — Z79899 Other long term (current) drug therapy: Secondary | ICD-10-CM

## 2020-01-30 DIAGNOSIS — I10 Essential (primary) hypertension: Secondary | ICD-10-CM | POA: Diagnosis present

## 2020-01-30 LAB — CBC WITH DIFFERENTIAL/PLATELET
Abs Immature Granulocytes: 0.77 10*3/uL — ABNORMAL HIGH (ref 0.00–0.07)
Basophils Absolute: 0.1 10*3/uL (ref 0.0–0.1)
Basophils Relative: 1 %
Eosinophils Absolute: 0 10*3/uL (ref 0.0–0.5)
Eosinophils Relative: 0 %
HCT: 39.9 % (ref 36.0–46.0)
Hemoglobin: 13.1 g/dL (ref 12.0–15.0)
Immature Granulocytes: 4 %
Lymphocytes Relative: 9 %
Lymphs Abs: 1.8 10*3/uL (ref 0.7–4.0)
MCH: 28.6 pg (ref 26.0–34.0)
MCHC: 32.8 g/dL (ref 30.0–36.0)
MCV: 87.1 fL (ref 80.0–100.0)
Monocytes Absolute: 1.8 10*3/uL — ABNORMAL HIGH (ref 0.1–1.0)
Monocytes Relative: 9 %
Neutro Abs: 15.9 10*3/uL — ABNORMAL HIGH (ref 1.7–7.7)
Neutrophils Relative %: 77 %
Platelets: 367 10*3/uL (ref 150–400)
RBC: 4.58 MIL/uL (ref 3.87–5.11)
RDW: 15.5 % (ref 11.5–15.5)
WBC: 20.4 10*3/uL — ABNORMAL HIGH (ref 4.0–10.5)
nRBC: 0 % (ref 0.0–0.2)

## 2020-01-30 LAB — POCT GLYCOSYLATED HEMOGLOBIN (HGB A1C): Hemoglobin A1C: 5.4 % (ref 4.0–5.6)

## 2020-01-30 LAB — COMPREHENSIVE METABOLIC PANEL
ALT: 78 U/L — ABNORMAL HIGH (ref 0–44)
AST: 270 U/L — ABNORMAL HIGH (ref 15–41)
Albumin: 2.9 g/dL — ABNORMAL LOW (ref 3.5–5.0)
Alkaline Phosphatase: 53 U/L (ref 38–126)
Anion gap: 10 (ref 5–15)
BUN: 14 mg/dL (ref 6–20)
CO2: 23 mmol/L (ref 22–32)
Calcium: 8.5 mg/dL — ABNORMAL LOW (ref 8.9–10.3)
Chloride: 100 mmol/L (ref 98–111)
Creatinine, Ser: 0.54 mg/dL (ref 0.44–1.00)
GFR, Estimated: >60 mL/min (ref >60)
Glucose, Bld: 118 mg/dL — ABNORMAL HIGH (ref 70–99)
Potassium: 4 mmol/L (ref 3.5–5.1)
Sodium: 133 mmol/L — ABNORMAL LOW (ref 135–145)
Total Bilirubin: 0.4 mg/dL (ref 0.3–1.2)
Total Protein: 6.3 g/dL — ABNORMAL LOW (ref 6.5–8.1)

## 2020-01-30 LAB — LACTIC ACID, PLASMA
Lactic Acid, Venous: 2.2 mmol/L (ref 0.5–1.9)
Lactic Acid, Venous: 1.8 mmol/L (ref 0.5–1.9)

## 2020-01-30 LAB — CK: Total CK: 11283 U/L — ABNORMAL HIGH (ref 38–234)

## 2020-01-30 LAB — URINALYSIS, ROUTINE W REFLEX MICROSCOPIC
Bilirubin Urine: NEGATIVE
Glucose, UA: NEGATIVE mg/dL
Ketones, ur: NEGATIVE mg/dL
Leukocytes,Ua: NEGATIVE
Nitrite: NEGATIVE
Protein, ur: 30 mg/dL — AB
Specific Gravity, Urine: 1.029 (ref 1.005–1.030)
pH: 5 (ref 5.0–8.0)

## 2020-01-30 LAB — POCT URINALYSIS DIP (CLINITEK)
Bilirubin, UA: NEGATIVE
Glucose, UA: NEGATIVE mg/dL
Leukocytes, UA: NEGATIVE
Nitrite, UA: NEGATIVE
POC PROTEIN,UA: 30 — AB
Spec Grav, UA: >=1.03 — AB (ref 1.010–1.025)
Urobilinogen, UA: 0.2 E.U./dL
pH, UA: 5.5 (ref 5.0–8.0)

## 2020-01-30 LAB — I-STAT BETA HCG BLOOD, ED (MC, WL, AP ONLY): I-stat hCG, quantitative: <5 m[IU]/mL (ref <5)

## 2020-01-30 LAB — PROTIME-INR
INR: 1 (ref 0.8–1.2)
Prothrombin Time: 12.7 seconds (ref 11.4–15.2)

## 2020-01-30 LAB — APTT: aPTT: 28 seconds (ref 24–36)

## 2020-01-30 MED ORDER — SODIUM CHLORIDE 0.9 % IV BOLUS (SEPSIS)
1000.0000 mL | Freq: Once | INTRAVENOUS | Status: AC
  Administered 2020-01-30: 1000 mL via INTRAVENOUS

## 2020-01-30 MED ORDER — MORPHINE SULFATE (PF) 4 MG/ML IV SOLN
4.0000 mg | Freq: Once | INTRAVENOUS | Status: AC
  Administered 2020-01-30: 4 mg via INTRAVENOUS
  Filled 2020-01-30: qty 1

## 2020-01-30 MED ORDER — LIDOCAINE-EPINEPHRINE 2 %-1:100000 IJ SOLN
20.0000 mL | Freq: Once | INTRAMUSCULAR | Status: AC
  Administered 2020-01-30: 20 mL
  Filled 2020-01-30: qty 1

## 2020-01-30 MED ORDER — CEFAZOLIN SODIUM-DEXTROSE 1-4 GM/50ML-% IV SOLN
1.0000 g | Freq: Once | INTRAVENOUS | Status: AC
  Administered 2020-01-30: 1 g via INTRAVENOUS
  Filled 2020-01-30: qty 50

## 2020-01-30 NOTE — ED Notes (Signed)
Patient is a difficult stick. RN at bedside attempting USIV.

## 2020-01-30 NOTE — Patient Instructions (Addendum)
Wound Infection A wound infection happens when germs start to grow in a wound. Germs that cause wound infections are most often bacteria. Other types of infections can occur as well. An infection can cause the wound to break open. Wound infections need treatment. If a wound infection is not treated, problems can happen. What are the causes?  Most often caused by germs (bacteria) that grow in a wound.  Other germs, such as yeast and funguses, can also cause wound infections. What increases the risk?  Having a weak body defense system (immune system).  Having diabetes.  Taking certain medicines (steroids) for a long time.  Smoking.  Being an older person.  Being overweight.  Taking certain medicines for cancer treatment. What are the signs or symptoms?  Having more redness, swelling, or pain at the wound site.  Having more blood or fluid at the wound site.  A bad smell coming from a wound or bandage (dressing).  Having a fever.  Feeling very tired.  Having warmth at or around the wound.  Having pus at the wound site. How is this treated?  This condition is most often treated with an antibiotic medicine. ? The infection should improve 24-48 hours after you start antibiotics. ? After 24-48 hours, redness around the wound should stop spreading. The wound should also be less painful. Follow these instructions at home: Medicines  Take or apply over-the-counter and prescription medicines only as told by your doctor.  If you were prescribed an antibiotic medicine, take or apply it as told by your doctor. Do not stop using the antibiotic even if you start to feel better. Wound care   Clean the wound each day, or as told by your doctor. ? Wash the wound with mild soap and water. ? Rinse the wound with water to remove all soap. ? Pat the wound dry with a clean towel. Do not rub it.  Follow instructions from your doctor about how to take care of your wound. Make sure  you: ? Wash your hands with soap and water before and after you change your bandage. If you cannot use soap and water, use hand sanitizer. ? Change your bandage as told by your doctor. ? Leave stitches (sutures), skin glue, or skin tape (adhesive) strips in place if your wound has been closed. They may need to stay in place for 2 weeks or longer. If tape strips get loose and curl up, you may trim the loose edges. Do not remove tape strips completely unless your doctor says it is okay. Some wounds are left open to heal on their own.  Check your wound every day for signs of infection. Watch for: ? More redness, swelling, or pain. ? More fluid or blood. ? Warmth. ? Pus or a bad smell. General instructions  Keep the bandage dry until your doctor says it can be removed.  Do not take baths, swim, or use a hot tub until your doctor approves. Ask your doctor if you may take showers. You may only be allowed to take sponge baths.  Raise (elevate) the injured area above the level of your heart while you are sitting or lying down.  Do not scratch or pick at the wound.  Keep all follow-up visits as told by your doctor. This is important. Contact a doctor if:  Medicine does not help your pain.  You have more redness, swelling, or pain around your wound.  You have more fluid or blood coming from your wound.    Your wound feels warm to the touch.  You have pus coming from your wound.  You notice a bad smell coming from your wound or your bandage.  Your wound that was closed breaks open. Get help right away if:  You have a red streak going away from your wound.  You have a fever. Summary  A wound infection happens when germs start to grow in a wound.  This condition is usually treated with an antibiotic medicine.  Follow instructions from your doctor about how to take care of your wound.  Contact a doctor if your wound infection does not start to get better in 24-48 hours, or your  symptoms get worse.  Keep all follow-up visits as told by your doctor. This is important. This information is not intended to replace advice given to you by your health care provider. Make sure you discuss any questions you have with your health care provider. Document Revised: 09/18/2017 Document Reviewed: 09/18/2017 Elsevier Patient Education  2020 [employer]   Sinus Tachycardia  Sinus tachycardia is a kind of fast heartbeat. In sinus tachycardia, the heart beats more than 100 times a minute. Sinus tachycardia starts in a part of the heart called the sinus node. Sinus tachycardia may be harmless, or it may be a sign of a serious condition. What are the causes? This condition may be caused by:  Exercise or exertion.  A fever.  Pain.  Loss of body fluids (dehydration).  Severe bleeding (hemorrhage).  Anxiety and stress.  Certain substances, including: ? Alcohol. ? Caffeine. ? Tobacco and nicotine products. ? Cold medicines. ? Illegal drugs.  Medical conditions including: ? Heart disease. ? An infection. ? An overactive thyroid (hyperthyroidism). ? A lack of red blood cells (anemia). What are the signs or symptoms? Symptoms of this condition include:  A feeling that the heart is beating quickly (palpitations).  Suddenly noticing your heartbeat (cardiac awareness).  Dizziness.  Tiredness (fatigue).  Shortness of breath.  Chest pain.  Nausea.  Fainting. How is this diagnosed? This condition is diagnosed with:  A physical exam.  Other tests, such as: ? Blood tests. ? An electrocardiogram (ECG). This test measures the electrical activity of the heart. ? Ambulatory cardiac monitor. This records your heartbeats for 24 hours or more. You may be referred to a heart specialist (cardiologist). How is this treated? Treatment for this condition depends on the cause or the underlying condition. Treatment may involve:  Treating the underlying  condition.  Taking new medicines or changing your current medicines as told by your health care provider.  Making changes to your diet or lifestyle. Follow these instructions at home: Lifestyle   Do not use any products that contain nicotine or tobacco, such as cigarettes and e-cigarettes. If you need help quitting, ask your health care provider.  Do not use illegal drugs, such as cocaine.  Learn relaxation methods to help you when you get stressed or anxious. These include deep breathing.  Avoid caffeine or other stimulants. Alcohol use   Do not drink alcohol if: ? Your health care provider tells you not to drink. ? You are pregnant, may be pregnant, or are planning to become pregnant.  If you drink alcohol, limit how much you have: ? 0-1 drink a day for women. ? 0-2 drinks a day for men.  Be aware of how much alcohol is in your drink. In the [place] one drink equals one typical bottle of beer (12 oz), one-half glass of wine (5  oz), or one shot of hard liquor (1 oz). General instructions  Drink enough fluids to keep your urine pale yellow.  Take over-the-counter and prescription medicines only as told by your health care provider.  Keep all follow-up visits as told by your health care provider. This is important. Contact a health care provider if you have:  A fever.  Vomiting or diarrhea that does not go away. Get help right away if you:  Have pain in your chest, upper arms, jaw, or neck.  Become weak or dizzy.  Feel faint.  Have palpitations that do not go away. Summary  In sinus tachycardia, the heart beats more than 100 times a minute.  Sinus tachycardia may be harmless, or it may be a sign of a serious condition.  Treatment for this condition depends on the cause or the underlying condition.  Get help right away if you have pain in your chest, upper arms, jaw, or neck. This information is not intended to replace advice given to you by your health care  provider. Make sure you discuss any questions you have with your health care provider. Document Revised: 03/28/2017 Document Reviewed: 03/28/2017 Elsevier Patient Education  2020 [employer] Inc.   Cellulitis, Adult  Cellulitis is a skin infection. The infected area is usually warm, red, swollen, and tender. This condition occurs most often in the arms and lower legs. The infection can travel to the muscles, blood, and underlying tissue and become serious. It is very important to get treated for this condition. What are the causes? Cellulitis is caused by bacteria. The bacteria enter through a break in the skin, such as a cut, burn, insect bite, open sore, or crack. What increases the risk? This condition is more likely to occur in people who:  Have a weak body defense system (immune system).  Have open wounds on the skin, such as cuts, burns, bites, and scrapes. Bacteria can enter the body through these open wounds.  Are older than 20 years of age.  Have diabetes.  Have a type of long-lasting (chronic) liver disease (cirrhosis) or kidney disease.  Are obese.  Have a skin condition such as: ? Itchy rash (eczema). ? Slow movement of blood in the veins (venous stasis). ? Fluid buildup below the skin (edema).  Have had radiation therapy.  Use IV drugs. What are the signs or symptoms? Symptoms of this condition include:  Redness, streaking, or spotting on the skin.  Swollen area of the skin.  Tenderness or pain when an area of the skin is touched.  Warm skin.  A fever.  Chills.  Blisters. How is this diagnosed? This condition is diagnosed based on a medical history and physical exam. You may also have tests, including:  Blood tests.  Imaging tests. How is this treated? Treatment for this condition may include:  Medicines, such as antibiotic medicines or medicines to treat allergies (antihistamines).  Supportive care, such as rest and application of cold or warm  cloths (compresses) to the skin.  Hospital care, if the condition is severe. The infection usually starts to get better within 1-2 days of treatment. Follow these instructions at home:  Medicines  Take over-the-counter and prescription medicines only as told by your health care provider.  If you were prescribed an antibiotic medicine, take it as told by your health care provider. Do not stop taking the antibiotic even if you start to feel better. General instructions  Drink enough fluid to keep your urine pale yellow.  Do  not touch or rub the infected area.  Raise (elevate) the infected area above the level of your heart while you are sitting or lying down.  Apply warm or cold compresses to the affected area as told by your health care provider.  Keep all follow-up visits as told by your health care provider. This is important. These visits let your health care provider make sure a more serious infection is not developing. Contact a health care provider if:  You have a fever.  Your symptoms do not begin to improve within 1-2 days of starting treatment.  Your bone or joint underneath the infected area becomes painful after the skin has healed.  Your infection returns in the same area or another area.  You notice a swollen bump in the infected area.  You develop new symptoms.  You have a general ill feeling (malaise) with muscle aches and pains. Get help right away if:  Your symptoms get worse.  You feel very sleepy.  You develop vomiting or diarrhea that persists.  You notice red streaks coming from the infected area.  Your red area gets larger or turns dark in color. These symptoms may represent a serious problem that is an emergency. Do not wait to see if the symptoms will go away. Get medical help right away. Call your local emergency services (911 in the [place] Do not drive yourself to the hospital. Summary  Cellulitis is a skin infection. This condition occurs  most often in the arms and lower legs.  Treatment for this condition may include medicines, such as antibiotic medicines or antihistamines.  Take over-the-counter and prescription medicines only as told by your health care provider. If you were prescribed an antibiotic medicine, do not stop taking the antibiotic even if you start to feel better.  Contact a health care provider if your symptoms do not begin to improve within 1-2 days of starting treatment or your symptoms get worse.  Keep all follow-up visits as told by your health care provider. This is important. These visits let your health care provider make sure that a more serious infection is not developing. This information is not intended to replace advice given to you by your health care provider. Make sure you discuss any questions you have with your health care provider. Document Revised: 06/28/2017 Document Reviewed: 06/28/2017 Elsevier Patient Education  2020 [employer].

## 2020-01-30 NOTE — ED Notes (Addendum)
Date and time results received: 01/30/20 2056   Test: Lactic Acid  Critical Value: 2.2   Name of Provider Notified: Horton, MD   Orders Received? Or Actions Taken?: Awaiting Orders

## 2020-01-30 NOTE — ED Notes (Signed)
CK 11,283 verified by dilution

## 2020-01-30 NOTE — ED Notes (Signed)
Date and time results received: 01/30/20 2132   Test: CK Critical Value:11,283 (verified by dilution)   Name of Provider Notified: Horton, MD   Orders Received? Or Actions Taken?: Awaiting Orders

## 2020-01-30 NOTE — Progress Notes (Signed)
[facility] Patient [facility] [address] [place], [place]  [address] Phone:  [phone]   Fax:  [phone]   New Patient Office Visit  Subjective:  Patient ID: Haley Sandoval, female    DOB: 2000-06-15  Age: 19 y.o. MRN: [id number]  CC:  Chief Complaint  Patient presents with  . Establish Care    Pt. Is here to establish care. Pt. Stated she's having a lot of pain all over her body.     HPI SHENANDOAH YEATS presents to establish care. She  has a past medical history of Seasonal allergies.   She is having 10/10 in all over since 01/09/2020. She admits that she was at work and her left arm felt tight with weakness. She was later hospitalized 01/18/20 for  fever and pain. She was diagnosed with non-traumatic rhabdomyolysis. Her CK 12,199 down from 31,230.  She admits that her pain is generalized however worse in her shoulders. She denies any problems with numbness and tingling.  She is having swelling however this was noted while in the hospital.  She feels like the pain is the worse in her arms or thighs. The is mostly weakness in the shoulders in arms. However she has pain and weakness in her right thigh and leg also.  She has had a rash on her buttock since March 2021. She was unsure what could have started this. She was treated with doxy which was not effective. She did receive a course of anbx in the hospital. However the wound is not healing.  She continues to have the rash but feel like it is getting better but is raw. She denies any itching now . She states that it feels "raw" which is worse with lying down. She does have pain with sitting on hard surfaces. Her mother states that she has a rash on her chest however not the same and feell like this rash is related to recent medications.   She denies any family history of any autoimmune disease. She denies any recreational drug use or any herbals. She denies heart disease in her family . There is thyroid disorders; unknown. She  denies possible spider bite.   Her mother admits that she is not able to bathe herself.   She is on no regular medication but has been on Depo provera . She has not had a menstrual cycle in a few months. She has had some spotting. She plans on discontinuing depo her next injection is in Dec.   Past Medical History:  Diagnosis Date  . Seasonal allergies     Past Surgical History:  Procedure Laterality Date  . WISDOM TOOTH EXTRACTION      Family History  Problem Relation Age of Onset  . Hypertension Mother   . Colon cancer Neg Hx   . Colon polyps Neg Hx   . Liver disease Neg Hx   . Sickle cell anemia Neg Hx     Social History   Socioeconomic History  . Marital status: Single    Spouse name: Not on file  . Number of children: Not on file  . Years of education: Not on file  . Highest education level: Not on file  Occupational History  . Not on file  Tobacco Use  . Smoking status: Never Smoker  . Smokeless tobacco: Never Used  Vaping Use  . Vaping Use: Never used  Substance and Sexual Activity  . Alcohol use: Not Currently  . Drug use: Not  Currently  . Sexual activity: Yes  Other Topics Concern  . Not on file  Social History Narrative  . Not on file   Social Determinants of Health   Financial Resource Strain: Not on file  Food Insecurity: No Food Insecurity  . Worried About Programme researcher, broadcasting/film/video in the Last Year: Never true  . Ran Out of Food in the Last Year: Never true  Transportation Needs: No Transportation Needs  . Lack of Transportation (Medical): No  . Lack of Transportation (Non-Medical): No  Physical Activity: Not on file  Stress: Not on file  Social Connections: Not on file  Intimate Partner Violence: Not on file    ROS Review of Systems  Constitutional: Negative for chills and fever.       Occasional night sweats   Respiratory: Negative for shortness of breath.   Cardiovascular: Negative for chest pain.  Gastrointestinal: Negative for  constipation, diarrhea, nausea and vomiting.  Skin: Positive for rash.    Objective:   Today's Vitals: BP (!) 145/97 (BP Location: Left Arm, Patient Position: Sitting, Cuff Size: Large)   Pulse (!) 124   Temp (!) 97.3 F (36.3 C) (Temporal)   Ht 5\' 5"  (1.651 m)   Wt 297 lb (134.7 kg)   SpO2 95%   BMI 49.42 kg/m   Physical Exam Constitutional:      Appearance: She is obese.  Cardiovascular:     Rate and Rhythm: Tachycardia present.     Pulses: Normal pulses.     Heart sounds: Normal heart sounds.  Pulmonary:     Effort: Pulmonary effort is normal.     Breath sounds: Normal breath sounds.  Abdominal:     Palpations: Abdomen is soft.  Musculoskeletal:        General: Tenderness present.     Comments: Generalized weakness greater in right leg Unable to upload in office photos at this time with amend at a later date   Skin:    Capillary Refill: Capillary refill takes less than 2 seconds.     Findings: Erythema and wound present.     Comments: Appropriate 15.4 cm x 15 cm with center  Scab surrounded by exposed skin which is circled with erythema and warmth  Neurological:     Mental Status: She is alert.     Assessment & Plan:   Problem List Items Addressed This Visit      Musculoskeletal and Integument   Rhabdomyolysis   Relevant Orders   CK    Other Visit Diagnoses    Encounter to establish care    -  Primary   Relevant Orders   HgB A1c (Completed)   POCT URINALYSIS DIP (CLINITEK) (Completed)   Wound of right buttock, subsequent encounter     Long discussion with patient and mother about rhabdomyolysis and causes and how important it is important it is to treat the cause. Encouraged patient to seek ER care for further evaluation however will obtain labs at patients request to reevaluate CK. Discussed possible need for wound I&D and possible sepsis related to CK levels, generalized weakness greater proximal to large nonhealing wound,  tachycardia and elevated BP  .  FMLA papers completed for mom so that she can continue to care for her daughter by providing transportation to apts. She plans on continuing to work but just needs to for the apt days.    Relevant Orders   CBC with Differential/Platelet   Other microscopic hematuria  Relevant Orders   Comp. Metabolic Panel (12)   Generalized edema       Relevant Orders   Brain natriuretic peptide   Elevated blood pressure reading       Tachycardia          No facility-administered encounter medications on file as of 01/30/2020.   Outpatient Encounter Medications as of 01/30/2020  Medication Sig  . methocarbamol (ROBAXIN) 500 MG tablet Take 1 tablet (500 mg total) by mouth every 6 (six) hours as needed for muscle spasms.  Marland Kitchen oxyCODONE (OXY IR/ROXICODONE) 5 MG immediate release tablet Take 1 tablet (5 mg total) by mouth every 4 (four) hours as needed for moderate pain or severe pain.  . predniSONE (DELTASONE) 10 MG tablet Take 6 tablets (60 mg total) by mouth daily for 7 days, THEN 5 tablets (50 mg total) daily for 7 days, THEN 4 tablets (40 mg total) daily for 7 days, THEN 3.5 tablets (35 mg total) daily for 7 days, THEN 3 tablets (30 mg total) daily for 7 days, THEN 2.5 tablets (25 mg total) daily for 7 days, THEN 2 tablets (20 mg total) daily for 7 days, THEN 1.5 tablets (15 mg total) daily for 7 days, THEN 1 tablet (10 mg total) daily for 7 days, THEN 0.5 tablets (5 mg total) daily for 7 days.  . [DISCONTINUED] ibuprofen (ADVIL,MOTRIN) 200 MG tablet Take 200 mg by mouth every 6 (six) hours as needed.    Follow-up: No follow-ups on file.   Barbette Merino, NP

## 2020-01-30 NOTE — ED Triage Notes (Addendum)
Pt believes she has an abscess on her bottom since March. Pt reports being seen by PCP today and sent to ER for further evaluation. Pt reports being admitted recently for rhabdomyolysis and elevated liver enzymes. Pt reports that her body is in some much pain that she can barely take care of herself.

## 2020-01-30 NOTE — ED Provider Notes (Signed)
[facility]-EMERGENCY DEPT Provider Note   CSN: [id number] Arrival date & time: 01/30/20  1748     History Chief Complaint  Patient presents with  . Abscess    Haley Sandoval is a 19 y.o. female.  HPI   19 year old female with medical history of obesity presents the emergency department concern for right buttocks abscess and general unwell feeling.  Patient states a couple months ago she presented to an urgent care with body aches and general malaise.  She was found to be tachycardic and febrile and referred to any [facility] emergency room.  There she was found to be in rhabdomyolysis, admitted for IV and symptomatic therapy.  She claims they did not find the etiology of her rhabdomyolysis but since being discharged she has not significantly improved.  Preceding this patient noticed that she had what she thought was a bug bite to her right buttocks area.  The last couple months this area has become red, enlarged with central scabbing.  Is never been open or draining but it has become painful and warm.  She was on a short course of antibiotics around the time that she was admitted but no recent antibiotic use.  The abscess has never been fully evaluated or had I&D done.  She is had no recent fever but endorses chills, fatigue, myalgias.  She has no history of skin infections, MRSA infection or abscesses  Past Medical History:  Diagnosis Date  . Seasonal allergies     Patient Active Problem List   Diagnosis Date Noted  . Atypical pigmented skin lesion 01/29/2020  . Non-intractable vomiting   . Constipation   . Hepatitis   . Rhabdomyolysis 01/18/2020  . Elevated liver enzymes 01/18/2020  . Acute febrile illness 01/18/2020  . Hypoalbuminemia 01/18/2020  . Hyponatremia 01/18/2020  . Obesity, Class III, BMI 40-49.9 (morbid obesity) (HCC) 01/18/2020    Past Surgical History:  Procedure Laterality Date  . WISDOM TOOTH EXTRACTION       OB History   No obstetric  history on file.     Family History  Problem Relation Age of Onset  . Hypertension Mother   . Colon cancer Neg Hx   . Colon polyps Neg Hx   . Liver disease Neg Hx   . Sickle cell anemia Neg Hx     Social History   Tobacco Use  . Smoking status: Never Smoker  . Smokeless tobacco: Never Used  Vaping Use  . Vaping Use: Never used  Substance Use Topics  . Alcohol use: Not Currently  . Drug use: Not Currently    Home Medications Prior to Admission medications   Medication Sig Start Date End Date Taking? Authorizing Provider  methocarbamol (ROBAXIN) 500 MG tablet Take 1 tablet (500 mg total) by mouth every 6 (six) hours as needed for muscle spasms. 01/22/20   Sherryll Burger, Pratik D, DO  oxyCODONE (OXY IR/ROXICODONE) 5 MG immediate release tablet Take 1 tablet (5 mg total) by mouth every 4 (four) hours as needed for moderate pain or severe pain. 01/22/20   Sherryll Burger, Pratik D, DO  predniSONE (DELTASONE) 10 MG tablet Take 6 tablets (60 mg total) by mouth daily for 7 days, THEN 5 tablets (50 mg total) daily for 7 days, THEN 4 tablets (40 mg total) daily for 7 days, THEN 3.5 tablets (35 mg total) daily for 7 days, THEN 3 tablets (30 mg total) daily for 7 days, THEN 2.5 tablets (25 mg total) daily for 7 days, THEN  2 tablets (20 mg total) daily for 7 days, THEN 1.5 tablets (15 mg total) daily for 7 days, THEN 1 tablet (10 mg total) daily for 7 days, THEN 0.5 tablets (5 mg total) daily for 7 days. 01/22/20 04/01/20  Maurilio LovelyShah, Pratik D, DO    Allergies    Patient has no known allergies.  Review of Systems   Review of Systems  Constitutional: Positive for chills, fatigue and fever.  HENT: Negative for congestion.   Eyes: Negative for visual disturbance.  Respiratory: Negative for shortness of breath.   Cardiovascular: Negative for chest pain.  Gastrointestinal: Negative for abdominal pain, diarrhea and vomiting.  Genitourinary: Negative for dysuria.  Musculoskeletal: Positive for arthralgias and myalgias.   Skin: Positive for wound. Negative for rash.  Neurological: Negative for headaches.    Physical Exam Updated Vital Signs BP (!) 159/131   Pulse (!) 114   Temp 99.3 F (37.4 C) (Oral)   Resp 15   SpO2 100%   Physical Exam Vitals and nursing note reviewed.  Constitutional:      Appearance: She is obese.  HENT:     Head: Normocephalic.     Mouth/Throat:     Mouth: Mucous membranes are moist.  Cardiovascular:     Rate and Rhythm: Normal rate.  Pulmonary:     Effort: Pulmonary effort is normal. No respiratory distress.  Abdominal:     Palpations: Abdomen is soft.     Tenderness: There is no abdominal tenderness.  Skin:    Comments: Approximate 8 x 8 cm area on the right lateral buttocks which is erythematous, slightly warm to touch with central scab-like skin changes.  There is a small indurated area at the center that is tender to palpation.  The rest of the buttocks is soft, does not appear to be any tracking deeper into the muscle, does not approach midline.  Appears superficial.  Neurological:     Mental Status: She is alert and oriented to person, place, and time. Mental status is at baseline.  Psychiatric:        Mood and Affect: Mood normal.     ED Results / Procedures / Treatments   Labs (all labs ordered are listed, but only abnormal results are displayed) Labs Reviewed  LACTIC ACID, PLASMA - Abnormal; Notable for the following components:      Result Value   Lactic Acid, Venous 2.2 (*)    All other components within normal limits  COMPREHENSIVE METABOLIC PANEL - Abnormal; Notable for the following components:   Sodium 133 (*)    Glucose, Bld 118 (*)    Calcium 8.5 (*)    Total Protein 6.3 (*)    Albumin 2.9 (*)    AST 270 (*)    ALT 78 (*)    All other components within normal limits  CBC WITH DIFFERENTIAL/PLATELET - Abnormal; Notable for the following components:   WBC 20.4 (*)    Neutro Abs 15.9 (*)    Monocytes Absolute 1.8 (*)    Abs Immature  Granulocytes 0.77 (*)    All other components within normal limits  URINALYSIS, ROUTINE W REFLEX MICROSCOPIC - Abnormal; Notable for the following components:   APPearance HAZY (*)    Hgb urine dipstick MODERATE (*)    Protein, ur 30 (*)    Bacteria, UA RARE (*)    All other components within normal limits  CULTURE, BLOOD (SINGLE)  URINE CULTURE  PROTIME-INR  APTT  LACTIC ACID, PLASMA  CK  I-STAT BETA HCG BLOOD, ED ([facility], [facility], [facility] ONLY)    EKG EKG Interpretation  Date/Time:  Friday January 30 2020 19:37:26 EST Ventricular Rate:  111 PR Interval:    QRS Duration: 77 QT Interval:  321 QTC Calculation: 437 R Axis:   80 Text Interpretation: Sinus tachycardia Borderline Q waves in inferior leads Sinus tachycardia, Inferior lead changes present prior Confirmed by Coralee Pesa [phone]) on 01/30/2020 8:10:00 PM   Radiology No results found.  Procedures .Critical Care Performed by: Rozelle Logan, DO Authorized by: Rozelle Logan, DO   Critical care provider statement:    Critical care time (minutes):  90   Critical care was necessary to treat or prevent imminent or life-threatening deterioration of the following conditions:  Dehydration and sepsis   Critical care was time spent personally by me on the following activities:  Discussions with consultants, examination of patient, ordering and performing treatments and interventions, ordering and review of laboratory studies and development of treatment plan with patient or surrogate   I assumed direction of critical care for this patient from another provider in my specialty: no   .Marland KitchenIncision and Drainage  Date/Time: 01/30/2020 11:40 PM Performed by: Rozelle Logan, DO Authorized by: Rozelle Logan, DO   Consent:    Consent obtained:  Verbal   Consent given by:  Patient   Risks, benefits, and alternatives were discussed: yes     Risks discussed:  Bleeding, incomplete drainage, pain, infection and damage to other  organs   Alternatives discussed:  No treatment Universal protocol:    Patient identity confirmed:  Verbally with patient Location:    Type:  Abscess   Location: Right lateral Buttocks. Pre-procedure details:    Skin preparation:  Chlorhexidine Sedation:    Sedation type:  None Anesthesia:    Anesthesia method:  Local infiltration   Local anesthetic:  Lidocaine 2% WITH epi Procedure type:    Complexity:  Simple Procedure details:    Ultrasound guidance: yes     Incision types:  Stab incision   Incision depth:  Subcutaneous   Wound management:  Probed and deloculated, irrigated with saline and extensive cleaning   Drainage:  Purulent and serosanguinous   Drainage amount:  Moderate   Wound treatment:  Wound left open   Packing materials:  1/4 in gauze   Amount 1/4":  5 in Post-procedure details:    Procedure completion:  Tolerated   (including critical care time)  Medications Ordered in ED Medications  sodium chloride 0.9 % bolus 1,000 mL (1,000 mLs Intravenous New Bag/Given 01/30/20 2046)    ED Course  I have reviewed the triage vital signs and the nursing notes.  Pertinent labs & imaging results that were available during my care of the patient were reviewed by me and considered in my medical decision making (see chart for details).  Clinical Course as of 01/30/20 2318  Fri Jan 30, 2020  2114 Blood work shows a leukocytosis of twenty with a left shift.  Lactic is 2.2.  He has a transaminitis with AST is higher than ALT, CK continues to be elevated.  Urinalysis shows no signs of infection. [KH]  2300 Performed incision and drainage, got serosanguineous and pustular drainage, wound culture taken.  After a liter of fluids patient continues to be tachycardic, blood pressure is 95/80.  Antibiotics will be ordered and patient will be admitted. [KH]    Clinical Course User Index [KH] Elisha Cooksey, Clabe Seal, DO   MDM Rules/Calculators/A&P  19 year old  presents the emergency department with possible sepsis from a wound/abscess on the right buttocks.  She is tachycardic on arrival.  Complaining of fatigue and malaise.  Will evaluate for infection/sepsis and ultrasound the buttocks area for possible I&D.  Ultrasound identifies a very small pocket in the center of the wound where the induration was palpated.  I&D was performed, serosanguineous and some pustular fluid was drained, wound culture was taken.  Patient is septic from buttocks wound.  Antibiotics have been ordered, she will be admitted for further evaluation and treatment. Final Clinical Impression(s) / ED Diagnoses Final diagnoses:  None    Rx / DC Orders ED Discharge Orders    None       Rozelle Logan, DO 01/30/20 2342

## 2020-01-31 ENCOUNTER — Inpatient Hospital Stay (HOSPITAL_COMMUNITY): Payer: Medicaid Other

## 2020-01-31 ENCOUNTER — Encounter (HOSPITAL_COMMUNITY): Payer: Self-pay | Admitting: Internal Medicine

## 2020-01-31 ENCOUNTER — Telehealth: Payer: Self-pay | Admitting: Family Medicine

## 2020-01-31 DIAGNOSIS — R748 Abnormal levels of other serum enzymes: Secondary | ICD-10-CM | POA: Diagnosis not present

## 2020-01-31 DIAGNOSIS — Z20822 Contact with and (suspected) exposure to covid-19: Secondary | ICD-10-CM | POA: Diagnosis not present

## 2020-01-31 DIAGNOSIS — Z6841 Body Mass Index (BMI) 40.0 and over, adult: Secondary | ICD-10-CM | POA: Diagnosis not present

## 2020-01-31 DIAGNOSIS — A419 Sepsis, unspecified organism: Secondary | ICD-10-CM

## 2020-01-31 DIAGNOSIS — L0231 Cutaneous abscess of buttock: Secondary | ICD-10-CM | POA: Diagnosis not present

## 2020-01-31 DIAGNOSIS — M60859 Other myositis, unspecified thigh: Secondary | ICD-10-CM

## 2020-01-31 DIAGNOSIS — J302 Other seasonal allergic rhinitis: Secondary | ICD-10-CM | POA: Diagnosis not present

## 2020-01-31 DIAGNOSIS — L0291 Cutaneous abscess, unspecified: Secondary | ICD-10-CM | POA: Diagnosis not present

## 2020-01-31 DIAGNOSIS — Z79899 Other long term (current) drug therapy: Secondary | ICD-10-CM | POA: Diagnosis not present

## 2020-01-31 DIAGNOSIS — K6289 Other specified diseases of anus and rectum: Secondary | ICD-10-CM | POA: Diagnosis not present

## 2020-01-31 DIAGNOSIS — R21 Rash and other nonspecific skin eruption: Secondary | ICD-10-CM | POA: Diagnosis not present

## 2020-01-31 DIAGNOSIS — N730 Acute parametritis and pelvic cellulitis: Secondary | ICD-10-CM | POA: Diagnosis not present

## 2020-01-31 DIAGNOSIS — L03317 Cellulitis of buttock: Secondary | ICD-10-CM

## 2020-01-31 DIAGNOSIS — M6282 Rhabdomyolysis: Secondary | ICD-10-CM

## 2020-01-31 DIAGNOSIS — K612 Anorectal abscess: Secondary | ICD-10-CM | POA: Diagnosis not present

## 2020-01-31 DIAGNOSIS — N644 Mastodynia: Secondary | ICD-10-CM | POA: Diagnosis not present

## 2020-01-31 DIAGNOSIS — Z8249 Family history of ischemic heart disease and other diseases of the circulatory system: Secondary | ICD-10-CM | POA: Diagnosis not present

## 2020-01-31 DIAGNOSIS — E8770 Fluid overload, unspecified: Secondary | ICD-10-CM | POA: Diagnosis not present

## 2020-01-31 DIAGNOSIS — D649 Anemia, unspecified: Secondary | ICD-10-CM | POA: Diagnosis not present

## 2020-01-31 DIAGNOSIS — R6520 Severe sepsis without septic shock: Secondary | ICD-10-CM | POA: Diagnosis not present

## 2020-01-31 DIAGNOSIS — M7989 Other specified soft tissue disorders: Secondary | ICD-10-CM | POA: Diagnosis not present

## 2020-01-31 DIAGNOSIS — R7989 Other specified abnormal findings of blood chemistry: Secondary | ICD-10-CM | POA: Diagnosis not present

## 2020-01-31 DIAGNOSIS — I10 Essential (primary) hypertension: Secondary | ICD-10-CM | POA: Diagnosis not present

## 2020-01-31 DIAGNOSIS — M609 Myositis, unspecified: Secondary | ICD-10-CM | POA: Diagnosis not present

## 2020-01-31 DIAGNOSIS — N3289 Other specified disorders of bladder: Secondary | ICD-10-CM | POA: Diagnosis not present

## 2020-01-31 DIAGNOSIS — K611 Rectal abscess: Secondary | ICD-10-CM | POA: Diagnosis not present

## 2020-01-31 DIAGNOSIS — R188 Other ascites: Secondary | ICD-10-CM | POA: Diagnosis not present

## 2020-01-31 DIAGNOSIS — S31819A Unspecified open wound of right buttock, initial encounter: Secondary | ICD-10-CM | POA: Diagnosis not present

## 2020-01-31 DIAGNOSIS — R109 Unspecified abdominal pain: Secondary | ICD-10-CM | POA: Diagnosis not present

## 2020-01-31 DIAGNOSIS — E876 Hypokalemia: Secondary | ICD-10-CM | POA: Diagnosis not present

## 2020-01-31 DIAGNOSIS — R7401 Elevation of levels of liver transaminase levels: Secondary | ICD-10-CM | POA: Diagnosis not present

## 2020-01-31 DIAGNOSIS — N739 Female pelvic inflammatory disease, unspecified: Secondary | ICD-10-CM | POA: Diagnosis not present

## 2020-01-31 DIAGNOSIS — R000 Tachycardia, unspecified: Secondary | ICD-10-CM | POA: Diagnosis not present

## 2020-01-31 LAB — COMPREHENSIVE METABOLIC PANEL
ALT: 70 U/L — ABNORMAL HIGH (ref 0–44)
AST: 247 U/L — ABNORMAL HIGH (ref 15–41)
Albumin: 2.6 g/dL — ABNORMAL LOW (ref 3.5–5.0)
Alkaline Phosphatase: 36 U/L — ABNORMAL LOW (ref 38–126)
Anion gap: 10 (ref 5–15)
BUN: 13 mg/dL (ref 6–20)
CO2: 22 mmol/L (ref 22–32)
Calcium: 8.1 mg/dL — ABNORMAL LOW (ref 8.9–10.3)
Chloride: 102 mmol/L (ref 98–111)
Creatinine, Ser: 0.54 mg/dL (ref 0.44–1.00)
GFR, Estimated: >60 mL/min (ref >60)
Glucose, Bld: 98 mg/dL (ref 70–99)
Potassium: 4.1 mmol/L (ref 3.5–5.1)
Sodium: 134 mmol/L — ABNORMAL LOW (ref 135–145)
Total Bilirubin: 0.4 mg/dL (ref 0.3–1.2)
Total Protein: 5.5 g/dL — ABNORMAL LOW (ref 6.5–8.1)

## 2020-01-31 LAB — COMP. METABOLIC PANEL (12)
AST: 301 IU/L — ABNORMAL HIGH (ref 0–40)
Albumin/Globulin Ratio: 1.2 (ref 1.2–2.2)
Albumin: 3.3 g/dL — ABNORMAL LOW (ref 3.9–5.0)
Alkaline Phosphatase: 53 IU/L (ref 42–106)
BUN/Creatinine Ratio: 19 (ref 9–23)
BUN: 11 mg/dL (ref 6–20)
Bilirubin Total: 0.3 mg/dL (ref 0.0–1.2)
Calcium: 8.8 mg/dL (ref 8.7–10.2)
Chloride: 100 mmol/L (ref 96–106)
Creatinine, Ser: 0.57 mg/dL (ref 0.57–1.00)
GFR calc Af Amer: 154 mL/min/{1.73_m2} (ref >59)
GFR calc non Af Amer: 134 mL/min/{1.73_m2} (ref >59)
Globulin, Total: 2.7 g/dL (ref 1.5–4.5)
Glucose: 85 mg/dL (ref 65–99)
Potassium: 4.5 mmol/L (ref 3.5–5.2)
Sodium: 135 mmol/L (ref 134–144)
Total Protein: 6 g/dL (ref 6.0–8.5)

## 2020-01-31 LAB — CBC WITH DIFFERENTIAL/PLATELET
Abs Immature Granulocytes: 0.64 10*3/uL — ABNORMAL HIGH (ref 0.00–0.07)
Basophils Absolute: 0.1 10*3/uL (ref 0.0–0.1)
Basophils Relative: 1 %
Eosinophils Absolute: 0.1 10*3/uL (ref 0.0–0.5)
Eosinophils Relative: 1 %
HCT: 36.6 % (ref 36.0–46.0)
Hemoglobin: 11.7 g/dL — ABNORMAL LOW (ref 12.0–15.0)
Immature Granulocytes: 4 %
Lymphocytes Relative: 14 %
Lymphs Abs: 2.3 10*3/uL (ref 0.7–4.0)
MCH: 28.5 pg (ref 26.0–34.0)
MCHC: 32 g/dL (ref 30.0–36.0)
MCV: 89.1 fL (ref 80.0–100.0)
Monocytes Absolute: 1.7 10*3/uL — ABNORMAL HIGH (ref 0.1–1.0)
Monocytes Relative: 11 %
Neutro Abs: 11.3 10*3/uL — ABNORMAL HIGH (ref 1.7–7.7)
Neutrophils Relative %: 69 %
Platelets: 313 10*3/uL (ref 150–400)
RBC: 4.11 MIL/uL (ref 3.87–5.11)
RDW: 15.6 % — ABNORMAL HIGH (ref 11.5–15.5)
WBC: 16.2 10*3/uL — ABNORMAL HIGH (ref 4.0–10.5)
nRBC: 0 % (ref 0.0–0.2)

## 2020-01-31 LAB — CK
Total CK: 13116 U/L (ref 32–182)
Total CK: 10484 U/L — ABNORMAL HIGH (ref 38–234)

## 2020-01-31 LAB — RESP PANEL BY RT-PCR (FLU A&B, COVID) ARPGX2
Influenza A by PCR: NEGATIVE
Influenza B by PCR: NEGATIVE
SARS Coronavirus 2 by RT PCR: NEGATIVE

## 2020-01-31 LAB — C-REACTIVE PROTEIN: CRP: 5.8 mg/dL — ABNORMAL HIGH (ref <1.0)

## 2020-01-31 LAB — PROCALCITONIN
Procalcitonin: <0.1 ng/mL
Procalcitonin: <0.1 ng/mL

## 2020-01-31 LAB — LACTIC ACID, PLASMA
Lactic Acid, Venous: 1.3 mmol/L (ref 0.5–1.9)
Lactic Acid, Venous: 1.3 mmol/L (ref 0.5–1.9)

## 2020-01-31 LAB — PHOSPHORUS: Phosphorus: 4.1 mg/dL (ref 2.5–4.6)

## 2020-01-31 LAB — BRAIN NATRIURETIC PEPTIDE

## 2020-01-31 LAB — SEDIMENTATION RATE: Sed Rate: 37 mm/hr — ABNORMAL HIGH (ref 0–22)

## 2020-01-31 MED ORDER — LACTATED RINGERS IV SOLN: INTRAVENOUS | Status: AC

## 2020-01-31 MED ORDER — SODIUM CHLORIDE 0.9 % IV SOLN
2.0000 g | Freq: Three times a day (TID) | INTRAVENOUS | Status: DC
  Administered 2020-01-31 – 2020-02-02 (×7): 2 g via INTRAVENOUS
  Filled 2020-01-31 (×3): qty 2
  Filled 2020-01-31: qty 0.07
  Filled 2020-01-31 (×3): qty 2

## 2020-01-31 MED ORDER — SODIUM CHLORIDE 0.9 % IV SOLN
2.0000 g | Freq: Once | INTRAVENOUS | Status: AC
  Administered 2020-01-31: 02:00:00 2 g via INTRAVENOUS
  Filled 2020-01-31: qty 2

## 2020-01-31 MED ORDER — MORPHINE SULFATE (PF) 2 MG/ML IV SOLN
2.0000 mg | INTRAVENOUS | Status: DC | PRN
  Administered 2020-01-31 – 2020-02-01 (×5): 4 mg via INTRAVENOUS
  Filled 2020-01-31 (×5): qty 2

## 2020-01-31 MED ORDER — DIPHENHYDRAMINE HCL 25 MG PO CAPS
25.0000 mg | ORAL_CAPSULE | Freq: Four times a day (QID) | ORAL | Status: DC | PRN
  Administered 2020-01-31 – 2020-02-05 (×5): 25 mg via ORAL
  Filled 2020-01-31 (×5): qty 1

## 2020-01-31 MED ORDER — ACETAMINOPHEN 325 MG PO TABS
650.0000 mg | ORAL_TABLET | Freq: Four times a day (QID) | ORAL | Status: DC | PRN
  Administered 2020-01-31 – 2020-02-16 (×11): 650 mg via ORAL
  Filled 2020-01-31 (×11): qty 2

## 2020-01-31 MED ORDER — VANCOMYCIN HCL 1250 MG/250ML IV SOLN
1250.0000 mg | Freq: Three times a day (TID) | INTRAVENOUS | Status: DC
  Filled 2020-01-31: qty 250

## 2020-01-31 MED ORDER — VANCOMYCIN HCL 1500 MG/300ML IV SOLN
1500.0000 mg | Freq: Two times a day (BID) | INTRAVENOUS | Status: DC
  Administered 2020-01-31 – 2020-02-02 (×5): 1500 mg via INTRAVENOUS
  Filled 2020-01-31 (×5): qty 300

## 2020-01-31 MED ORDER — ACETAMINOPHEN 650 MG RE SUPP: 650.0000 mg | Freq: Four times a day (QID) | RECTAL | Status: DC | PRN

## 2020-01-31 MED ORDER — IOHEXOL 300 MG/ML  SOLN
100.0000 mL | Freq: Once | INTRAMUSCULAR | Status: AC | PRN
  Administered 2020-01-31: 03:00:00 100 mL via INTRAVENOUS

## 2020-01-31 MED ORDER — MORPHINE SULFATE (PF) 2 MG/ML IV SOLN
2.0000 mg | INTRAVENOUS | Status: DC | PRN
  Administered 2020-01-31 (×3): 2 mg via INTRAVENOUS
  Filled 2020-01-31 (×3): qty 1

## 2020-01-31 MED ORDER — VANCOMYCIN HCL 2000 MG/400ML IV SOLN
2000.0000 mg | Freq: Once | INTRAVENOUS | Status: AC
  Administered 2020-01-31: 03:00:00 2000 mg via INTRAVENOUS
  Filled 2020-01-31: qty 400

## 2020-01-31 MED ORDER — SODIUM CHLORIDE (PF) 0.9 % IJ SOLN
INTRAMUSCULAR | Status: AC
  Filled 2020-01-31: qty 50

## 2020-01-31 NOTE — Consult Note (Addendum)
Gynecology Consult Note  Date of Consult: 01/31/2020   Requesting Provider: [facility]  Primary OBGYN: [facility] Primary Care Provider: Barbette Merino  Reason for Consult: inflammatory changes in the pelvis seen on CT  History of Present Illness: Haley Sandoval is a 19 y.o. G1P0010 (LMP: none due to depo provera), with the above CC.    CT Pelvis ordered o/n by primary team to due right buttock lesion/infection and it showed improved changes vs her last admission a week and half ago. Today it showed "persistent diffuse inflammatory changes in the pelvis with a small amount of free fluid.  This is similar but slightly improved from prior study."  Patient previously admitted 11/28-12/2 after being sent to the ED from urgent care due to her right buttock, generalized aches and CK of 31k. She was how IV rocephin, doxycycline and flagyl for presumed PID; no vaginal swabs were obtained. She states she did not take the abx when she was discharged from the hospital. She was seen by GI at that time due to elevated LFTs and etiology thought to be due to rhabdo with outpatient f/u to come.   She was seen by her PCP yesterday who recommended going back to the ED due to abnormal vital signs and patient not feeling any better. In the ED yesterday she had an I&D, debridement and cultures obtained from the right buttock lesion and pt admitted to hospitalist service. CK still elevated at 13k.   Patient states right buttock lesion first noticed in march and has progressively gotten worse. No h/o prior episodes or issues. Pt has only received her depo provera injections in the deltoid per her report and the chart confirms.   She denies any new recent medications and denies h/o flu or covid shot.  Patient has no vaginal bleeding, discharge, dysuria. She can't remember the last time she was sexually active.   ROS: A 12-point review of systems was performed and negative,  except as stated in the above HPI.  OBGYN History: As per HPI. History of pap smears: none (too young) History of STIs: No    Past Medical History: Past Medical History:  Diagnosis Date  . Seasonal allergies     Past Surgical History: Past Surgical History:  Procedure Laterality Date  . WISDOM TOOTH EXTRACTION      Family History:  Family History  Problem Relation Age of Onset  . Hypertension Mother   . Colon cancer Neg Hx   . Colon polyps Neg Hx   . Liver disease Neg Hx   . Sickle cell anemia Neg Hx     Social History:  Social History   Socioeconomic History  . Marital status: Single    Spouse name: Not on file  . Number of children: Not on file  . Years of education: Not on file  . Highest education level: Not on file  Occupational History  . Not on file  Tobacco Use  . Smoking status: Never Smoker  . Smokeless tobacco: Never Used  Vaping Use  . Vaping Use: Never used  Substance and Sexual Activity  . Alcohol use: Not Currently  . Drug use: Not Currently  . Sexual activity: Yes  Other Topics Concern  . Not on file  Social History Narrative  . Not on file   Social Determinants of Health   Financial Resource Strain: Not on file  Food Insecurity: No Food Insecurity  . Worried About Programme researcher, broadcasting/film/video in  the Last Year: Never true  . Ran Out of Food in the Last Year: Never true  Transportation Needs: No Transportation Needs  . Lack of Transportation (Medical): No  . Lack of Transportation (Non-Medical): No  Physical Activity: Not on file  Stress: Not on file  Social Connections: Not on file  Intimate Partner Violence: Not on file    Allergy: No Known Allergies  Current Outpatient Medications: Medications Prior to Admission  Medication Sig Dispense Refill Last Dose  . methocarbamol (ROBAXIN) 500 MG tablet Take 1 tablet (500 mg total) by mouth every 6 (six) hours as needed for muscle spasms. 30 tablet 0   . oxyCODONE (OXY IR/ROXICODONE) 5 MG  immediate release tablet Take 1 tablet (5 mg total) by mouth every 4 (four) hours as needed for moderate pain or severe pain. 20 tablet 0   . predniSONE (DELTASONE) 10 MG tablet Take 6 tablets (60 mg total) by mouth daily for 7 days, THEN 5 tablets (50 mg total) daily for 7 days, THEN 4 tablets (40 mg total) daily for 7 days, THEN 3.5 tablets (35 mg total) daily for 7 days, THEN 3 tablets (30 mg total) daily for 7 days, THEN 2.5 tablets (25 mg total) daily for 7 days, THEN 2 tablets (20 mg total) daily for 7 days, THEN 1.5 tablets (15 mg total) daily for 7 days, THEN 1 tablet (10 mg total) daily for 7 days, THEN 0.5 tablets (5 mg total) daily for 7 days. 203 tablet 0      Hospital Medications: Current Facility-Administered Medications  Medication Dose Route Frequency Provider Last Rate Last Admin  . acetaminophen (TYLENOL) tablet 650 mg  650 mg Oral Q6H PRN Eduard ClosKakrakandy, Arshad N, MD       Or  . acetaminophen (TYLENOL) suppository 650 mg  650 mg Rectal Q6H PRN Eduard ClosKakrakandy, Arshad N, MD      . ceFEPIme (MAXIPIME) 2 g in sodium chloride 0.9 % 100 mL IVPB  2 g Intravenous Q8H Green, Terri L, RPH      . lactated ringers infusion   Intravenous Continuous Eduard ClosKakrakandy, Arshad N, MD 150 mL/hr at 01/31/20 0106 New Bag at 01/31/20 0106  . morphine 2 MG/ML injection 2 mg  2 mg Intravenous Q3H PRN Eduard ClosKakrakandy, Arshad N, MD   2 mg at 01/31/20 0525  . sodium chloride (PF) 0.9 % injection           . vancomycin (VANCOREADY) IVPB 1250 mg/250 mL  1,250 mg Intravenous Q8H Otho BellowsGreen, Terri L, RPH         Physical Exam:   Current Vital Signs 24h Vital Sign Ranges  T 98.3 F (36.8 C) Temp  Avg: 98.5 F (36.9 C)  Min: 97.3 F (36.3 C)  Max: 99.3 F (37.4 C)  BP (!) 148/90 BP  Min: 94/54  Max: 159/131  HR 100 Pulse  Avg: 112.5  Min: 100  Max: 127  RR 16 Resp  Avg: 19.6  Min: 15  Max: 26  SaO2 99 % Room Air SpO2  Avg: 99.1 %  Min: 94 %  Max: 100 %       24 Hour I/O Current Shift I/O  Time Ins Outs 12/10 0701 -  12/11 0700 In: 1960.2 [P.O.:240; I.V.:170.2] Out: 200 [Urine:200] No intake/output data recorded.   Patient Vitals for the past 24 hrs:  BP Temp Temp src Pulse Resp SpO2 Height Weight  01/31/20 0459 (!) 148/90 98.3 F (36.8 C) Oral 100 16 99 % -- --  01/31/20 0032 (!) 147/82 98.7 F (37.1 C) Oral (!) 108 17 98 % -- --  01/31/20 0017 -- 98.9 F (37.2 C) Oral -- -- -- -- --  01/30/20 2345 111/70 -- -- (!) 103 16 100 % -- --  01/30/20 2330 104/61 -- -- (!) 112 (!) 24 99 % -- --  01/30/20 2315 101/62 -- -- (!) 110 20 100 % -- --  01/30/20 2300 103/64 -- -- (!) 102 15 99 % -- --  01/30/20 2230 -- -- -- (!) 111 (!) 22 100 % -- --  01/30/20 2215 95/80 -- -- (!) 112 (!) 23 100 % -- --  01/30/20 2200 106/64 -- -- (!) 111 (!) 21 100 % -- --  01/30/20 2145 125/76 -- -- (!) 109 17 100 % -- --  01/30/20 2144 -- -- -- -- -- -- 5\' 5"  (1.651 m) 134.7 kg  01/30/20 2130 (!) 103/59 -- -- (!) 114 (!) 26 100 % -- --  01/30/20 2115 (!) 94/54 -- -- (!) 106 (!) 23 100 % -- --  01/30/20 1945 -- -- -- (!) 114 (!) 26 100 % -- --  01/30/20 1930 -- -- -- (!) 119 -- 100 % -- --  01/30/20 1915 104/62 -- -- (!) 114 19 100 % -- --  01/30/20 1900 (!) 159/131 -- -- (!) 116 15 100 % -- --  01/30/20 1800 (!) 145/113 -- -- (!) 125 16 94 % -- --  01/30/20 1759 (!) 145/113 99.3 F (37.4 C) Oral (!) 127 18 99 % -- --    Body mass index is 49.42 kg/m. General appearance: Well nourished, well developed female in no acute distress.  Respiratory:  Normal respiratory effort Abdomen:obese, soft, nttp, nd.  Neuro/Psych:  Normal mood and affect.  Skin:  Warm and dry.  Extremities: no inguinal or thigh pain  Laboratory: Beta HCG: negative  Recent Labs  Lab 01/30/20 1538 01/30/20 2011 01/31/20 0329  WBC CANCELED 20.4* 16.2*  HGB CANCELED 13.1 11.7*  HCT CANCELED 39.9 36.6  PLT CANCELED 367 313   Recent Labs  Lab 01/30/20 1538 01/30/20 2011 01/31/20 0329  NA 135 133* 134*  K 4.5 4.0 4.1  CL 100 100 102   CO2  --  23 22  BUN 11 14 13   CREATININE 0.57 0.54 0.54  CALCIUM 8.8 8.5* 8.1*  PROT 6.0 6.3* 5.5*  BILITOT 0.3 0.4 0.4  ALKPHOS 53 53 36*  ALT  --  78* 70*  AST 301* 270* 247*  GLUCOSE 85 118* 98   Recent Labs  Lab 01/30/20 2011  APTT 28  INR 1.0   No results for input(s): ABORH in the last 168 hours.  Imaging:  Narrative & Impression  CLINICAL DATA:  Anorectal abscess.  EXAM: CT PELVIS WITH CONTRAST  TECHNIQUE: Multidetector CT imaging of the pelvis was performed using the standard protocol following the bolus administration of intravenous contrast.  CONTRAST:  OMNIPAQUE IOHEXOL 300 MG/ML  SOLN  COMPARISON:  January 18, 2020  FINDINGS: Urinary Tract: There is circumferential wall thickening of the of the urinary bladder.  Bowel: The visualized bowel is unremarkable. The appendix is unremarkable.  Vascular/Lymphatic: There is no acute vascular abnormality detected. There are mildly enlarged bilateral inguinal lymph nodes, right worse than left.  Reproductive:  No mass or other significant abnormality  Other: There are persistent diffuse inflammatory changes in the pelvis with a small amount of free fluid. This is similar but slightly improved from prior study.  Musculoskeletal: There is diffuse intracompartmental edema within the proximal thighs. There may be some edema involving some of the musculature of the proximal thighs bilaterally. This appearance is near symmetric. There is a defect in the posterior subcutaneous soft tissues overlying the right gluteal muscle. In this region, there is fat induration without evidence for an abscess. There is nonspecific edema along the patient's flanks.  IMPRESSION: 1. There is a defect in the posterior subcutaneous soft tissues overlying the right gluteal muscle. In this region, there is fat induration without evidence for an abscess. 2. There are persistent diffuse inflammatory changes in  the pelvis with a small amount of free fluid. This is similar but slightly improved from prior study. 3. Circumferential wall thickening of the urinary bladder. Correlation with urinalysis is recommended to exclude cystitis. 4. Apparent intracompartmental edema involving the proximal thighs bilaterally with possible intramuscular edema bilaterally is nonspecific but can be seen in patients with rhabdomyolysis. Correlation with laboratory studies, specifically CK, is recommended.   Electronically Signed   By: Katherine Mantle M.D.   On: 01/31/2020 03:27     Assessment: Ms. Catala is a 19 y.o. with unexplained inflammatory changes in the pelvis on CT. Pt stable.   Plan: I was called by hospitalist service overnight and I recommended obtaining gonorrhea, chlamydia swabs and getting a transvag u/s.   We will follow up swabs and u/s but I told the patient I'm not sure the etiology of the findings on CT but I told her I doubt she has a GYN source for the findings. Ucx sent yesterday and in process; none sent at her prior hospitalization.   Consider getting general surgery consult.   Total time taking care of the patient was 40 minutes, with greater than 50% of the time spent in face to face interaction with the patient.  Haley Copa MD Attending Center for [facility] (Faculty [facility]) GYN Consult Phone: [phone] (M-F, 0800-1700) & [phone] (Off hours, weekends, holidays)

## 2020-01-31 NOTE — Plan of Care (Signed)
  Problem: Coping: Goal: Level of anxiety will decrease Outcome: Progressing   Problem: Activity: Goal: Risk for activity intolerance will decrease Outcome: Progressing   Problem: Pain Managment: Goal: General experience of comfort will improve Outcome: Progressing   Problem: Skin Integrity: Goal: Risk for impaired skin integrity will decrease Outcome: Progressing

## 2020-01-31 NOTE — Progress Notes (Signed)
Pharmacy Antibiotic Note  Haley Sandoval is a 19 y.o. female admitted on 01/30/2020 with buttock abscess and rhabdomyolysis. Pharmacy has been consulted for Vancomycin & Cefepime dosing.  Today, 01/31/2020:  WBC improved  Remains afebrile  CK trending down  SCr low   Plan:  Continue cefepime 2g IV q8 hr as ordered; will follow peripherally and adjust per standing abx renal protocol  Adjust vancomycin to 1500 mg IV q12 hr based on new SCr  SCr q48 hr while on vanc  Height: 5\' 5"  (165.1 cm) Weight: 134.7 kg (297 lb) IBW/kg (Calculated) : 57  Temp (24hrs), Avg:98.7 F (37.1 C), Min:97.3 F (36.3 C), Max:99.4 F (37.4 C)  Recent Labs  Lab 01/30/20 1538 01/30/20 2011 01/30/20 2144 01/31/20 0329 01/31/20 0711  WBC CANCELED 20.4*  --  16.2*  --   CREATININE 0.57 0.54  --  0.54  --   LATICACIDVEN  --  2.2* 1.8 1.3 1.3    Estimated Creatinine Clearance: 156 mL/min (by C-G formula based on SCr of 0.54 mg/dL).    No Known Allergies  Antimicrobials this admission: 12/10 Cefepime >>  12/10 Vancomycin >>   Dose adjustments this admission: 12/11 vanc 1250 q8 >> 1500 q12  Microbiology results: 12/10 AbscessCx: pending, nothing on stain 12/10 UCx: sent 12/11 BCx: ngtd  Thank you for allowing pharmacy to be a part of this patient's care.  Kathern Lobosco A PharmD 01/31/2020 10:53 AM

## 2020-01-31 NOTE — Progress Notes (Signed)
MD is paged regarding Pt's diet order and pain medication.

## 2020-01-31 NOTE — Telephone Encounter (Signed)
Received call from on-call service today regarding elevated CK results of 13,116. Patient has been admitted to hospital with diagnosis of Sepsis. Last CK reading revealed decreasing levels at 10,484.

## 2020-01-31 NOTE — H&P (Addendum)
History and Physical    Haley GarinSamone J Castro ZOX:[id number] DOB: March 19, 2000 DOA: 01/30/2020  PCP: Barbette MerinoKing, Crystal M, NP  Patient coming from: Home.  Chief Complaint: Right buttock possible abscess.  HPI: Haley Sandoval is a 19 y.o. female with history of recent admission at [facility] for rhabdomyolysis and possible pelvic inflammatory disease initially CK levels at that time was found to be 1 31,000 LFTs were elevated.  Gastroenterology was consulted at that time and felt that patient's elevated LFTs were likely from rhabdomyolysis.  CT abdomen pelvis done showed features concerning for pelvic inflammatory fat stranding which may represent sequela of mild mesenteritis versus pelvic inflammatory disease patient was empirically treated with ceftriaxone doxycycline metronidazole.  Subsequent which patient also started on steroids for the rhabdomyolysis and rhabdomyolysis trended down and at the time of discharge was around 13,000.  Patient continued to have pain and noticed some increasing swelling of the right buttock area concerning for abscess and presented to the ER today.  Patient states the pain has been worsening particular in the lower extremities and upper extremities.  Denies any nausea vomiting or diarrhea.  Has been having some subjective feeling of fever chills.  Patient states she has been taking the tapering dose of steroids.  Denies any vaginal discharge.  ED Course: In the ER patient was found to have a small abscess to right buttock which was drained by the ER physician and started on empiric antibiotics.  On my exam patient has significant tenderness in the thigh areas.  Patient also complains of swelling in the upper extremities.  Abdomen is nontender.  Labs are significant for CK level of 11,283 with lactic acid of 2.2 which improved with fluids to 1.8 WBC count of 20.8 LFTs are showing a downtrend of AST of 270 ALT of 78.  Covid test is negative.  Pregnant screen is negative.  I  have ordered a repeat CT pelvis.   Review of Systems: As per HPI, rest all negative.   Past Medical History:  Diagnosis Date  . Seasonal allergies     Past Surgical History:  Procedure Laterality Date  . WISDOM TOOTH EXTRACTION       reports that she has never smoked. She has never used smokeless tobacco. She reports previous alcohol use. She reports previous drug use.  No Known Allergies  Family History  Problem Relation Age of Onset  . Hypertension Mother   . Colon cancer Neg Hx   . Colon polyps Neg Hx   . Liver disease Neg Hx   . Sickle cell anemia Neg Hx     Prior to Admission medications   Medication Sig Start Date End Date Taking? Authorizing Provider  methocarbamol (ROBAXIN) 500 MG tablet Take 1 tablet (500 mg total) by mouth every 6 (six) hours as needed for muscle spasms. 01/22/20   Sherryll BurgerShah, Pratik D, DO  oxyCODONE (OXY IR/ROXICODONE) 5 MG immediate release tablet Take 1 tablet (5 mg total) by mouth every 4 (four) hours as needed for moderate pain or severe pain. 01/22/20   Sherryll BurgerShah, Pratik D, DO  predniSONE (DELTASONE) 10 MG tablet Take 6 tablets (60 mg total) by mouth daily for 7 days, THEN 5 tablets (50 mg total) daily for 7 days, THEN 4 tablets (40 mg total) daily for 7 days, THEN 3.5 tablets (35 mg total) daily for 7 days, THEN 3 tablets (30 mg total) daily for 7 days, THEN 2.5 tablets (25 mg total) daily for 7 days, THEN 2 tablets (  20 mg total) daily for 7 days, THEN 1.5 tablets (15 mg total) daily for 7 days, THEN 1 tablet (10 mg total) daily for 7 days, THEN 0.5 tablets (5 mg total) daily for 7 days. 01/22/20 04/01/20  Maurilio Lovely D, DO    Physical Exam: Constitutional: Moderately built and nourished. Vitals:   01/30/20 2330 01/30/20 2345 01/31/20 0017 01/31/20 0032  BP: 104/61 111/70  (!) 147/82  Pulse: (!) 112 (!) 103  (!) 108  Resp: (!) 24 16  17   Temp:   98.9 F (37.2 C) 98.7 F (37.1 C)  TempSrc:   Oral Oral  SpO2: 99% 100%  98%  Weight:      Height:        Eyes: Anicteric no pallor. ENMT: No discharge from the ears eyes nose or mouth. Neck: No mass felt.  No neck rigidity. Respiratory: No rhonchi or crepitations. Cardiovascular: S1-S2 heard. Abdomen: Soft nontender bowel sounds present. Musculoskeletal: Tenderness around the proximal thigh areas. Skin: Skin around the right buttock area has been drained. Neurologic: Alert awake oriented to time place and person.  Moves all extremities. Psychiatric: Appears normal.  Normal affect.   Labs on Admission: I have personally reviewed following labs and imaging studies  CBC: Recent Labs  Lab 01/30/20 2011  WBC 20.4*  NEUTROABS 15.9*  HGB 13.1  HCT 39.9  MCV 87.1  PLT 367   Basic Metabolic Panel: Recent Labs  Lab 01/30/20 2011  NA 133*  K 4.0  CL 100  CO2 23  GLUCOSE 118*  BUN 14  CREATININE 0.54  CALCIUM 8.5*   GFR: Estimated Creatinine Clearance: 156 mL/min (by C-G formula based on SCr of 0.54 mg/dL). Liver Function Tests: Recent Labs  Lab 01/30/20 2011  AST 270*  ALT 78*  ALKPHOS 53  BILITOT 0.4  PROT 6.3*  ALBUMIN 2.9*   No results for input(s): LIPASE, AMYLASE in the last 168 hours. No results for input(s): AMMONIA in the last 168 hours. Coagulation Profile: Recent Labs  Lab 01/30/20 2011  INR 1.0   Cardiac Enzymes: Recent Labs  Lab 01/30/20 2011  CKTOTAL 11,283*   BNP (last 3 results) No results for input(s): PROBNP in the last 8760 hours. HbA1C: Recent Labs    01/30/20 1432  HGBA1C 5.4   CBG: No results for input(s): GLUCAP in the last 168 hours. Lipid Profile: No results for input(s): CHOL, HDL, LDLCALC, TRIG, CHOLHDL, LDLDIRECT in the last 72 hours. Thyroid Function Tests: No results for input(s): TSH, T4TOTAL, FREET4, T3FREE, THYROIDAB in the last 72 hours. Anemia Panel: No results for input(s): VITAMINB12, FOLATE, FERRITIN, TIBC, IRON, RETICCTPCT in the last 72 hours. Urine analysis:    Component Value Date/Time   COLORURINE  YELLOW 01/30/2020 2012   APPEARANCEUR HAZY (A) 01/30/2020 2012   LABSPEC 1.029 01/30/2020 2012   PHURINE 5.0 01/30/2020 2012   GLUCOSEU NEGATIVE 01/30/2020 2012   HGBUR MODERATE (A) 01/30/2020 2012   BILIRUBINUR NEGATIVE 01/30/2020 2012   BILIRUBINUR negative 01/30/2020 1441   KETONESUR NEGATIVE 01/30/2020 2012   PROTEINUR 30 (A) 01/30/2020 2012   UROBILINOGEN 0.2 01/30/2020 1441   UROBILINOGEN 0.2 03/30/2013 1044   NITRITE NEGATIVE 01/30/2020 2012   LEUKOCYTESUR NEGATIVE 01/30/2020 2012   Sepsis Labs: @LABRCNTIP (procalcitonin:4,lacticidven:4) ) Recent Results (from the past 240 hour(s))  Resp Panel by RT-PCR (Flu A&B, Covid) Nasopharyngeal Swab     Status: None   Collection Time: 01/30/20 11:08 PM   Specimen: Nasopharyngeal Swab; Nasopharyngeal(NP) swabs in vial transport medium  Result Value Ref Range Status   SARS Coronavirus 2 by RT PCR NEGATIVE NEGATIVE Final    Comment: (NOTE) SARS-CoV-2 target nucleic acids are NOT DETECTED.  The SARS-CoV-2 RNA is generally detectable in upper respiratory specimens during the acute phase of infection. The lowest concentration of SARS-CoV-2 viral copies this assay can detect is 138 copies/mL. A negative result does not preclude SARS-Cov-2 infection and should not be used as the sole basis for treatment or other patient management decisions. A negative result may occur with  improper specimen collection/handling, submission of specimen other than nasopharyngeal swab, presence of viral mutation(s) within the areas targeted by this assay, and inadequate number of viral copies(<138 copies/mL). A negative result must be combined with clinical observations, patient history, and epidemiological information. The expected result is Negative.  Fact Sheet for Patients:  BloggerCourse.com  Fact Sheet for Healthcare Providers:  SeriousBroker.it  This test is no t yet approved or cleared by the  [place] FDA and  has been authorized for detection and/or diagnosis of SARS-CoV-2 by FDA under an Emergency Use Authorization (EUA). This EUA will remain  in effect (meaning this test can be used) for the duration of the COVID-19 declaration under Section 564(b)(1) of the Act, 21 U.S.C.section 360bbb-3(b)(1), unless the authorization is terminated  or revoked sooner.       Influenza A by PCR NEGATIVE NEGATIVE Final   Influenza B by PCR NEGATIVE NEGATIVE Final    Comment: (NOTE) The Xpert Xpress SARS-CoV-2/FLU/RSV plus assay is intended as an aid in the diagnosis of influenza from Nasopharyngeal swab specimens and should not be used as a sole basis for treatment. Nasal washings and aspirates are unacceptable for Xpert Xpress SARS-CoV-2/FLU/RSV testing.  Fact Sheet for Patients: BloggerCourse.com  Fact Sheet for Healthcare Providers: SeriousBroker.it  This test is not yet approved or cleared by the [place] FDA and has been authorized for detection and/or diagnosis of SARS-CoV-2 by FDA under an Emergency Use Authorization (EUA). This EUA will remain in effect (meaning this test can be used) for the duration of the COVID-19 declaration under Section 564(b)(1) of the Act, 21 U.S.C. section 360bbb-3(b)(1), unless the authorization is terminated or revoked.  Performed at [facility], [address] [place], [place] [address]      Radiological Exams on Admission: No results found.  EKG: Independently reviewed.  Sinus tachycardia.  Assessment/Plan Principal Problem:   Sepsis (HCC) Active Problems:   Rhabdomyolysis   Elevated liver enzymes   Obesity, Class III, BMI 40-49.9 (morbid obesity) (HCC)   Cellulitis    1. SIRS/possible sepsis presently has had a right buttock abscess drained will follow cultures continue regarding biotics.  I have also ordered a CT pelvis since patient had features  concerning for PID during last admission 2 weeks ago. 2. Rhabdomyolysis cause not clear.  Patient was on steroids and presently on a tapering dose which patient does not want to continue.  Patient has an appointment with Dr. Samuella Cota rheumatologist next Wednesday on December 15.  Patient may need biopsy if symptoms does not improve.  Will check MRI of the thigh.  Follow CK trends.  I will also order sed rate and CRP. 3. Elevated liver enzymes was seen by gastroenterologist and any pending gastrologist feels that patient's elevated liver enzymes were likely secondary to rhabdomyolysis.  Follow LFTs.  Addendum -patient CT pelvis shows persistent pelvic inflammatory disease with some improvement.  I discussed with Dr. Vergie Living on-call OB/GYN who advised me to get  transvaginal ultrasound also chlamydia and gonorrhea probe.  To reconsult them in the morning after getting the transvaginal ultrasound results.   DVT prophylaxis: Lovenox. Code Status: Full code. Family Communication: Discussed with patient. Disposition Plan: Home. Consults called: OB/GYN. Admission status: Inpatient.   Eduard Clos MD [facility] Pager [phone].  If 7PM-7AM, please contact night-coverage [email or web address] Password [email or web address]  01/31/2020, 1:49 AM

## 2020-01-31 NOTE — Progress Notes (Signed)
Pharmacy Antibiotic Note  Haley Sandoval is a 19 y.o. female admitted on 01/30/2020 with sepsis , wound infection. Pharmacy has been consulted for Vancomycin & Cefepime dosing.  Plan: Cefepime 2gm q8 Vanc 2gm x1, then 1250mg  q8  Height: 5\' 5"  (165.1 cm) Weight: 134.7 kg (297 lb) IBW/kg (Calculated) : 57  Temp (24hrs), Avg:98.6 F (37 C), Min:97.3 F (36.3 C), Max:99.3 F (37.4 C)  Recent Labs  Lab 01/30/20 2011 01/30/20 2144  WBC 20.4*  --   CREATININE 0.54  --   LATICACIDVEN 2.2* 1.8    Estimated Creatinine Clearance: 156 mL/min (by C-G formula based on SCr of 0.54 mg/dL).    No Known Allergies  Antimicrobials this admission: 12/10 Cefepime >>  12/10 Vancomycin >>   Dose adjustments this admission:  Microbiology results: 12/10 AbscessCx: sent 12/10 UCx: sent   Thank you for allowing pharmacy to be a part of this patient's care.  14/10 PharmD 01/31/2020 2:16 AM

## 2020-01-31 NOTE — Progress Notes (Signed)
PROGRESS NOTE    Haley Sandoval  JXB:[id number] DOB: 04/10/2000 DOA: 01/30/2020 PCP: Barbette Merino, NP     Brief Narrative:  19 y.o. BF PMHx obesity class III, rhabdomyolysis, PID, recent admission at [facility] for rhabdomyolysis and possible pelvic inflammatory disease initially CK levels at that time was found to be 1 31,000 LFTs were elevated.  Gastroenterology was consulted at that time and felt that patient's elevated LFTs were likely from rhabdomyolysis.  CT abdomen pelvis done showed features concerning for pelvic inflammatory fat stranding which may represent sequela of mild mesenteritis versus pelvic inflammatory disease patient was empirically treated with ceftriaxone doxycycline metronidazole.  Subsequent which patient also started on steroids for the rhabdomyolysis and rhabdomyolysis trended down and at the time of discharge was around 13,000.    Patient continued to have pain and noticed some increasing swelling of the right buttock area concerning for abscess and presented to the ER today.  Patient states the pain has been worsening particular in the lower extremities and upper extremities.  Denies any nausea vomiting or diarrhea.  Has been having some subjective feeling of fever chills.  Patient states she has been taking the tapering dose of steroids.  Denies any vaginal discharge.  ED Course: In the ER patient was found to have a small abscess to right buttock which was drained by the ER physician and started on empiric antibiotics.  On my exam patient has significant tenderness in the thigh areas.  Patient also complains of swelling in the upper extremities.  Abdomen is nontender.  Labs are significant for CK level of 11,283 with lactic acid of 2.2 which improved with fluids to 1.8 WBC count of 20.8 LFTs are showing a downtrend of AST of 270 ALT of 78.  Covid test is negative.  Pregnant screen is negative.  I have ordered a repeat CT pelvis.    Subjective: Per  patient in March had small rash on her right buttocks.  Then in November rash began to enlarge and become painful, at that point patient also began to have pain in her lower extremities whenever she walked up steps or rose from a chair.  In addition also began to have pain in bilateral upper extremities whenever she reached over her head for objects.  No other rashes on her body.  No family history of autoimmune diseases.  States only took 3 days of the steroids prescribed to her at [facility].     Assessment & Plan: Covid vaccination;   Principal Problem:   Sepsis (HCC) Active Problems:   Rhabdomyolysis   Elevated liver enzymes   Obesity, Class III, BMI 40-49.9 (morbid obesity) (HCC)   Cellulitis  Severe sepsis/PID -On admission meets criteria for sepsis HR> 90,> 12 K, lactic acid> 2 multiple points of infection to include abscess on buttocks, PID confirmed on CT scan. -Continue current antibiotics -Lactated Ringer's 248ml/hr -Trend lactic acid/procalcitonin  Rhabdomyolysis -See severe sepsis -Trend CK   Myositis -Patient signs and symptoms are consistent with a myositis.  Patient only took 3 days of steroids. -Patient requires muscle biopsy however would not be safe until sepsis and rhabdomyolysis resolved. -No family history of autoimmune disease. -12/12 sendoff myositis panel -12/12 will contact surgery although I expect they will not want to perform biopsy for at least 5 or 6 days.  Elevated liver enzymes -Most likely secondary to rhabdomyolysis. -Acute hepatitis panel pending  Morbidly obese    12/11 addendum -patient CT pelvis shows persistent pelvic  inflammatory disease with some improvement.  I discussed with Dr. Vergie Living on-call OB/GYN who advised me to get transvaginal ultrasound also chlamydia and gonorrhea probe.  To reconsult them in the morning after getting the transvaginal ultrasound results. -GC probe pending -Transvaginal ultrasound completed but  not read  DVT prophylaxis: Lovenox Code Status: Full Family Communication: 12/11 boyfriend at bedside for discussion of plan of care answered all questions Status is: Inpatient    Dispo: The patient is from:               Anticipated d/c is to: Home              Anticipated d/c date is: 12/20              Patient currently unstable      Consultants:    Procedures/Significant Events:  12/11 CT pelvis W contrast;-defect in the posterior subcutaneous soft tissues overlying the right gluteal muscle. In this region, there is fat  induration without evidence for an abscess. -persistent diffuse inflammatory changes in the pelvis with a small amount of free fluid. This is similar but slightly improved from prior study. -Circumferential wall thickening of the urinary bladder. Correlation with urinalysis is recommended to exclude cystitis. -Apparent intracompartmental edema involving the proximal thighs bilaterally with possible intramuscular edema bilaterally is nonspecific but can be seen in patients with rhabdomyolysis. Correlation with laboratory studies, specifically CK, is recommended. 12/11 MRI RIGHT femur/pelvis W0 contrast;Severe diffuse myositis. Findings could be due to post infectious myositis, paraneoplastic process, autoimmune disorders and drug reactions. 2. Open wound in the right buttock area. No abscess. 3. No findings for septic arthritis or osteomyelitis.    I have personally reviewed and interpreted all radiology studies and my findings are as above.  VENTILATOR SETTINGS:    Cultures 12/10 SARS coronavirus negative 12/10 influenza A/B negative 12/10 urine pending 12/10 abscess of buttock NGTD 12/11 blood pending    Antimicrobials: Anti-infectives (From admission, onward)   Start     Ordered Stop   01/31/20 1200  vancomycin (VANCOREADY) IVPB 1250 mg/250 mL        01/31/20 0230     01/31/20 1000  ceFEPIme (MAXIPIME) 2 g in sodium chloride 0.9 % 100 mL  IVPB        01/31/20 0226     01/31/20 0330  vancomycin (VANCOREADY) IVPB 2000 mg/400 mL        01/31/20 0146 01/31/20 0517   01/31/20 0300  ceFEPIme (MAXIPIME) 2 g in sodium chloride 0.9 % 100 mL IVPB        01/31/20 0146 01/31/20 0247   01/30/20 2300  ceFAZolin (ANCEF) IVPB 1 g/50 mL premix        01/30/20 2258 01/30/20 2332       Devices    LINES / TUBES:      Continuous Infusions: . ceFEPime (MAXIPIME) IV    . lactated ringers 150 mL/hr at 01/31/20 0106  . vancomycin       Objective: Vitals:   01/30/20 2345 01/31/20 0017 01/31/20 0032 01/31/20 0459  BP: 111/70  (!) 147/82 (!) 148/90  Pulse: (!) 103  (!) 108 100  Resp: Temp:  98.9 F (37.2 C) 98.7 F (37.1 C) 98.3 F (36.8 C)  TempSrc:  Oral Oral Oral  SpO2: 100%  98% 99%  Weight:      Height:        Intake/Output Summary (Last 24 hours) at 01/31/2020 0756 Last data  filed at 01/31/2020 0700 Gross per 24 hour  Intake 1960.15 ml  Output 200 ml  Net 1760.15 ml   Filed Weights   01/30/20 2144  Weight: 134.7 kg    Examination:  General: A/O x4 No acute respiratory distress Eyes: negative scleral hemorrhage, negative anisocoria, negative icterus ENT: Negative Runny nose, negative gingival bleeding, Neck:  Negative scars, masses, torticollis, lymphadenopathy, JVD Lungs: Clear to auscultation bilaterally without wheezes or crackles Cardiovascular: Regular rate and rhythm without murmur gallop or rub normal S1 and S2 Abdomen: MORBIDLY OBESE abdominal pain, nondistended, positive soft, bowel sounds, no rebound, no ascites, no appreciable mass Extremities: No significant cyanosis, clubbing, or edema bilateral lower extremities Skin: Large area on right buttocks cheek were abscess was drained, wick still within surgical incision site Psychiatric:  Negative depression, negative anxiety, negative fatigue, negative mania  Central nervous system:  Cranial nerves II through XII intact, tongue/uvula  midline, all extremities muscle strength 5/5, sensation intact throughout,  negative dysarthria, negative expressive aphasia, negative receptive aphasia.  .     Data Reviewed: Care during the described time interval was provided by me .  I have reviewed this patient's available data, including medical history, events of note, physical examination, and all test results as part of my evaluation.  CBC: Recent Labs  Lab 01/30/20 1538 01/30/20 2011 01/31/20 0329  WBC CANCELED 20.4* 16.2*  NEUTROABS  --  15.9* 11.3*  HGB CANCELED 13.1 11.7*  HCT CANCELED 39.9 36.6  MCV  --  87.1 89.1  PLT CANCELED 367 313   Basic Metabolic Panel: Recent Labs  Lab 01/30/20 1538 01/30/20 2011 01/31/20 0329  NA 135 133* 134*  K 4.5 4.0 4.1  CL 100 100 102  CO2  --  23 22  GLUCOSE 85 118* 98  BUN 11 14 13   CREATININE 0.57 0.54 0.54  CALCIUM 8.8 8.5* 8.1*   GFR: Estimated Creatinine Clearance: 156 mL/min (by C-G formula based on SCr of 0.54 mg/dL). Liver Function Tests: Recent Labs  Lab 01/30/20 1538 01/30/20 2011 01/31/20 0329  AST 301* 270* 247*  ALT  --  78* 70*  ALKPHOS 53 53 36*  BILITOT 0.3 0.4 0.4  PROT 6.0 6.3* 5.5*  ALBUMIN 3.3* 2.9* 2.6*   No results for input(s): LIPASE, AMYLASE in the last 168 hours. No results for input(s): AMMONIA in the last 168 hours. Coagulation Profile: Recent Labs  Lab 01/30/20 2011  INR 1.0   Cardiac Enzymes: Recent Labs  Lab 01/30/20 1538 01/30/20 2011 01/31/20 0329  CKTOTAL 13,116* 11,283* 10,484*   BNP (last 3 results) No results for input(s): PROBNP in the last 8760 hours. HbA1C: Recent Labs    01/30/20 1432  HGBA1C 5.4   CBG: No results for input(s): GLUCAP in the last 168 hours. Lipid Profile: No results for input(s): CHOL, HDL, LDLCALC, TRIG, CHOLHDL, LDLDIRECT in the last 72 hours. Thyroid Function Tests: No results for input(s): TSH, T4TOTAL, FREET4, T3FREE, THYROIDAB in the last 72 hours. Anemia Panel: No results for  input(s): VITAMINB12, FOLATE, FERRITIN, TIBC, IRON, RETICCTPCT in the last 72 hours. Sepsis Labs: Recent Labs  Lab 01/30/20 2011 01/30/20 2144 01/31/20 0329  PROCALCITON  --   --  <0.10  LATICACIDVEN 2.2* 1.8 1.3    Recent Results (from the past 240 hour(s))  Wound or Superficial Culture     Status: None (Preliminary result)   Collection Time: 01/30/20  9:33 PM   Specimen: Abscess; Wound  Result Value Ref Range Status   Specimen  Description   Final    ABSCESS Performed at [facility], [address], [place], [place] [address]    Special Requests   Final    NONE Performed at [facility], [address] [place], [place] [address]    Gram Stain   Final    NO WBC SEEN NO ORGANISMS SEEN Performed at [facility], [address] [place], [place] [address]    Culture PENDING  Incomplete   Report Status PENDING  Incomplete  Resp Panel by RT-PCR (Flu A&B, Covid) Nasopharyngeal Swab     Status: None   Collection Time: 01/30/20 11:08 PM   Specimen: Nasopharyngeal Swab; Nasopharyngeal(NP) swabs in vial transport medium  Result Value Ref Range Status   SARS Coronavirus 2 by RT PCR NEGATIVE NEGATIVE Final    Comment: (NOTE) SARS-CoV-2 target nucleic acids are NOT DETECTED.  The SARS-CoV-2 RNA is generally detectable in upper respiratory specimens during the acute phase of infection. The lowest concentration of SARS-CoV-2 viral copies this assay can detect is 138 copies/mL. A negative result does not preclude SARS-Cov-2 infection and should not be used as the sole basis for treatment or other patient management decisions. A negative result may occur with  improper specimen collection/handling, submission of specimen other than nasopharyngeal swab, presence of viral mutation(s) within the areas targeted by this assay, and inadequate number of viral copies(<138 copies/mL). A negative result must be combined with clinical observations,  patient history, and epidemiological information. The expected result is Negative.  Fact Sheet for Patients:  BloggerCourse.com  Fact Sheet for Healthcare Providers:  SeriousBroker.it  This test is no t yet approved or cleared by the [place] FDA and  has been authorized for detection and/or diagnosis of SARS-CoV-2 by FDA under an Emergency Use Authorization (EUA). This EUA will remain  in effect (meaning this test can be used) for the duration of the COVID-19 declaration under Section 564(b)(1) of the Act, 21 U.S.C.section 360bbb-3(b)(1), unless the authorization is terminated  or revoked sooner.       Influenza A by PCR NEGATIVE NEGATIVE Final   Influenza B by PCR NEGATIVE NEGATIVE Final    Comment: (NOTE) The Xpert Xpress SARS-CoV-2/FLU/RSV plus assay is intended as an aid in the diagnosis of influenza from Nasopharyngeal swab specimens and should not be used as a sole basis for treatment. Nasal washings and aspirates are unacceptable for Xpert Xpress SARS-CoV-2/FLU/RSV testing.  Fact Sheet for Patients: BloggerCourse.com  Fact Sheet for Healthcare Providers: SeriousBroker.it  This test is not yet approved or cleared by the [place] FDA and has been authorized for detection and/or diagnosis of SARS-CoV-2 by FDA under an Emergency Use Authorization (EUA). This EUA will remain in effect (meaning this test can be used) for the duration of the COVID-19 declaration under Section 564(b)(1) of the Act, 21 U.S.C. section 360bbb-3(b)(1), unless the authorization is terminated or revoked.  Performed at [facility], [address], [place], [place] [address]          Radiology Studies: CT PELVIS W CONTRAST  Result Date: 01/31/2020 CLINICAL DATA:  Anorectal abscess. EXAM: CT PELVIS WITH CONTRAST TECHNIQUE: Multidetector CT imaging of the pelvis  was performed using the standard protocol following the bolus administration of intravenous contrast. CONTRAST:  OMNIPAQUE IOHEXOL 300 MG/ML  SOLN COMPARISON:  January 18, 2020 FINDINGS: Urinary Tract: There is circumferential wall thickening of the of the urinary bladder. Bowel: The visualized bowel is unremarkable. The appendix  is unremarkable. Vascular/Lymphatic: There is no acute vascular abnormality detected. There are mildly enlarged bilateral inguinal lymph nodes, right worse than left. Reproductive:  No mass or other significant abnormality Other: There are persistent diffuse inflammatory changes in the pelvis with a small amount of free fluid. This is similar but slightly improved from prior study. Musculoskeletal: There is diffuse intracompartmental edema within the proximal thighs. There may be some edema involving some of the musculature of the proximal thighs bilaterally. This appearance is near symmetric. There is a defect in the posterior subcutaneous soft tissues overlying the right gluteal muscle. In this region, there is fat induration without evidence for an abscess. There is nonspecific edema along the patient's flanks. IMPRESSION: 1. There is a defect in the posterior subcutaneous soft tissues overlying the right gluteal muscle. In this region, there is fat induration without evidence for an abscess. 2. There are persistent diffuse inflammatory changes in the pelvis with a small amount of free fluid. This is similar but slightly improved from prior study. 3. Circumferential wall thickening of the urinary bladder. Correlation with urinalysis is recommended to exclude cystitis. 4. Apparent intracompartmental edema involving the proximal thighs bilaterally with possible intramuscular edema bilaterally is nonspecific but can be seen in patients with rhabdomyolysis. Correlation with laboratory studies, specifically CK, is recommended. Electronically Signed   By: Katherine Mantlehristopher  Green M.D.   On:  01/31/2020 03:27        Scheduled Meds: . sodium chloride (PF)       Continuous Infusions: . ceFEPime (MAXIPIME) IV    . lactated ringers 150 mL/hr at 01/31/20 0106  . vancomycin       LOS: 0 days    Time spent:40 min    Jester Klingberg, Roselind MessierURTIS J, MD [facility] Pager [phone]  If 7PM-7AM, please contact night-coverage [email or web address] Password [email or web address] 01/31/2020, 7:56 AM

## 2020-01-31 NOTE — Plan of Care (Signed)
  Problem: Education: Goal: Knowledge of General Education information will improve Description: Including pain rating scale, medication(s)/side effects and non-pharmacologic comfort measures Outcome: Progressing   Problem: Activity: Goal: Risk for activity intolerance will decrease Outcome: Progressing   Problem: Pain Managment: Goal: General experience of comfort will improve Outcome: Not Progressing   Problem: Safety: Goal: Ability to remain free from injury will improve Outcome: Progressing   Problem: Skin Integrity: Goal: Risk for impaired skin integrity will decrease Outcome: Progressing

## 2020-01-31 NOTE — Plan of Care (Signed)
?  Problem: Education: ?Goal: Knowledge of General Education information will improve ?Description: Including pain rating scale, medication(s)/side effects and non-pharmacologic comfort measures ?Outcome: Progressing ?  ?Problem: Health Behavior/Discharge Planning: ?Goal: Ability to manage health-related needs will improve ?Outcome: Progressing ?  ?Problem: Clinical Measurements: ?Goal: Ability to maintain clinical measurements within normal limits will improve ?Outcome: Progressing ?  ?Problem: Clinical Measurements: ?Goal: Respiratory complications will improve ?Outcome: Progressing ?  ?Problem: Clinical Measurements: ?Goal: Cardiovascular complication will be avoided ?Outcome: Progressing ?  ?

## 2020-02-01 ENCOUNTER — Inpatient Hospital Stay: Payer: Self-pay

## 2020-02-01 LAB — CBC WITH DIFFERENTIAL/PLATELET
Abs Immature Granulocytes: 0.66 10*3/uL — ABNORMAL HIGH (ref 0.00–0.07)
Basophils Absolute: 0.1 10*3/uL (ref 0.0–0.1)
Basophils Relative: 1 %
Eosinophils Absolute: 0.1 10*3/uL (ref 0.0–0.5)
Eosinophils Relative: 1 %
HCT: 38.7 % (ref 36.0–46.0)
Hemoglobin: 12.3 g/dL (ref 12.0–15.0)
Immature Granulocytes: 5 %
Lymphocytes Relative: 14 %
Lymphs Abs: 1.9 10*3/uL (ref 0.7–4.0)
MCH: 28.7 pg (ref 26.0–34.0)
MCHC: 31.8 g/dL (ref 30.0–36.0)
MCV: 90.4 fL (ref 80.0–100.0)
Monocytes Absolute: 1.4 10*3/uL — ABNORMAL HIGH (ref 0.1–1.0)
Monocytes Relative: 10 %
Neutro Abs: 9.4 10*3/uL — ABNORMAL HIGH (ref 1.7–7.7)
Neutrophils Relative %: 69 %
Platelets: 329 10*3/uL (ref 150–400)
RBC: 4.28 MIL/uL (ref 3.87–5.11)
RDW: 15.9 % — ABNORMAL HIGH (ref 11.5–15.5)
WBC: 13.6 10*3/uL — ABNORMAL HIGH (ref 4.0–10.5)
nRBC: 0 % (ref 0.0–0.2)

## 2020-02-01 LAB — COMPREHENSIVE METABOLIC PANEL
ALT: 62 U/L — ABNORMAL HIGH (ref 0–44)
AST: 205 U/L — ABNORMAL HIGH (ref 15–41)
Albumin: 2.4 g/dL — ABNORMAL LOW (ref 3.5–5.0)
Alkaline Phosphatase: 41 U/L (ref 38–126)
Anion gap: 11 (ref 5–15)
BUN: 16 mg/dL (ref 6–20)
CO2: 22 mmol/L (ref 22–32)
Calcium: 8.3 mg/dL — ABNORMAL LOW (ref 8.9–10.3)
Chloride: 104 mmol/L (ref 98–111)
Creatinine, Ser: 0.6 mg/dL (ref 0.44–1.00)
GFR, Estimated: >60 mL/min (ref >60)
Glucose, Bld: 107 mg/dL — ABNORMAL HIGH (ref 70–99)
Potassium: 3.8 mmol/L (ref 3.5–5.1)
Sodium: 137 mmol/L (ref 135–145)
Total Bilirubin: 0.2 mg/dL — ABNORMAL LOW (ref 0.3–1.2)
Total Protein: 5.3 g/dL — ABNORMAL LOW (ref 6.5–8.1)

## 2020-02-01 LAB — HEPATITIS PANEL, ACUTE
HCV Ab: NONREACTIVE
Hep A IgM: NONREACTIVE
Hep B C IgM: NONREACTIVE
Hepatitis B Surface Ag: NONREACTIVE

## 2020-02-01 LAB — URINE CULTURE

## 2020-02-01 LAB — CK: Total CK: 9011 U/L — ABNORMAL HIGH (ref 38–234)

## 2020-02-01 LAB — PHOSPHORUS: Phosphorus: 4.9 mg/dL — ABNORMAL HIGH (ref 2.5–4.6)

## 2020-02-01 LAB — PROCALCITONIN: Procalcitonin: <0.1 ng/mL

## 2020-02-01 LAB — MAGNESIUM: Magnesium: 1.9 mg/dL (ref 1.7–2.4)

## 2020-02-01 LAB — RPR: RPR Ser Ql: NONREACTIVE

## 2020-02-01 MED ORDER — MORPHINE SULFATE (PF) 2 MG/ML IV SOLN
2.0000 mg | INTRAVENOUS | Status: DC | PRN
  Administered 2020-02-01 – 2020-02-02 (×3): 4 mg via INTRAVENOUS
  Administered 2020-02-03 – 2020-02-06 (×5): 2 mg via INTRAVENOUS
  Administered 2020-02-06: 4 mg via INTRAVENOUS
  Administered 2020-02-06: 3 mg via INTRAVENOUS
  Administered 2020-02-07 – 2020-02-08 (×9): 4 mg via INTRAVENOUS
  Administered 2020-02-10 – 2020-02-13 (×3): 2 mg via INTRAVENOUS
  Administered 2020-02-14 – 2020-02-16 (×3): 4 mg via INTRAVENOUS
  Filled 2020-02-01: qty 1
  Filled 2020-02-01 (×2): qty 2
  Filled 2020-02-01: qty 1
  Filled 2020-02-01 (×3): qty 2
  Filled 2020-02-01: qty 1
  Filled 2020-02-01: qty 2
  Filled 2020-02-01: qty 1
  Filled 2020-02-01 (×3): qty 2
  Filled 2020-02-01 (×3): qty 1
  Filled 2020-02-01 (×2): qty 2
  Filled 2020-02-01: qty 1
  Filled 2020-02-01 (×6): qty 2
  Filled 2020-02-01: qty 1

## 2020-02-01 MED ORDER — LACTATED RINGERS IV SOLN: INTRAVENOUS | Status: DC

## 2020-02-01 MED ORDER — OXYCODONE HCL ER 10 MG PO T12A
10.0000 mg | EXTENDED_RELEASE_TABLET | Freq: Two times a day (BID) | ORAL | Status: DC
  Administered 2020-02-01 – 2020-02-16 (×29): 10 mg via ORAL
  Filled 2020-02-01 (×30): qty 1

## 2020-02-01 NOTE — Plan of Care (Signed)
  Problem: Education: Goal: Knowledge of General Education information will improve Description: Including pain rating scale, medication(s)/side effects and non-pharmacologic comfort measures Outcome: Progressing   Problem: Clinical Measurements: Goal: Diagnostic test results will improve Outcome: Progressing   

## 2020-02-01 NOTE — Consult Note (Addendum)
GYN Note Transvaginal u/s unremarkable. 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 Pecolia Ades won't be back until tomorrow. Information left and I asked he could read, as best as he can since it's a non contrast study, if there are any abnormalities with her GYN structures.   We will follow up and let the primary team know if anything is seen on MRI.   Cornelia Copa MD Attending Center for [facility]Faculty [facility]) GYN Consult Phone: [phone] (M-F, [phone]) & [phone] (Off hours, weekends, holidays)

## 2020-02-01 NOTE — Progress Notes (Signed)
PROGRESS NOTE    TIAUNA WHISNANT  ZJQ:[id number] DOB: 2000-07-18 DOA: 01/30/2020 PCP: Barbette Merino, NP     Brief Narrative:  19 y.o. BF PMHx obesity class III, rhabdomyolysis, PID, recent admission at [facility] : [facility] for rhabdomyolysis and possible pelvic inflammatory disease initially CK levels at that time was found to be 1 31,000 LFTs were elevated.  Gastroenterology was consulted at that time and felt that patient's elevated LFTs were likely from rhabdomyolysis.  CT abdomen pelvis done showed features concerning for pelvic inflammatory fat stranding which may represent sequela of mild mesenteritis versus pelvic inflammatory disease patient was empirically treated with ceftriaxone doxycycline metronidazole.  Subsequent which patient also started on steroids for the rhabdomyolysis and rhabdomyolysis trended down and at the time of discharge was around 13,000.    Patient continued to have pain and noticed some increasing swelling of the right buttock area concerning for abscess and presented to the ER today.  Patient states the pain has been worsening particular in the lower extremities and upper extremities.  Denies any nausea vomiting or diarrhea.  Has been having some subjective feeling of fever chills.  Patient states she has been taking the tapering dose of steroids.  Denies any vaginal discharge.  ED Course: In the ER patient was found to have a small abscess to right buttock which was drained by the ER physician and started on empiric antibiotics.  On my exam patient has significant tenderness in the thigh areas.  Patient also complains of swelling in the upper extremities.  Abdomen is nontender.  Labs are significant for CK level of 11,283 with lactic acid of 2.2 which improved with fluids to 1.8 WBC count of 20.8 LFTs are showing a downtrend of AST of 270 ALT of 78.  Covid test is negative.  Pregnant screen is negative.  I have ordered a repeat CT pelvis.    Subjective: 12/12  afebrile overnight.  (Elevated temp 37.8 C).  States overnight positive new rash on her right breast.  Investigation revealed started under the area and EKG sticker occupied while patient was in the ED. Per patient in March had small rash on her right buttocks.  Then in November rash began to enlarge and become painful, at that point patient also began to have pain in her lower extremities whenever she walked up steps or rose from a chair.  In addition also began to have pain in bilateral upper extremities whenever she reached over her head for objects.  No other rashes on her body.  No family history of autoimmune diseases.  States only took 3 days of the steroids prescribed to her at [facility].     Assessment & Plan: Covid vaccination;   Principal Problem:   Sepsis (HCC) Active Problems:   Rhabdomyolysis   Elevated liver enzymes   Obesity, Class III, BMI 40-49.9 (morbid obesity) (HCC)   Cellulitis   Morbidly obese (HCC)  Severe sepsis/PID -On admission meets criteria for sepsis HR> 90,> 12 K, lactic acid> 2 multiple points of infection to include abscess on buttocks, PID confirmed on CT scan. -Continue current antibiotics -Lactated Ringer's 28ml/hr -Trend lactic acid/procalcitonin Results for ILA, LANDOWSKI (MRN [id number]) as of 02/01/2020 16:50  Ref. Range 01/30/2020 20:11 01/30/2020 21:44 01/31/2020 03:29 01/31/2020 07:11  Lactic Acid, Venous Latest Ref Range: 0.5 - 1.9 mmol/L 2.2 (HH) 1.8 1.3 1.3   Results for HAZELENE, DOTEN (MRN [id number]) as of 02/01/2020 16:50  Ref. Range 01/31/2020 03:29 01/31/2020 21:36 02/01/2020  03:56  Procalcitonin Latest Units: ng/mL <0.10 <0.10 <0.10   Rhabdomyolysis -See severe sepsis -Trend CK  Lab Results  Component Value Date   CKTOTAL 9,011 (H) 02/01/2020   CKTOTAL 10,484 (H) 01/31/2020   CKTOTAL 11,283 (H) 01/30/2020   CKTOTAL 13,116 (HH) 01/30/2020   CKTOTAL 12,199 (H) 01/22/2020    Myositis -Patient signs and symptoms  are consistent with a myositis.  Patient only took 3 days of steroids. -Patient requires muscle biopsy however would not be safe until sepsis and rhabdomyolysis resolved. -No family history of autoimmune disease. -12/12 send off myositis panel -12/13 will contact surgery although I expect they will not want to perform biopsy for at least 5 or 6 days.  Elevated liver enzymes -Most likely secondary to rhabdomyolysis. -Acute hepatitis panel pending  Morbidly obese    12/11 addendum -patient CT pelvis shows persistent pelvic inflammatory disease with some improvement.  I discussed with Dr. Vergie Living on-call OB/GYN who advised me to get transvaginal ultrasound also chlamydia and gonorrhea probe.  To reconsult them in the morning after getting the transvaginal ultrasound results. -GC probe pending -Transvaginal ultrasound completed but not read  DVT prophylaxis: Lovenox Code Status: Full Family Communication: 12/12 brother at bedside for discussion of plan of care answered all questions Status is: Inpatient    Dispo: The patient is from:               Anticipated d/c is to: Home              Anticipated d/c date is: 12/20              Patient currently unstable      Consultants:  12/10 RIGHT buttocks abscess reintubated for better growth 12/11 RIGHT AC NGTD 12/11 RIGHT forearm NGTD  12/11 RPR negative 12/11 Chlamydia/gonorrhea pending 12/12 acute hepatitis panel negative    Procedures/Significant Events:  12/11 CT pelvis W contrast;-defect in the posterior subcutaneous soft tissues overlying the right gluteal muscle. In this region, there is fat  induration without evidence for an abscess. -persistent diffuse inflammatory changes in the pelvis with a small amount of free fluid. This is similar but slightly improved from prior study. -Circumferential wall thickening of the urinary bladder. Correlation with urinalysis is recommended to exclude cystitis. -Apparent  intracompartmental edema involving the proximal thighs bilaterally with possible intramuscular edema bilaterally is nonspecific but can be seen in patients with rhabdomyolysis. Correlation with laboratory studies, specifically CK, is recommended. 12/11 MRI RIGHT femur/pelvis W0 contrast;Severe diffuse myositis. Findings could be due to post infectious myositis, paraneoplastic process, autoimmune disorders and drug reactions. 2. Open wound in the right buttock area. No abscess. 3. No findings for septic arthritis or osteomyelitis. 12/11 transvaginal ultrasound;Uterus and endometrium are unremarkable. Ovaries are not visualized. No definite adnexal abnormality is noted 12/12 PICC line pending      I have personally reviewed and interpreted all radiology studies and my findings are as above.  VENTILATOR SETTINGS:    Cultures 12/10 SARS coronavirus negative 12/10 influenza A/B negative 12/10 urine pending 12/10 abscess of buttock NGTD 12/11 blood pending    Antimicrobials: Anti-infectives (From admission, onward)   Start     Ordered Stop   01/31/20 1200  vancomycin (VANCOREADY) IVPB 1250 mg/250 mL        01/31/20 0230     01/31/20 1000  ceFEPIme (MAXIPIME) 2 g in sodium chloride 0.9 % 100 mL IVPB        01/31/20 0226     01/31/20  0330  vancomycin (VANCOREADY) IVPB 2000 mg/400 mL        01/31/20 0146 01/31/20 0517   01/31/20 0300  ceFEPIme (MAXIPIME) 2 g in sodium chloride 0.9 % 100 mL IVPB        01/31/20 0146 01/31/20 0247   01/30/20 2300  ceFAZolin (ANCEF) IVPB 1 g/50 mL premix        01/30/20 2258 01/30/20 2332       Devices    LINES / TUBES:      Continuous Infusions:  ceFEPime (MAXIPIME) IV 2 g (02/01/20 0138)   vancomycin 1,500 mg (01/31/20 2113)     Objective: Vitals:   01/31/20 1415 01/31/20 1433 01/31/20 2018 02/01/20 0559  BP: 137/81  104/90 127/75  Pulse: (!) 113 (!) 109 (!) 110 (!) 105  Resp: 17  16 16   Temp: 98.8 F (37.1 C)  100.1 F  (37.8 C) 98.3 F (36.8 C)  TempSrc:   Oral Oral  SpO2: 99% 100% 100% 97%  Weight:      Height:        Intake/Output Summary (Last 24 hours) at 02/01/2020 0848 Last data filed at 02/01/2020 0559 Gross per 24 hour  Intake 2332.79 ml  Output 1400 ml  Net 932.79 ml   Filed Weights   01/30/20 2144  Weight: 134.7 kg   Physical Exam:  General: A/O x4 No acute respiratory distress Eyes: negative scleral hemorrhage, negative anisocoria, negative icterus ENT: Negative Runny nose, negative gingival bleeding, Neck:  Negative scars, masses, torticollis, lymphadenopathy, JVD Lungs: Clear to auscultation bilaterally without wheezes or crackles, erythematous area over right breast directly under where an EKG sticker occupied the area Cardiovascular: Regular rate and rhythm without murmur gallop or rub normal S1 and S2 Abdomen: MORBIDLY OBESE abdominal pain, nondistended, positive soft, bowel sounds, no rebound, no ascites, no appreciable mass Extremities: No significant cyanosis, clubbing.  Bilateral lower extremity swelling RIGHT>>> LEFT.  Painful when patient actively moves Skin: Negative rashes, lesions, ulcers Psychiatric:  Negative depression, negative anxiety, negative fatigue, negative mania  Central nervous system:  Cranial nerves II through XII intact, tongue/uvula midline, all extremities muscle strength 5/5, sensation intact throughout, negative dysarthria, negative expressive aphasia, negative receptive aphasia. .     Data Reviewed: Care during the described time interval was provided by me .  I have reviewed this patient's available data, including medical history, events of note, physical examination, and all test results as part of my evaluation.  CBC: Recent Labs  Lab 01/30/20 1538 01/30/20 2011 01/31/20 0329 02/01/20 0356  WBC CANCELED 20.4* 16.2* 13.6*  NEUTROABS  --  15.9* 11.3* 9.4*  HGB CANCELED 13.1 11.7* 12.3  HCT CANCELED 39.9 36.6 38.7  MCV  --  87.1 89.1  90.4  PLT CANCELED 367 313 329   Basic Metabolic Panel: Recent Labs  Lab 01/30/20 1538 01/30/20 2011 01/31/20 0329 01/31/20 0711 02/01/20 0356  NA 135 133* 134*  --  137  K 4.5 4.0 4.1  --  3.8  CL 100 100 102  --  104  CO2  --  23 22  --  22  GLUCOSE 85 118* 98  --  107*  BUN 11 14 13   --  16  CREATININE 0.57 0.54 0.54  --  0.60  CALCIUM 8.8 8.5* 8.1*  --  8.3*  MG  --   --   --   --  1.9  PHOS  --   --   --  4.1 4.9*  GFR: Estimated Creatinine Clearance: 156 mL/min (by C-G formula based on SCr of 0.6 mg/dL). Liver Function Tests: Recent Labs  Lab 01/30/20 1538 01/30/20 2011 01/31/20 0329 02/01/20 0356  AST 301* 270* 247* 205*  ALT  --  78* 70* 62*  ALKPHOS 53 53 36* 41  BILITOT 0.3 0.4 0.4 0.2*  PROT 6.0 6.3* 5.5* 5.3*  ALBUMIN 3.3* 2.9* 2.6* 2.4*   No results for input(s): LIPASE, AMYLASE in the last 168 hours. No results for input(s): AMMONIA in the last 168 hours. Coagulation Profile: Recent Labs  Lab 01/30/20 2011  INR 1.0   Cardiac Enzymes: Recent Labs  Lab 01/30/20 1538 01/30/20 2011 01/31/20 0329 02/01/20 0356  CKTOTAL 13,116* 11,283* 10,484* 9,011*   BNP (last 3 results) No results for input(s): PROBNP in the last 8760 hours. HbA1C: Recent Labs    01/30/20 1432  HGBA1C 5.4   CBG: No results for input(s): GLUCAP in the last 168 hours. Lipid Profile: No results for input(s): CHOL, HDL, LDLCALC, TRIG, CHOLHDL, LDLDIRECT in the last 72 hours. Thyroid Function Tests: No results for input(s): TSH, T4TOTAL, FREET4, T3FREE, THYROIDAB in the last 72 hours. Anemia Panel: No results for input(s): VITAMINB12, FOLATE, FERRITIN, TIBC, IRON, RETICCTPCT in the last 72 hours. Sepsis Labs: Recent Labs  Lab 01/30/20 2011 01/30/20 2144 01/31/20 0329 01/31/20 0711 01/31/20 2136 02/01/20 0356  PROCALCITON  --   --  <0.10  --  <0.10 <0.10  LATICACIDVEN 2.2* 1.8 1.3 1.3  --   --     Recent Results (from the past 240 hour(s))  Urine culture      Status: Abnormal   Collection Time: 01/30/20  8:12 PM   Specimen: In/Out Cath Urine  Result Value Ref Range Status   Specimen Description   Final    IN/OUT CATH URINE Performed at [facility], [address] [place], [place] [address]    Special Requests   Final    NONE Performed at [facility], [address], [place], [place] [address]    Culture MULTIPLE SPECIES PRESENT, SUGGEST RECOLLECTION (A)  Final   Report Status 02/01/2020 FINAL  Final  Wound or Superficial Culture     Status: None (Preliminary result)   Collection Time: 01/30/20  9:33 PM   Specimen: Abscess; Wound  Result Value Ref Range Status   Specimen Description   Final    ABSCESS Performed at [facility], [address] [place], [place] [address]    Special Requests   Final    NONE Performed at [facility], [address] [place], [place] [address]    Gram Stain   Final    NO WBC SEEN NO ORGANISMS SEEN Performed at [facility], [address], [place], [place] [address]    Culture PENDING  Incomplete   Report Status PENDING  Incomplete  Resp Panel by RT-PCR (Flu A&B, Covid) Nasopharyngeal Swab     Status: None   Collection Time: 01/30/20 11:08 PM   Specimen: Nasopharyngeal Swab; Nasopharyngeal(NP) swabs in vial transport medium  Result Value Ref Range Status   SARS Coronavirus 2 by RT PCR NEGATIVE NEGATIVE Final    Comment: (NOTE) SARS-CoV-2 target nucleic acids are NOT DETECTED.  The SARS-CoV-2 RNA is generally detectable in upper respiratory specimens during the acute phase of infection. The lowest concentration of SARS-CoV-2 viral copies this assay can detect is 138 copies/mL. A negative result does not preclude SARS-Cov-2 infection and should not  be used as the sole basis for treatment or other patient management decisions. A negative result may occur with  improper specimen collection/handling, submission of  specimen other than nasopharyngeal swab, presence of viral mutation(s) within the areas targeted by this assay, and inadequate number of viral copies(<138 copies/mL). A negative result must be combined with clinical observations, patient history, and epidemiological information. The expected result is Negative.  Fact Sheet for Patients:  BloggerCourse.com  Fact Sheet for Healthcare Providers:  SeriousBroker.it  This test is no t yet approved or cleared by the [place] FDA and  has been authorized for detection and/or diagnosis of SARS-CoV-2 by FDA under an Emergency Use Authorization (EUA). This EUA will remain  in effect (meaning this test can be used) for the duration of the COVID-19 declaration under Section 564(b)(1) of the Act, 21 U.S.C.section 360bbb-3(b)(1), unless the authorization is terminated  or revoked sooner.       Influenza A by PCR NEGATIVE NEGATIVE Final   Influenza B by PCR NEGATIVE NEGATIVE Final    Comment: (NOTE) The Xpert Xpress SARS-CoV-2/FLU/RSV plus assay is intended as an aid in the diagnosis of influenza from Nasopharyngeal swab specimens and should not be used as a sole basis for treatment. Nasal washings and aspirates are unacceptable for Xpert Xpress SARS-CoV-2/FLU/RSV testing.  Fact Sheet for Patients: BloggerCourse.com  Fact Sheet for Healthcare Providers: SeriousBroker.it  This test is not yet approved or cleared by the [place] FDA and has been authorized for detection and/or diagnosis of SARS-CoV-2 by FDA under an Emergency Use Authorization (EUA). This EUA will remain in effect (meaning this test can be used) for the duration of the COVID-19 declaration under Section 564(b)(1) of the Act, 21 U.S.C. section 360bbb-3(b)(1), unless the authorization is terminated or revoked.  Performed at [facility], [address] [place], [place] [address]   Culture, blood (routine x 2)     Status: None (Preliminary result)   Collection Time: 01/31/20  3:29 AM   Specimen: BLOOD  Result Value Ref Range Status   Specimen Description   Final    BLOOD RIGHT ANTECUBITAL Performed at [facility], [address] [place], [place] [address]    Special Requests   Final    BOTTLES DRAWN AEROBIC ONLY Blood Culture adequate volume Performed at [facility], [address], [place], [place] [address]    Culture   Final    NO GROWTH < 12 HOURS Performed at [facility], [address], [place], [place] [address]    Report Status PENDING  Incomplete  Culture, blood (routine x 2)     Status: None (Preliminary result)   Collection Time: 01/31/20  3:29 AM   Specimen: BLOOD  Result Value Ref Range Status   Specimen Description   Final    BLOOD BLOOD RIGHT FOREARM Performed at [facility], [address] [place], [place] [address]    Special Requests   Final    BOTTLES DRAWN AEROBIC ONLY Blood Culture adequate volume Performed at [facility], [address], [place], [place] [address]    Culture   Final    NO GROWTH < 12 HOURS Performed at [facility], [address], [place], [place] [address]    Report Status PENDING  Incomplete         Radiology Studies: CT PELVIS W CONTRAST  Result Date: 01/31/2020 CLINICAL DATA:  Anorectal abscess. EXAM: CT PELVIS  WITH CONTRAST TECHNIQUE: Multidetector CT imaging of the pelvis was performed using the standard protocol following the bolus administration of intravenous contrast. CONTRAST:  100mL OMNIPAQUE IOHEXOL 300 MG/ML  SOLN COMPARISON:  January 18, 2020 FINDINGS: Urinary Tract: There is circumferential wall thickening of the of the urinary bladder. Bowel: The visualized bowel is unremarkable. The appendix is unremarkable. Vascular/Lymphatic: There is no acute vascular  abnormality detected. There are mildly enlarged bilateral inguinal lymph nodes, right worse than left. Reproductive:  No mass or other significant abnormality Other: There are persistent diffuse inflammatory changes in the pelvis with a small amount of free fluid. This is similar but slightly improved from prior study. Musculoskeletal: There is diffuse intracompartmental edema within the proximal thighs. There may be some edema involving some of the musculature of the proximal thighs bilaterally. This appearance is near symmetric. There is a defect in the posterior subcutaneous soft tissues overlying the right gluteal muscle. In this region, there is fat induration without evidence for an abscess. There is nonspecific edema along the patient's flanks. IMPRESSION: 1. There is a defect in the posterior subcutaneous soft tissues overlying the right gluteal muscle. In this region, there is fat induration without evidence for an abscess. 2. There are persistent diffuse inflammatory changes in the pelvis with a small amount of free fluid. This is similar but slightly improved from prior study. 3. Circumferential wall thickening of the urinary bladder. Correlation with urinalysis is recommended to exclude cystitis. 4. Apparent intracompartmental edema involving the proximal thighs bilaterally with possible intramuscular edema bilaterally is nonspecific but can be seen in patients with rhabdomyolysis. Correlation with laboratory studies, specifically CK, is recommended. Electronically Signed   By: Katherine Mantlehristopher  Green M.D.   On: 01/31/2020 03:27   MR PELVIS WO CONTRAST  Result Date: 01/31/2020 CLINICAL DATA:  Severe leg swelling and markedly elevated CK levels. EXAM: MRI pelvis, MRI OF THE RIGHT FEMUR WITHOUT CONTRAST TECHNIQUE: Multiplanar, multisequence MR imaging of the pelvis and right femur was performed. No intravenous contrast was administered. COMPARISON:  CT scan 01/31/2020 FINDINGS: Severe diffuse inflammatory  process involving the paraspinal, rectus, pelvic, hip and thigh musculature. All of these muscles are markedly swollen and edematous with diffuse increased T2 signal intensity which is quite symmetric. There is also mild subcutaneous soft tissue swelling/edema most notably in the right buttock area where there is an open wound. No underlying abscess is identified. No obvious findings for pyomyositis without contrast. Diffuse inflammatory changes, edema and fluid involving the intrapelvic structures without obvious abscess. The bony structures are unremarkable. No findings suspicious for septic arthritis or osteomyelitis. IMPRESSION: 1. Severe diffuse myositis. Findings could be due to post infectious myositis, paraneoplastic process, autoimmune disorders and drug reactions. 2. Open wound in the right buttock area. No abscess. 3. No findings for septic arthritis or osteomyelitis. Electronically Signed   By: Rudie MeyerP.  Gallerani M.D.   On: 01/31/2020 12:46   MR ZOXWRFRMUR RIGHT WO CONTRAST  Result Date: 01/31/2020 CLINICAL DATA:  Severe leg swelling and markedly elevated CK levels. EXAM: MRI pelvis, MRI OF THE RIGHT FEMUR WITHOUT CONTRAST TECHNIQUE: Multiplanar, multisequence MR imaging of the pelvis and right femur was performed. No intravenous contrast was administered. COMPARISON:  CT scan 01/31/2020 FINDINGS: Severe diffuse inflammatory process involving the paraspinal, rectus, pelvic, hip and thigh musculature. All of these muscles are markedly swollen and edematous with diffuse increased T2 signal intensity which is quite symmetric. There is also mild subcutaneous soft tissue swelling/edema most notably in the right buttock area where  there is an open wound. No underlying abscess is identified. No obvious findings for pyomyositis without contrast. Diffuse inflammatory changes, edema and fluid involving the intrapelvic structures without obvious abscess. The bony structures are unremarkable. No findings suspicious for  septic arthritis or osteomyelitis. IMPRESSION: 1. Severe diffuse myositis. Findings could be due to post infectious myositis, paraneoplastic process, autoimmune disorders and drug reactions. 2. Open wound in the right buttock area. No abscess. 3. No findings for septic arthritis or osteomyelitis. Electronically Signed   By: Rudie Meyer M.D.   On: 01/31/2020 12:46   US PELVIC COMPLETE W TRANSVAGINAL AND TORSION R/O  Result Date: 01/31/2020 CLINICAL DATA:  Abdominal pain. EXAM: TRANSABDOMINAL AND TRANSVAGINAL ULTRASOUND OF PELVIS TECHNIQUE: Both transabdominal and transvaginal ultrasound examinations of the pelvis were performed. Transabdominal technique was performed for global imaging of the pelvis including uterus, ovaries, adnexal regions, and pelvic cul-de-sac. It was necessary to proceed with endovaginal exam following the transabdominal exam to visualize the endometrium and ovaries. COMPARISON:  CT scan of same day. FINDINGS: Uterus Measurements: 7.8 x 4.1 x 3.5 cm = volume: 58 mL. No fibroids or other mass visualized. Endometrium Thickness: 6 mm which is within normal limits. No focal abnormality visualized. Right ovary Not visualized. Left ovary Not visualized. Other findings Small amount of free fluid is noted which most likely is physiologic. IMPRESSION: Uterus and endometrium are unremarkable. Ovaries are not visualized. No definite adnexal abnormality is noted. Electronically Signed   By: Lupita Raider M.D.   On: 01/31/2020 16:25        Scheduled Meds:  Continuous Infusions:  ceFEPime (MAXIPIME) IV 2 g (02/01/20 0138)   vancomycin 1,500 mg (01/31/20 2113)     LOS: 1 day    Time spent:40 min    Ayjah Show, Roselind Messier, MD [facility] Pager [phone]  If 7PM-7AM, please contact night-coverage [email or web address] Password [facility] 02/01/2020, 8:48 AM

## 2020-02-02 LAB — CBC WITH DIFFERENTIAL/PLATELET
Abs Immature Granulocytes: 0.68 10*3/uL — ABNORMAL HIGH (ref 0.00–0.07)
Basophils Absolute: 0.1 10*3/uL (ref 0.0–0.1)
Basophils Relative: 1 %
Eosinophils Absolute: 0.1 10*3/uL (ref 0.0–0.5)
Eosinophils Relative: 1 %
HCT: 37.8 % (ref 36.0–46.0)
Hemoglobin: 12.2 g/dL (ref 12.0–15.0)
Immature Granulocytes: 4 %
Lymphocytes Relative: 10 %
Lymphs Abs: 1.6 10*3/uL (ref 0.7–4.0)
MCH: 28.6 pg (ref 26.0–34.0)
MCHC: 32.3 g/dL (ref 30.0–36.0)
MCV: 88.5 fL (ref 80.0–100.0)
Monocytes Absolute: 1.5 10*3/uL — ABNORMAL HIGH (ref 0.1–1.0)
Monocytes Relative: 9 %
Neutro Abs: 12 10*3/uL — ABNORMAL HIGH (ref 1.7–7.7)
Neutrophils Relative %: 75 %
Platelets: 353 10*3/uL (ref 150–400)
RBC: 4.27 MIL/uL (ref 3.87–5.11)
RDW: 15.9 % — ABNORMAL HIGH (ref 11.5–15.5)
WBC: 15.9 10*3/uL — ABNORMAL HIGH (ref 4.0–10.5)
nRBC: 0 % (ref 0.0–0.2)

## 2020-02-02 LAB — COMPREHENSIVE METABOLIC PANEL
ALT: 53 U/L — ABNORMAL HIGH (ref 0–44)
AST: 162 U/L — ABNORMAL HIGH (ref 15–41)
Albumin: 2.4 g/dL — ABNORMAL LOW (ref 3.5–5.0)
Alkaline Phosphatase: 36 U/L — ABNORMAL LOW (ref 38–126)
Anion gap: 11 (ref 5–15)
BUN: 13 mg/dL (ref 6–20)
CO2: 19 mmol/L — ABNORMAL LOW (ref 22–32)
Calcium: 8.2 mg/dL — ABNORMAL LOW (ref 8.9–10.3)
Chloride: 103 mmol/L (ref 98–111)
Creatinine, Ser: 0.47 mg/dL (ref 0.44–1.00)
GFR, Estimated: >60 mL/min (ref >60)
Glucose, Bld: 92 mg/dL (ref 70–99)
Potassium: 3.7 mmol/L (ref 3.5–5.1)
Sodium: 133 mmol/L — ABNORMAL LOW (ref 135–145)
Total Bilirubin: 0.7 mg/dL (ref 0.3–1.2)
Total Protein: 5.4 g/dL — ABNORMAL LOW (ref 6.5–8.1)

## 2020-02-02 LAB — AEROBIC CULTURE W GRAM STAIN (SUPERFICIAL SPECIMEN)
Culture: NORMAL
Gram Stain: NONE SEEN

## 2020-02-02 LAB — GC/CHLAMYDIA PROBE AMP (~~LOC~~) NOT AT ARMC
Chlamydia: NEGATIVE
Comment: NEGATIVE
Comment: NORMAL
Neisseria Gonorrhea: NEGATIVE

## 2020-02-02 LAB — CK: Total CK: 5722 U/L — ABNORMAL HIGH (ref 38–234)

## 2020-02-02 LAB — MAGNESIUM: Magnesium: 2 mg/dL (ref 1.7–2.4)

## 2020-02-02 LAB — LACTATE DEHYDROGENASE: LDH: 459 U/L — ABNORMAL HIGH (ref 98–192)

## 2020-02-02 LAB — PHOSPHORUS: Phosphorus: 3.5 mg/dL (ref 2.5–4.6)

## 2020-02-02 MED ORDER — SODIUM CHLORIDE 0.9 % IV SOLN
500.0000 mg | Freq: Every day | INTRAVENOUS | Status: DC
  Administered 2020-02-02 – 2020-02-08 (×7): 500 mg via INTRAVENOUS
  Filled 2020-02-02 (×7): qty 500

## 2020-02-02 MED ORDER — SODIUM CHLORIDE 0.9 % IV SOLN
2.0000 g | INTRAVENOUS | Status: DC
  Administered 2020-02-02 – 2020-02-09 (×8): 2 g via INTRAVENOUS
  Filled 2020-02-02: qty 2
  Filled 2020-02-02: qty 20
  Filled 2020-02-02 (×4): qty 2
  Filled 2020-02-02: qty 20
  Filled 2020-02-02: qty 2

## 2020-02-02 MED ORDER — DOXYCYCLINE HYCLATE 100 MG PO TABS
100.0000 mg | ORAL_TABLET | Freq: Two times a day (BID) | ORAL | Status: DC
  Filled 2020-02-02: qty 1

## 2020-02-02 MED ORDER — CHLORHEXIDINE GLUCONATE CLOTH 2 % EX PADS
6.0000 | MEDICATED_PAD | Freq: Every day | CUTANEOUS | Status: DC
  Administered 2020-02-02 – 2020-02-15 (×13): 6 via TOPICAL

## 2020-02-02 MED ORDER — METRONIDAZOLE 500 MG PO TABS
500.0000 mg | ORAL_TABLET | Freq: Three times a day (TID) | ORAL | Status: DC
  Administered 2020-02-02 – 2020-02-13 (×33): 500 mg via ORAL
  Filled 2020-02-02 (×33): qty 1

## 2020-02-02 MED ORDER — SODIUM CHLORIDE 0.9% FLUSH
10.0000 mL | INTRAVENOUS | Status: DC | PRN
  Administered 2020-02-07 – 2020-02-14 (×2): 10 mL

## 2020-02-02 NOTE — Progress Notes (Signed)
PROGRESS NOTE    Haley Sandoval  OFB:[id number] DOB: 01-29-2001 DOA: 01/30/2020 PCP: Barbette Merino, NP     Brief Narrative:  19 y.o. BF PMHx obesity class III, rhabdomyolysis, PID, recent admission at [facility] for rhabdomyolysis and possible pelvic inflammatory disease initially CK levels at that time was found to be 1 31,000 LFTs were elevated.  Gastroenterology was consulted at that time and felt that patient's elevated LFTs were likely from rhabdomyolysis.  CT abdomen pelvis done showed features concerning for pelvic inflammatory fat stranding which may represent sequela of mild mesenteritis versus pelvic inflammatory disease patient was empirically treated with ceftriaxone doxycycline metronidazole.  Subsequent which patient also started on steroids for the rhabdomyolysis and rhabdomyolysis trended down and at the time of discharge was around 13,000.    Patient continued to have pain and noticed some increasing swelling of the right buttock area concerning for abscess and presented to the ER today.  Patient states the pain has been worsening particular in the lower extremities and upper extremities.  Denies any nausea vomiting or diarrhea.  Has been having some subjective feeling of fever chills.  Patient states she has been taking the tapering dose of steroids.  Denies any vaginal discharge.  ED Course: In the ER patient was found to have a small abscess to right buttock which was drained by the ER physician and started on empiric antibiotics.  On my exam patient has significant tenderness in the thigh areas.  Patient also complains of swelling in the upper extremities.  Abdomen is nontender.  Labs are significant for CK level of 11,283 with lactic acid of 2.2 which improved with fluids to 1.8 WBC count of 20.8 LFTs are showing a downtrend of AST of 270 ALT of 78.  Covid test is negative.  Pregnant screen is negative.  I have ordered a repeat CT pelvis.    Subjective: 12/13  afebrile overnight (Elevated temp 37.8 C), A/O x4, states she is tired of being stuck and is waiting for PICC line to be placed.   Per patient in March had small rash on her right buttocks.  Then in November rash began to enlarge and become painful, at that point patient also began to have pain in her lower extremities whenever she walked up steps or rose from a chair.  In addition also began to have pain in bilateral upper extremities whenever she reached over her head for objects.  No other rashes on her body.  No family history of autoimmune diseases.  States only took 3 days of the steroids prescribed to her at [facility].     Assessment & Plan: Covid vaccination;   Principal Problem:   Sepsis (HCC) Active Problems:   Rhabdomyolysis   Elevated liver enzymes   Obesity, Class III, BMI 40-49.9 (morbid obesity) (HCC)   Cellulitis   Morbidly obese (HCC)  Severe sepsis/PID -On admission meets criteria for sepsis HR> 90,> 12 K, lactic acid> 2 multiple points of infection to include abscess on buttocks, PID confirmed on CT scan. -Continue current antibiotics -Lactated Ringer's 29ml/hr -Trend lactic acid/procalcitonin Results for GERALDINA, PARROTT (MRN [id number]) as of 02/01/2020 16:50  Ref. Range 01/30/2020 20:11 01/30/2020 21:44 01/31/2020 03:29 01/31/2020 07:11  Lactic Acid, Venous Latest Ref Range: 0.5 - 1.9 mmol/L 2.2 (HH) 1.8 1.3 1.3   Results for AMBERLEA, SPAGNUOLO (MRN [id number]) as of 02/01/2020 16:50  Ref. Range 01/31/2020 03:29 01/31/2020 21:36 02/01/2020 03:56  Procalcitonin Latest Units: ng/mL <0.10 <0.10 <  0.10   Rhabdomyolysis -See severe sepsis -Trend CK  Lab Results  Component Value Date   CKTOTAL 9,011 (H) 02/01/2020   CKTOTAL 10,484 (H) 01/31/2020   CKTOTAL 11,283 (H) 01/30/2020   CKTOTAL 13,116 (HH) 01/30/2020   CKTOTAL 12,199 (H) 01/22/2020    Myositis -Patient signs and symptoms are consistent with a myositis.  Patient only took 3 days of  steroids. -Patient requires muscle biopsy however would not be safe until sepsis and rhabdomyolysis resolved. -No family history of autoimmune disease. -12/12 send off myositis panel -12/13 will contact surgery although I expect they will not want to perform biopsy for at least 5 or 6 days.  Elevated liver enzymes -Most likely secondary to rhabdomyolysis. -Acute hepatitis panel pending  Morbidly obese -Once patient stable and discharged would benefit from weight loss program.   12/11 addendum -patient CT pelvis shows persistent pelvic inflammatory disease with some improvement.  I discussed with Dr. Vergie Living on-call OB/GYN who advised me to get transvaginal ultrasound also chlamydia and gonorrhea probe.  To reconsult them in the morning after getting the transvaginal ultrasound results. -GC probe pending -Transvaginal ultrasound completed but not read  DVT prophylaxis: Lovenox Code Status: Full Family Communication: 12/13 boyfriend at bedside for discussion of plan of care answered all questions Status is: Inpatient    Dispo: The patient is from:               Anticipated d/c is to: Home              Anticipated d/c date is: 12/20              Patient currently unstable      Consultants:  12/10 RIGHT buttocks abscess reintubated for better growth 12/11 RIGHT AC NGTD 12/11 RIGHT forearm NGTD  12/11 RPR negative 12/11 Chlamydia/gonorrhea pending 12/12 acute hepatitis panel negative    Procedures/Significant Events:  12/11 CT pelvis W contrast;-defect in the posterior subcutaneous soft tissues overlying the right gluteal muscle. In this region, there is fat  induration without evidence for an abscess. -persistent diffuse inflammatory changes in the pelvis with a small amount of free fluid. This is similar but slightly improved from prior study. -Circumferential wall thickening of the urinary bladder. Correlation with urinalysis is recommended to exclude cystitis. -Apparent  intracompartmental edema involving the proximal thighs bilaterally with possible intramuscular edema bilaterally is nonspecific but can be seen in patients with rhabdomyolysis. Correlation with laboratory studies, specifically CK, is recommended. 12/11 MRI RIGHT femur/pelvis W0 contrast;Severe diffuse myositis. Findings could be due to post infectious myositis, paraneoplastic process, autoimmune disorders and drug reactions. 2. Open wound in the right buttock area. No abscess. 3. No findings for septic arthritis or osteomyelitis. 12/11 transvaginal ultrasound;Uterus and endometrium are unremarkable. Ovaries are not visualized. No definite adnexal abnormality is noted 12/12 PICC line pending      I have personally reviewed and interpreted all radiology studies and my findings are as above.  VENTILATOR SETTINGS:    Cultures 12/10 SARS coronavirus negative 12/10 influenza A/B negative 12/10 urine pending 12/10 abscess of buttock NGTD 12/11 blood pending    Antimicrobials: Anti-infectives (From admission, onward)   Start     Ordered Stop   01/31/20 1200  vancomycin (VANCOREADY) IVPB 1250 mg/250 mL        01/31/20 0230     01/31/20 1000  ceFEPIme (MAXIPIME) 2 g in sodium chloride 0.9 % 100 mL IVPB        01/31/20 0226  01/31/20 0330  vancomycin (VANCOREADY) IVPB 2000 mg/400 mL        01/31/20 0146 01/31/20 0517   01/31/20 0300  ceFEPIme (MAXIPIME) 2 g in sodium chloride 0.9 % 100 mL IVPB        01/31/20 0146 01/31/20 0247   01/30/20 2300  ceFAZolin (ANCEF) IVPB 1 g/50 mL premix        01/30/20 2258 01/30/20 2332       Devices    LINES / TUBES:      Continuous Infusions: . ceFEPime (MAXIPIME) IV 2 g (02/02/20 0810)  . lactated ringers 150 mL/hr at 02/02/20 0650  . vancomycin Stopped (02/02/20 0021)     Objective: Vitals:   02/01/20 0559 02/01/20 1407 02/01/20 2130 02/02/20 0500  BP: 127/75 (!) 158/78 (!) 116/54 (!) 111/52  Pulse: (!) 105 (!) 108 (!) 110  (!) 110  Resp: 16 18 20 19   Temp: 98.3 F (36.8 C) 98 F (36.7 C) 98.3 F (36.8 C) 100 F (37.8 C)  TempSrc: Oral Oral Oral Oral  SpO2: 97% 100% 100% 99%  Weight:      Height:        Intake/Output Summary (Last 24 hours) at 02/02/2020 0857 Last data filed at 02/02/2020 1610 Gross per 24 hour  Intake 4322.18 ml  Output 1100 ml  Net 3222.18 ml   Filed Weights   01/30/20 2144  Weight: 134.7 kg   Physical Exam:  General: A/O x4 No acute respiratory distress Eyes: negative scleral hemorrhage, negative anisocoria, negative icterus ENT: Negative Runny nose, negative gingival bleeding, Neck:  Negative scars, masses, torticollis, lymphadenopathy, JVD Lungs: Clear to auscultation bilaterally without wheezes or crackles, erythematous area over right breast directly under where an EKG sticker occupied the area Cardiovascular: Regular rate and rhythm without murmur gallop or rub normal S1 and S2 Abdomen: MORBIDLY OBESE abdominal pain, nondistended, positive soft, bowel sounds, no rebound, no ascites, no appreciable mass Extremities: No significant cyanosis, clubbing.  Bilateral lower extremity swelling RIGHT>>> LEFT.  Painful when patient actively moves Skin: Negative rashes, lesions, ulcers Psychiatric:  Negative depression, negative anxiety, negative fatigue, negative mania  Central nervous system:  Cranial nerves II through XII intact, tongue/uvula midline, all extremities muscle strength 5/5, sensation intact throughout, negative dysarthria, negative expressive aphasia, negative receptive aphasia. .     Data Reviewed: Care during the described time interval was provided by me .  I have reviewed this patient's available data, including medical history, events of note, physical examination, and all test results as part of my evaluation.  CBC: Recent Labs  Lab 01/30/20 1538 01/30/20 2011 01/31/20 0329 02/01/20 0356  WBC CANCELED 20.4* 16.2* 13.6*  NEUTROABS  --  15.9* 11.3*  9.4*  HGB CANCELED 13.1 11.7* 12.3  HCT CANCELED 39.9 36.6 38.7  MCV  --  87.1 89.1 90.4  PLT CANCELED 367 313 329   Basic Metabolic Panel: Recent Labs  Lab 01/30/20 1538 01/30/20 2011 01/31/20 0329 01/31/20 0711 02/01/20 0356  NA 135 133* 134*  --  137  K 4.5 4.0 4.1  --  3.8  CL 100 100 102  --  104  CO2  --  23 22  --  22  GLUCOSE 85 118* 98  --  107*  BUN 11 14 13   --  16  CREATININE 0.57 0.54 0.54  --  0.60  CALCIUM 8.8 8.5* 8.1*  --  8.3*  MG  --   --   --   --  1.9  PHOS  --   --   --  4.1 4.9*   GFR: Estimated Creatinine Clearance: 156 mL/min (by C-G formula based on SCr of 0.6 mg/dL). Liver Function Tests: Recent Labs  Lab 01/30/20 1538 01/30/20 2011 01/31/20 0329 02/01/20 0356  AST 301* 270* 247* 205*  ALT  --  78* 70* 62*  ALKPHOS 53 53 36* 41  BILITOT 0.3 0.4 0.4 0.2*  PROT 6.0 6.3* 5.5* 5.3*  ALBUMIN 3.3* 2.9* 2.6* 2.4*   No results for input(s): LIPASE, AMYLASE in the last 168 hours. No results for input(s): AMMONIA in the last 168 hours. Coagulation Profile: Recent Labs  Lab 01/30/20 2011  INR 1.0   Cardiac Enzymes: Recent Labs  Lab 01/30/20 1538 01/30/20 2011 01/31/20 0329 02/01/20 0356  CKTOTAL 13,116* 11,283* 10,484* 9,011*   BNP (last 3 results) No results for input(s): PROBNP in the last 8760 hours. HbA1C: Recent Labs    01/30/20 1432  HGBA1C 5.4   CBG: No results for input(s): GLUCAP in the last 168 hours. Lipid Profile: No results for input(s): CHOL, HDL, LDLCALC, TRIG, CHOLHDL, LDLDIRECT in the last 72 hours. Thyroid Function Tests: No results for input(s): TSH, T4TOTAL, FREET4, T3FREE, THYROIDAB in the last 72 hours. Anemia Panel: No results for input(s): VITAMINB12, FOLATE, FERRITIN, TIBC, IRON, RETICCTPCT in the last 72 hours. Sepsis Labs: Recent Labs  Lab 01/30/20 2011 01/30/20 2144 01/31/20 0329 01/31/20 0711 01/31/20 2136 02/01/20 0356  PROCALCITON  --   --  <0.10  --  <0.10 <0.10  LATICACIDVEN 2.2* 1.8  1.3 1.3  --   --     Recent Results (from the past 240 hour(s))  Urine culture     Status: Abnormal   Collection Time: 01/30/20  8:12 PM   Specimen: In/Out Cath Urine  Result Value Ref Range Status   Specimen Description   Final    IN/OUT CATH URINE Performed at [facility], [address] [place], [place] [address]    Special Requests   Final    NONE Performed at [facility], [address] [place], [place] [address]    Culture MULTIPLE SPECIES PRESENT, SUGGEST RECOLLECTION (A)  Final   Report Status 02/01/2020 FINAL  Final  Wound or Superficial Culture     Status: None (Preliminary result)   Collection Time: 01/30/20  9:33 PM   Specimen: Abscess; Wound  Result Value Ref Range Status   Specimen Description   Final    ABSCESS Performed at [facility], [address] [place], [place] [address]    Special Requests   Final    NONE Performed at [facility], [address] [place], [place] [address]    Gram Stain NO WBC SEEN NO ORGANISMS SEEN   Final   Culture   Final    CULTURE REINCUBATED FOR BETTER GROWTH Performed at [facility], [address] [place], [place] [address]    Report Status PENDING  Incomplete  Resp Panel by RT-PCR (Flu A&B, Covid) Nasopharyngeal Swab     Status: None   Collection Time: 01/30/20 11:08 PM   Specimen: Nasopharyngeal Swab; Nasopharyngeal(NP) swabs in vial transport medium  Result Value Ref Range Status   SARS Coronavirus 2 by RT PCR NEGATIVE NEGATIVE Final    Comment: (NOTE) SARS-CoV-2 target nucleic acids are NOT DETECTED.  The SARS-CoV-2 RNA is generally detectable in upper respiratory specimens during the acute phase of infection. The lowest concentration of SARS-CoV-2 viral  copies this assay can detect is 138 copies/mL. A negative result does not preclude SARS-Cov-2 infection and should not be used as the sole basis for treatment or other  patient management decisions. A negative result may occur with  improper specimen collection/handling, submission of specimen other than nasopharyngeal swab, presence of viral mutation(s) within the areas targeted by this assay, and inadequate number of viral copies(<138 copies/mL). A negative result must be combined with clinical observations, patient history, and epidemiological information. The expected result is Negative.  Fact Sheet for Patients:  BloggerCourse.com  Fact Sheet for Healthcare Providers:  SeriousBroker.it  This test is no t yet approved or cleared by the [place] FDA and  has been authorized for detection and/or diagnosis of SARS-CoV-2 by FDA under an Emergency Use Authorization (EUA). This EUA will remain  in effect (meaning this test can be used) for the duration of the COVID-19 declaration under Section 564(b)(1) of the Act, 21 U.S.C.section 360bbb-3(b)(1), unless the authorization is terminated  or revoked sooner.       Influenza A by PCR NEGATIVE NEGATIVE Final   Influenza B by PCR NEGATIVE NEGATIVE Final    Comment: (NOTE) The Xpert Xpress SARS-CoV-2/FLU/RSV plus assay is intended as an aid in the diagnosis of influenza from Nasopharyngeal swab specimens and should not be used as a sole basis for treatment. Nasal washings and aspirates are unacceptable for Xpert Xpress SARS-CoV-2/FLU/RSV testing.  Fact Sheet for Patients: BloggerCourse.com  Fact Sheet for Healthcare Providers: SeriousBroker.it  This test is not yet approved or cleared by the [place] FDA and has been authorized for detection and/or diagnosis of SARS-CoV-2 by FDA under an Emergency Use Authorization (EUA). This EUA will remain in effect (meaning this test can be used) for the duration of the COVID-19 declaration under Section 564(b)(1) of the Act, 21 U.S.C. section  360bbb-3(b)(1), unless the authorization is terminated or revoked.  Performed at [facility], [address] [place], [place] [address]   Culture, blood (routine x 2)     Status: None (Preliminary result)   Collection Time: 01/31/20  3:29 AM   Specimen: BLOOD  Result Value Ref Range Status   Specimen Description   Final    BLOOD RIGHT ANTECUBITAL Performed at [facility], [address] [place], [place] [address]    Special Requests   Final    BOTTLES DRAWN AEROBIC ONLY Blood Culture adequate volume Performed at [facility], [address], [place], [place] [address]    Culture   Final    NO GROWTH 1 DAY Performed at [facility], [address] [place], [place] [address]    Report Status PENDING  Incomplete  Culture, blood (routine x 2)     Status: None (Preliminary result)   Collection Time: 01/31/20  3:29 AM   Specimen: BLOOD  Result Value Ref Range Status   Specimen Description   Final    BLOOD BLOOD RIGHT FOREARM Performed at [facility], [address], [place], [place] [address]    Special Requests   Final    BOTTLES DRAWN AEROBIC ONLY Blood Culture adequate volume Performed at [facility], [address] [place], [place] [address]    Culture   Final    NO GROWTH 1 DAY Performed at [facility], [address] [place], [place] [address]    Report Status PENDING  Incomplete         Radiology  Studies: MR PELVIS WO CONTRAST  Addendum Date: 02/02/2020   ADDENDUM REPORT: 02/02/2020 07:58 ADDENDUM: The uterus and ovaries are normal. Electronically Signed   By: Rudie Meyer M.D.   On: 02/02/2020 07:58   Result Date: 02/02/2020 CLINICAL DATA:  Severe leg swelling and markedly elevated CK levels. EXAM: MRI pelvis, MRI OF THE RIGHT FEMUR WITHOUT CONTRAST TECHNIQUE: Multiplanar, multisequence MR imaging of the pelvis and right femur was performed. No  intravenous contrast was administered. COMPARISON:  CT scan 01/31/2020 FINDINGS: Severe diffuse inflammatory process involving the paraspinal, rectus, pelvic, hip and thigh musculature. All of these muscles are markedly swollen and edematous with diffuse increased T2 signal intensity which is quite symmetric. There is also mild subcutaneous soft tissue swelling/edema most notably in the right buttock area where there is an open wound. No underlying abscess is identified. No obvious findings for pyomyositis without contrast. Diffuse inflammatory changes, edema and fluid involving the intrapelvic structures without obvious abscess. The bony structures are unremarkable. No findings suspicious for septic arthritis or osteomyelitis. IMPRESSION: 1. Severe diffuse myositis. Findings could be due to post infectious myositis, paraneoplastic process, autoimmune disorders and drug reactions. 2. Open wound in the right buttock area. No abscess. 3. No findings for septic arthritis or osteomyelitis. Electronically Signed: By: Rudie Meyer M.D. On: 01/31/2020 12:46   MR [id number] RIGHT WO CONTRAST  Addendum Date: 02/02/2020   ADDENDUM REPORT: 02/02/2020 07:58 ADDENDUM: The uterus and ovaries are normal. Electronically Signed   By: Rudie Meyer M.D.   On: 02/02/2020 07:58   Result Date: 02/02/2020 CLINICAL DATA:  Severe leg swelling and markedly elevated CK levels. EXAM: MRI pelvis, MRI OF THE RIGHT FEMUR WITHOUT CONTRAST TECHNIQUE: Multiplanar, multisequence MR imaging of the pelvis and right femur was performed. No intravenous contrast was administered. COMPARISON:  CT scan 01/31/2020 FINDINGS: Severe diffuse inflammatory process involving the paraspinal, rectus, pelvic, hip and thigh musculature. All of these muscles are markedly swollen and edematous with diffuse increased T2 signal intensity which is quite symmetric. There is also mild subcutaneous soft tissue swelling/edema most notably in the right buttock area where  there is an open wound. No underlying abscess is identified. No obvious findings for pyomyositis without contrast. Diffuse inflammatory changes, edema and fluid involving the intrapelvic structures without obvious abscess. The bony structures are unremarkable. No findings suspicious for septic arthritis or osteomyelitis. IMPRESSION: 1. Severe diffuse myositis. Findings could be due to post infectious myositis, paraneoplastic process, autoimmune disorders and drug reactions. 2. Open wound in the right buttock area. No abscess. 3. No findings for septic arthritis or osteomyelitis. Electronically Signed: By: Rudie Meyer M.D. On: 01/31/2020 12:46   US PELVIC COMPLETE W TRANSVAGINAL AND TORSION R/O  Result Date: 01/31/2020 CLINICAL DATA:  Abdominal pain. EXAM: TRANSABDOMINAL AND TRANSVAGINAL ULTRASOUND OF PELVIS TECHNIQUE: Both transabdominal and transvaginal ultrasound examinations of the pelvis were performed. Transabdominal technique was performed for global imaging of the pelvis including uterus, ovaries, adnexal regions, and pelvic cul-de-sac. It was necessary to proceed with endovaginal exam following the transabdominal exam to visualize the endometrium and ovaries. COMPARISON:  CT scan of same day. FINDINGS: Uterus Measurements: 7.8 x 4.1 x 3.5 cm = volume: 58 mL. No fibroids or other mass visualized. Endometrium Thickness: 6 mm which is within normal limits. No focal abnormality visualized. Right ovary Not visualized. Left ovary Not visualized. Other findings Small amount of free fluid is noted which most likely is physiologic. IMPRESSION: Uterus and endometrium are unremarkable. Ovaries are not visualized. No  definite adnexal abnormality is noted. Electronically Signed   By: Lupita Raider M.D.   On: 01/31/2020 16:25   [place] EKG SITE RITE  Result Date: 02/01/2020 If Site Rite image not attached, placement could not be confirmed due to current cardiac rhythm.       Scheduled Meds: . oxyCODONE   10 mg Oral Q12H   Continuous Infusions: . ceFEPime (MAXIPIME) IV 2 g (02/02/20 0810)  . lactated ringers 150 mL/hr at 02/02/20 0650  . vancomycin Stopped (02/02/20 0021)     LOS: 2 days    Time spent:40 min    Danielle Lento, Roselind Messier, MD [facility] Pager [phone]  If 7PM-7AM, please contact night-coverage [email or web address] Password [facility] 02/02/2020, 8:57 AM

## 2020-02-02 NOTE — Consult Note (Signed)
GYN Note I spoke to Dr. Pecolia Ades today and GYN structures are normal, no e/o arcuate uterus. No e/o GYN etiology for presentation. If GC/CT swab comes back positive, please contact us. GYN signing off.  Cornelia Copa MD Attending Center for [employer] (Faculty Practice) 02/02/2020 Time: [id number]

## 2020-02-02 NOTE — Progress Notes (Signed)
Peripherally Inserted Central Catheter Placement  The IV Nurse has discussed with the patient and/or persons authorized to consent for the patient, the purpose of this procedure and the potential benefits and risks involved with this procedure.  The benefits include less needle sticks, lab draws from the catheter, and the patient may be discharged home with the catheter. Risks include, but not limited to, infection, bleeding, blood clot (thrombus formation), and puncture of an artery; nerve damage and irregular heartbeat and possibility to perform a PICC exchange if needed/ordered by physician.  Alternatives to this procedure were also discussed.  Bard Power PICC patient education guide, fact sheet on infection prevention and patient information card has been provided to patient /or left at bedside.    PICC Placement Documentation  PICC Single Lumen 02/02/20 PICC Right Cephalic 45 cm 2 cm (Active)  Indication for Insertion or Continuance of Line Poor Vasculature-patient has had multiple peripheral attempts or PIVs lasting less than 24 hours 02/02/20 1644  Exposed Catheter (cm) 2 cm 02/02/20 1644  Site Assessment Clean;Dry;Intact 02/02/20 1644  Line Status Flushed;Saline locked;Blood return noted 02/02/20 1644  Dressing Type Transparent;Securing device 02/02/20 1644  Dressing Status Clean;Dry;Intact 02/02/20 1644  Antimicrobial disc in place? Yes 02/02/20 1644  Safety Lock Not Applicable 02/02/20 1644  Dressing Change Due 02/09/20 02/02/20 1644       Romie Jumper 02/02/2020, 4:47 PM

## 2020-02-03 LAB — CBC WITH DIFFERENTIAL/PLATELET
Abs Immature Granulocytes: 0.74 10*3/uL — ABNORMAL HIGH (ref 0.00–0.07)
Basophils Absolute: 0.1 10*3/uL (ref 0.0–0.1)
Basophils Relative: 1 %
Eosinophils Absolute: 0.1 10*3/uL (ref 0.0–0.5)
Eosinophils Relative: 1 %
HCT: 34.6 % — ABNORMAL LOW (ref 36.0–46.0)
Hemoglobin: 11.2 g/dL — ABNORMAL LOW (ref 12.0–15.0)
Immature Granulocytes: 5 %
Lymphocytes Relative: 10 %
Lymphs Abs: 1.6 10*3/uL (ref 0.7–4.0)
MCH: 28.4 pg (ref 26.0–34.0)
MCHC: 32.4 g/dL (ref 30.0–36.0)
MCV: 87.8 fL (ref 80.0–100.0)
Monocytes Absolute: 1.5 10*3/uL — ABNORMAL HIGH (ref 0.1–1.0)
Monocytes Relative: 10 %
Neutro Abs: 11.6 10*3/uL — ABNORMAL HIGH (ref 1.7–7.7)
Neutrophils Relative %: 73 %
Platelets: 306 10*3/uL (ref 150–400)
RBC: 3.94 MIL/uL (ref 3.87–5.11)
RDW: 15.8 % — ABNORMAL HIGH (ref 11.5–15.5)
WBC: 15.7 10*3/uL — ABNORMAL HIGH (ref 4.0–10.5)
nRBC: 0 % (ref 0.0–0.2)

## 2020-02-03 LAB — COMPREHENSIVE METABOLIC PANEL
ALT: 48 U/L — ABNORMAL HIGH (ref 0–44)
AST: 152 U/L — ABNORMAL HIGH (ref 15–41)
Albumin: 2.3 g/dL — ABNORMAL LOW (ref 3.5–5.0)
Alkaline Phosphatase: 31 U/L — ABNORMAL LOW (ref 38–126)
Anion gap: 9 (ref 5–15)
BUN: 12 mg/dL (ref 6–20)
CO2: 23 mmol/L (ref 22–32)
Calcium: 8.2 mg/dL — ABNORMAL LOW (ref 8.9–10.3)
Chloride: 103 mmol/L (ref 98–111)
Creatinine, Ser: 0.45 mg/dL (ref 0.44–1.00)
GFR, Estimated: >60 mL/min (ref >60)
Glucose, Bld: 93 mg/dL (ref 70–99)
Potassium: 3.8 mmol/L (ref 3.5–5.1)
Sodium: 135 mmol/L (ref 135–145)
Total Bilirubin: 0.4 mg/dL (ref 0.3–1.2)
Total Protein: 5 g/dL — ABNORMAL LOW (ref 6.5–8.1)

## 2020-02-03 LAB — CK: Total CK: 4949 U/L — ABNORMAL HIGH (ref 38–234)

## 2020-02-03 LAB — MAGNESIUM: Magnesium: 1.8 mg/dL (ref 1.7–2.4)

## 2020-02-03 LAB — SJOGRENS SYNDROME-A EXTRACTABLE NUCLEAR ANTIBODY: SSA (Ro) (ENA) Antibody, IgG: <0.2 AI (ref 0.0–0.9)

## 2020-02-03 LAB — SJOGRENS SYNDROME-B EXTRACTABLE NUCLEAR ANTIBODY: SSB (La) (ENA) Antibody, IgG: <0.2 AI (ref 0.0–0.9)

## 2020-02-03 LAB — PHOSPHORUS: Phosphorus: 3.8 mg/dL (ref 2.5–4.6)

## 2020-02-03 LAB — ANA: Anti Nuclear Antibody (ANA): NEGATIVE

## 2020-02-03 NOTE — Progress Notes (Signed)
PROGRESS NOTE    Haley Sandoval  NFA:[id number] DOB: 2000/10/16 DOA: 01/30/2020 PCP: Barbette Merino, NP     Brief Narrative:  19 y.o. BF PMHx obesity class III, rhabdomyolysis, PID, recent admission at [facility] for rhabdomyolysis and possible pelvic inflammatory disease initially CK levels at that time was found to be 1 31,000 LFTs were elevated.  Gastroenterology was consulted at that time and felt that patient's elevated LFTs were likely from rhabdomyolysis.  CT abdomen pelvis done showed features concerning for pelvic inflammatory fat stranding which may represent sequela of mild mesenteritis versus pelvic inflammatory disease patient was empirically treated with ceftriaxone doxycycline metronidazole.  Subsequent which patient also started on steroids for the rhabdomyolysis and rhabdomyolysis trended down and at the time of discharge was around 13,000.    Patient continued to have pain and noticed some increasing swelling of the right buttock area concerning for abscess and presented to the ER today.  Patient states the pain has been worsening particular in the lower extremities and upper extremities.  Denies any nausea vomiting or diarrhea.  Has been having some subjective feeling of fever chills.  Patient states she has been taking the tapering dose of steroids.  Denies any vaginal discharge.  ED Course: In the ER patient was found to have a small abscess to right buttock which was drained by the ER physician and started on empiric antibiotics.  On my exam patient has significant tenderness in the thigh areas.  Patient also complains of swelling in the upper extremities.  Abdomen is nontender.  Labs are significant for CK level of 11,283 with lactic acid of 2.2 which improved with fluids to 1.8 WBC count of 20.8 LFTs are showing a downtrend of AST of 270 ALT of 78.  Covid test is negative.  Pregnant screen is negative.  I have ordered a repeat CT pelvis.    Subjective: 12/14  afebrile overnight.  A/O x4, feels slightly better.  Grandmother at bedside Per patient in March had small rash on her right buttocks.  Then in November rash began to enlarge and become painful, at that point patient also began to have pain in her lower extremities whenever she walked up steps or rose from a chair.  In addition also began to have pain in bilateral upper extremities whenever she reached over her head for objects.  No other rashes on her body.  No family history of autoimmune diseases.  States only took 3 days of the steroids prescribed to her at [facility].     Assessment & Plan: Covid vaccination;   Principal Problem:   Sepsis (HCC) Active Problems:   Rhabdomyolysis   Elevated liver enzymes   Obesity, Class III, BMI 40-49.9 (morbid obesity) (HCC)   Cellulitis   Morbidly obese (HCC)  Severe sepsis/PID -On admission meets criteria for sepsis HR> 90,> 12 K, lactic acid> 2 multiple points of infection to include abscess on buttocks, PID confirmed on CT scan. -Continue current antibiotics -Lactated Ringer's 178ml/hr -Trend lactic acid/procalcitonin Results for HELEN, WINTERHALTER (MRN [id number]) as of 02/01/2020 16:50  Ref. Range 01/30/2020 20:11 01/30/2020 21:44 01/31/2020 03:29 01/31/2020 07:11  Lactic Acid, Venous Latest Ref Range: 0.5 - 1.9 mmol/L 2.2 (HH) 1.8 1.3 1.3   Results for DAWNA, JAKES (MRN [id number]) as of 02/01/2020 16:50  Ref. Range 01/31/2020 03:29 01/31/2020 21:36 02/01/2020 03:56  Procalcitonin Latest Units: ng/mL <0.10 <0.10 <0.10   Rhabdomyolysis -See severe sepsis -Trend CK  Lab Results  Component  Value Date   CKTOTAL 4,949 (H) 02/03/2020   CKTOTAL 5,722 (H) 02/02/2020   CKTOTAL 9,011 (H) 02/01/2020   CKTOTAL 10,484 (H) 01/31/2020   CKTOTAL 11,283 (H) 01/30/2020    Myositis -Patient signs and symptoms are consistent with a myositis.  Patient only took 3 days of steroids. -Patient requires muscle biopsy however would not be safe  until sepsis and rhabdomyolysis resolved. -No family history of autoimmune disease. -12/12 send off myositis panel -12/15 will contact surgery although I expect they will not want to perform biopsy for at least 5 or 6 days.  Elevated liver enzymes -Most likely secondary to rhabdomyolysis. -Acute hepatitis panel negative  Morbidly obese -Once patient stable and discharged would benefit from weight loss program.   12/11 addendum -patient CT pelvis shows persistent pelvic inflammatory disease with some improvement.  I discussed with Dr. Vergie Living on-call OB/GYN who advised me to get transvaginal ultrasound also chlamydia and gonorrhea probe.  To reconsult them in the morning after getting the transvaginal ultrasound results. -GC probe pending -Transvaginal ultrasound completed but not read  DVT prophylaxis: Lovenox Code Status: Full Family Communication: 12/14 grandmother at bedside for discussion of plan of care answered all questions Status is: Inpatient    Dispo: The patient is from:               Anticipated d/c is to: Home              Anticipated d/c date is: 12/20              Patient currently unstable      Consultants:  12/10 RIGHT buttocks abscess reintubated for better growth 12/11 RIGHT AC NGTD 12/11 RIGHT forearm NGTD  12/11 RPR negative 12/11 Chlamydia/gonorrhea negative 12/12 acute hepatitis panel negative 12/12 acute hepatitis panel negative    Procedures/Significant Events:  12/11 CT pelvis W contrast;-defect in the posterior subcutaneous soft tissues overlying the right gluteal muscle. In this region, there is fat  induration without evidence for an abscess. -persistent diffuse inflammatory changes in the pelvis with a small amount of free fluid. This is similar but slightly improved from prior study. -Circumferential wall thickening of the urinary bladder. Correlation with urinalysis is recommended to exclude cystitis. -Apparent intracompartmental edema  involving the proximal thighs bilaterally with possible intramuscular edema bilaterally is nonspecific but can be seen in patients with rhabdomyolysis. Correlation with laboratory studies, specifically CK, is recommended. 12/11 MRI RIGHT femur/pelvis W0 contrast;Severe diffuse myositis. Findings could be due to post infectious myositis, paraneoplastic process, autoimmune disorders and drug reactions. 2. Open wound in the right buttock area. No abscess. 3. No findings for septic arthritis or osteomyelitis. 12/11 transvaginal ultrasound;Uterus and endometrium are unremarkable. Ovaries are not visualized. No definite adnexal abnormality is noted 12/12 PICC line pending      I have personally reviewed and interpreted all radiology studies and my findings are as above.  VENTILATOR SETTINGS:    Cultures 12/10 SARS coronavirus negative 12/10 influenza A/B negative 12/10 urine pending 12/10 abscess of buttock NGTD 12/11 blood pending    Antimicrobials: Anti-infectives (From admission, onward)   Start     Ordered Stop   01/31/20 1200  vancomycin (VANCOREADY) IVPB 1250 mg/250 mL        01/31/20 0230     01/31/20 1000  ceFEPIme (MAXIPIME) 2 g in sodium chloride 0.9 % 100 mL IVPB        01/31/20 0226     01/31/20 0330  vancomycin (VANCOREADY) IVPB 2000 mg/400  mL        01/31/20 0146 01/31/20 0517   01/31/20 0300  ceFEPIme (MAXIPIME) 2 g in sodium chloride 0.9 % 100 mL IVPB        01/31/20 0146 01/31/20 0247   01/30/20 2300  ceFAZolin (ANCEF) IVPB 1 g/50 mL premix        01/30/20 2258 01/30/20 2332       Devices    LINES / TUBES:      Continuous Infusions: . azithromycin 500 mg (02/03/20 0955)  . cefTRIAXone (ROCEPHIN)  IV Stopped (02/02/20 1806)  . lactated ringers 150 mL/hr at 02/03/20 0954     Objective: Vitals:   02/02/20 1439 02/02/20 1455 02/02/20 2052 02/03/20 0541  BP: (!) 141/63  135/70 (!) 129/59  Pulse: (!) 124 (!) 125 (!) 121 (!) 115  Resp: 19  20 18    Temp: 99 F (37.2 C)  99.4 F (37.4 C) 98.6 F (37 C)  TempSrc: Oral  Oral   SpO2: 100% 100% 100% 100%  Weight:      Height:        Intake/Output Summary (Last 24 hours) at 02/03/2020 1225 Last data filed at 02/03/2020 [id number] Gross per 24 hour  Intake 2640.92 ml  Output 1500 ml  Net 1140.92 ml   Filed Weights   01/30/20 2144  Weight: 134.7 kg   Physical Exam:  General: A/O x4 No acute respiratory distress Eyes: negative scleral hemorrhage, negative anisocoria, negative icterus ENT: Negative Runny nose, negative gingival bleeding, Neck:  Negative scars, masses, torticollis, lymphadenopathy, JVD Lungs: Clear to auscultation bilaterally without wheezes or crackles, erythematous area over right breast directly under where an EKG sticker occupied the area Cardiovascular: Regular rate and rhythm without murmur gallop or rub normal S1 and S2 Abdomen: MORBIDLY OBESE abdominal pain, nondistended, positive soft, bowel sounds, no rebound, no ascites, no appreciable mass Extremities: No significant cyanosis, clubbing.  Bilateral lower extremity swelling RIGHT>>> LEFT.  Painful when patient actively moves Skin: Negative rashes, lesions, ulcers Psychiatric:  Negative depression, negative anxiety, negative fatigue, negative mania  Central nervous system:  Cranial nerves II through XII intact, tongue/uvula midline, all extremities muscle strength 5/5, sensation intact throughout, negative dysarthria, negative expressive aphasia, negative receptive aphasia. .     Data Reviewed: Care during the described time interval was provided by me .  I have reviewed this patient's available data, including medical history, events of note, physical examination, and all test results as part of my evaluation.  CBC: Recent Labs  Lab 01/30/20 2011 01/31/20 0329 02/01/20 0356 02/02/20 1644 02/03/20 0436  WBC 20.4* 16.2* 13.6* 15.9* 15.7*  NEUTROABS 15.9* 11.3* 9.4* 12.0* 11.6*  HGB 13.1 11.7* 12.3  12.2 11.2*  HCT 39.9 36.6 38.7 37.8 34.6*  MCV 87.1 89.1 90.4 88.5 87.8  PLT 367 313 329 353 306   Basic Metabolic Panel: Recent Labs  Lab 01/30/20 2011 01/31/20 0329 01/31/20 0711 02/01/20 0356 02/02/20 1644 02/03/20 0436  NA 133* 134*  --  137 133* 135  K 4.0 4.1  --  3.8 3.7 3.8  CL 100 102  --  104 103 103  CO2 23 22  --  22 19* 23  GLUCOSE 118* 98  --  107* 92 93  BUN 14 13  --  16 13 12   CREATININE 0.54 0.54  --  0.60 0.47 0.45  CALCIUM 8.5* 8.1*  --  8.3* 8.2* 8.2*  MG  --   --   --  1.9 2.0 1.8  PHOS  --   --  4.1 4.9* 3.5 3.8   GFR: Estimated Creatinine Clearance: 156 mL/min (by C-G formula based on SCr of 0.45 mg/dL). Liver Function Tests: Recent Labs  Lab 01/30/20 2011 01/31/20 0329 02/01/20 0356 02/02/20 1644 02/03/20 0436  AST 270* 247* 205* 162* 152*  ALT 78* 70* 62* 53* 48*  ALKPHOS 53 36* 41 36* 31*  BILITOT 0.4 0.4 0.2* 0.7 0.4  PROT 6.3* 5.5* 5.3* 5.4* 5.0*  ALBUMIN 2.9* 2.6* 2.4* 2.4* 2.3*   No results for input(s): LIPASE, AMYLASE in the last 168 hours. No results for input(s): AMMONIA in the last 168 hours. Coagulation Profile: Recent Labs  Lab 01/30/20 2011  INR 1.0   Cardiac Enzymes: Recent Labs  Lab 01/30/20 2011 01/31/20 0329 02/01/20 0356 02/02/20 1644 02/03/20 0436  CKTOTAL 11,283* 10,484* 9,011* 5,722* 4,949*   BNP (last 3 results) No results for input(s): PROBNP in the last 8760 hours. HbA1C: No results for input(s): HGBA1C in the last 72 hours. CBG: No results for input(s): GLUCAP in the last 168 hours. Lipid Profile: No results for input(s): CHOL, HDL, LDLCALC, TRIG, CHOLHDL, LDLDIRECT in the last 72 hours. Thyroid Function Tests: No results for input(s): TSH, T4TOTAL, FREET4, T3FREE, THYROIDAB in the last 72 hours. Anemia Panel: No results for input(s): VITAMINB12, FOLATE, FERRITIN, TIBC, IRON, RETICCTPCT in the last 72 hours. Sepsis Labs: Recent Labs  Lab 01/30/20 2011 01/30/20 2144 01/31/20 0329  01/31/20 0711 01/31/20 2136 02/01/20 0356  PROCALCITON  --   --  <0.10  --  <0.10 <0.10  LATICACIDVEN 2.2* 1.8 1.3 1.3  --   --     Recent Results (from the past 240 hour(s))  Urine culture     Status: Abnormal   Collection Time: 01/30/20  8:12 PM   Specimen: In/Out Cath Urine  Result Value Ref Range Status   Specimen Description   Final    IN/OUT CATH URINE Performed at [facility], [address], [place], [place] [address]    Special Requests   Final    NONE Performed at [facility], [address], [place], [place] [address]    Culture MULTIPLE SPECIES PRESENT, SUGGEST RECOLLECTION (A)  Final   Report Status 02/01/2020 FINAL  Final  Wound or Superficial Culture     Status: None   Collection Time: 01/30/20  9:33 PM   Specimen: Abscess; Wound  Result Value Ref Range Status   Specimen Description   Final    ABSCESS Performed at [facility], [address], [place], [place] [address]    Special Requests   Final    NONE Performed at [facility], [address] [place], [place] [address]    Gram Stain NO WBC SEEN NO ORGANISMS SEEN   Final   Culture   Final    NORMAL SKIN FLORA Performed at [facility], [address], [place], [place] [address]    Report Status 02/02/2020 FINAL  Final  Resp Panel by RT-PCR (Flu A&B, Covid) Nasopharyngeal Swab     Status: None   Collection Time: 01/30/20 11:08 PM   Specimen: Nasopharyngeal Swab; Nasopharyngeal(NP) swabs in vial transport medium  Result Value Ref Range Status   SARS Coronavirus 2 by RT PCR NEGATIVE NEGATIVE Final    Comment: (NOTE) SARS-CoV-2 target nucleic acids are NOT DETECTED.  The SARS-CoV-2 RNA is generally detectable in upper respiratory specimens during the acute phase of infection. The lowest concentration  of SARS-CoV-2 viral copies this assay can detect is 138 copies/mL. A negative result does not preclude  SARS-Cov-2 infection and should not be used as the sole basis for treatment or other patient management decisions. A negative result may occur with  improper specimen collection/handling, submission of specimen other than nasopharyngeal swab, presence of viral mutation(s) within the areas targeted by this assay, and inadequate number of viral copies(<138 copies/mL). A negative result must be combined with clinical observations, patient history, and epidemiological information. The expected result is Negative.  Fact Sheet for Patients:  BloggerCourse.com  Fact Sheet for Healthcare Providers:  SeriousBroker.it  This test is no t yet approved or cleared by the [place] FDA and  has been authorized for detection and/or diagnosis of SARS-CoV-2 by FDA under an Emergency Use Authorization (EUA). This EUA will remain  in effect (meaning this test can be used) for the duration of the COVID-19 declaration under Section 564(b)(1) of the Act, 21 U.S.C.section 360bbb-3(b)(1), unless the authorization is terminated  or revoked sooner.       Influenza A by PCR NEGATIVE NEGATIVE Final   Influenza B by PCR NEGATIVE NEGATIVE Final    Comment: (NOTE) The Xpert Xpress SARS-CoV-2/FLU/RSV plus assay is intended as an aid in the diagnosis of influenza from Nasopharyngeal swab specimens and should not be used as a sole basis for treatment. Nasal washings and aspirates are unacceptable for Xpert Xpress SARS-CoV-2/FLU/RSV testing.  Fact Sheet for Patients: BloggerCourse.com  Fact Sheet for Healthcare Providers: SeriousBroker.it  This test is not yet approved or cleared by the [place] FDA and has been authorized for detection and/or diagnosis of SARS-CoV-2 by FDA under an Emergency Use Authorization (EUA). This EUA will remain in effect (meaning this test can be used) for the duration of  the COVID-19 declaration under Section 564(b)(1) of the Act, 21 U.S.C. section 360bbb-3(b)(1), unless the authorization is terminated or revoked.  Performed at [facility], [address] [place], [place] [address]   Culture, blood (routine x 2)     Status: None (Preliminary result)   Collection Time: 01/31/20  3:29 AM   Specimen: BLOOD  Result Value Ref Range Status   Specimen Description   Final    BLOOD RIGHT ANTECUBITAL Performed at [facility], [address], [place], [place] [address]    Special Requests   Final    BOTTLES DRAWN AEROBIC ONLY Blood Culture adequate volume Performed at [facility], [address], [place], [place] [address]    Culture   Final    NO GROWTH 2 DAYS Performed at [facility], [address] [place], [place] [address]    Report Status PENDING  Incomplete  Culture, blood (routine x 2)     Status: None (Preliminary result)   Collection Time: 01/31/20  3:29 AM   Specimen: BLOOD  Result Value Ref Range Status   Specimen Description   Final    BLOOD BLOOD RIGHT FOREARM Performed at [facility], [address], [place], [place] [address]    Special Requests   Final    BOTTLES DRAWN AEROBIC ONLY Blood Culture adequate volume Performed at [facility], [address] [place], [place] [address]    Culture   Final    NO GROWTH 2 DAYS Performed at [facility], [address] [place], [place] [address]    Report Status PENDING  Incomplete  Radiology Studies: [place] EKG SITE RITE  Result Date: 02/01/2020 If Site Rite image not attached, placement could not be confirmed due to current cardiac rhythm.       Scheduled Meds: . Chlorhexidine Gluconate Cloth  6 each Topical Daily  . metroNIDAZOLE  500 mg Oral Q8H  . oxyCODONE  10 mg Oral Q12H   Continuous Infusions: . azithromycin 500 mg (02/03/20 0955)  . cefTRIAXone  (ROCEPHIN)  IV Stopped (02/02/20 1806)  . lactated ringers 150 mL/hr at 02/03/20 0954     LOS: 3 days    Time spent:40 min    Aldin Drees, Roselind Messier, MD [facility] Pager [phone]  If 7PM-7AM, please contact night-coverage [email or web address] Password [facility] 02/03/2020, 12:25 PM

## 2020-02-03 NOTE — Plan of Care (Signed)
Plan of care discussed with patient.   Encouraged family presence at bedside to assist with meals/hydration.   Patient reported her grandmother is supposed to come by today.     [place], Charity fundraiser

## 2020-02-04 ENCOUNTER — Ambulatory Visit: Payer: Medicaid Other | Admitting: Internal Medicine

## 2020-02-04 DIAGNOSIS — A419 Sepsis, unspecified organism: Secondary | ICD-10-CM | POA: Diagnosis not present

## 2020-02-04 LAB — CBC WITH DIFFERENTIAL/PLATELET
Abs Immature Granulocytes: 0.58 10*3/uL — ABNORMAL HIGH (ref 0.00–0.07)
Basophils Absolute: 0.1 10*3/uL (ref 0.0–0.1)
Basophils Relative: 1 %
Eosinophils Absolute: 0.1 10*3/uL (ref 0.0–0.5)
Eosinophils Relative: 1 %
HCT: 33.2 % — ABNORMAL LOW (ref 36.0–46.0)
Hemoglobin: 10.8 g/dL — ABNORMAL LOW (ref 12.0–15.0)
Immature Granulocytes: 4 %
Lymphocytes Relative: 11 %
Lymphs Abs: 1.5 10*3/uL (ref 0.7–4.0)
MCH: 28.8 pg (ref 26.0–34.0)
MCHC: 32.5 g/dL (ref 30.0–36.0)
MCV: 88.5 fL (ref 80.0–100.0)
Monocytes Absolute: 1.5 10*3/uL — ABNORMAL HIGH (ref 0.1–1.0)
Monocytes Relative: 11 %
Neutro Abs: 10.1 10*3/uL — ABNORMAL HIGH (ref 1.7–7.7)
Neutrophils Relative %: 72 %
Platelets: 307 10*3/uL (ref 150–400)
RBC: 3.75 MIL/uL — ABNORMAL LOW (ref 3.87–5.11)
RDW: 15.7 % — ABNORMAL HIGH (ref 11.5–15.5)
WBC: 13.9 10*3/uL — ABNORMAL HIGH (ref 4.0–10.5)
nRBC: 0 % (ref 0.0–0.2)

## 2020-02-04 LAB — COMPREHENSIVE METABOLIC PANEL
ALT: 45 U/L — ABNORMAL HIGH (ref 0–44)
AST: 151 U/L — ABNORMAL HIGH (ref 15–41)
Albumin: 2.1 g/dL — ABNORMAL LOW (ref 3.5–5.0)
Alkaline Phosphatase: 31 U/L — ABNORMAL LOW (ref 38–126)
Anion gap: 7 (ref 5–15)
BUN: 8 mg/dL (ref 6–20)
CO2: 25 mmol/L (ref 22–32)
Calcium: 8 mg/dL — ABNORMAL LOW (ref 8.9–10.3)
Chloride: 105 mmol/L (ref 98–111)
Creatinine, Ser: 0.42 mg/dL — ABNORMAL LOW (ref 0.44–1.00)
GFR, Estimated: >60 mL/min (ref >60)
Glucose, Bld: 103 mg/dL — ABNORMAL HIGH (ref 70–99)
Potassium: 3.6 mmol/L (ref 3.5–5.1)
Sodium: 137 mmol/L (ref 135–145)
Total Bilirubin: 0.4 mg/dL (ref 0.3–1.2)
Total Protein: 4.9 g/dL — ABNORMAL LOW (ref 6.5–8.1)

## 2020-02-04 LAB — MAGNESIUM: Magnesium: 1.7 mg/dL (ref 1.7–2.4)

## 2020-02-04 LAB — ALDOLASE: Aldolase: 21.8 U/L — ABNORMAL HIGH (ref 3.3–10.3)

## 2020-02-04 LAB — CK: Total CK: 5501 U/L — ABNORMAL HIGH (ref 38–234)

## 2020-02-04 LAB — PHOSPHORUS: Phosphorus: 3.4 mg/dL (ref 2.5–4.6)

## 2020-02-04 NOTE — Plan of Care (Signed)
  Problem: Education: Goal: Knowledge of General Education information will improve Description Including pain rating scale, medication(s)/side effects and non-pharmacologic comfort measures Outcome: Progressing   

## 2020-02-04 NOTE — Progress Notes (Addendum)
PROGRESS NOTE    Haley Sandoval  JQG:[id number] DOB: 09-27-2000 DOA: 01/30/2020 PCP: Vevelyn Francois, NP   Brief Narrative:  This 19 years old morbidly obese female with PMH of rhabdomyolysis, PID, recent admission at [facility] for rhabdomyolysis and possible PID,  initially CK levels were 31,000,  LFTs were elevated. GI was consulted,  felt that the patient's LFT are elevated because of rhabdomyolysis.  CT abdomen concerning for pelvic inflammatory disease.  Patient was empirically started on ceftriaxone, doxycycline and metronidazole.  Patient was also started on steroids for rhabdomyolysis.  CK level trending down.  Patient continued to have pain and has developed swelling of the right buttock area concerning for abscess. Patient was found to have small abscess to the right buttock which was drained by ER physician and started on empiric antibiotics.  Assessment & Plan:   Principal Problem:   Sepsis ([place]) Active Problems:   Rhabdomyolysis   Elevated liver enzymes   Obesity, Class III, BMI 40-49.9 (morbid obesity) (HCC)   Cellulitis   Morbidly obese (HCC)   Severe sepsis could be secondary to PID. Patient met sepsis criteria on admission.  HR> 90,> 12 K, lactic acid> 2 multiple points of infection to include abscess on buttocks, PID confirmed on CT scan. Continue current antibiotics. Continue Lactated Ringer's 146m/hr Trend lactic acid/procalcitonin.  CT pelvis shows persistent pelvic inflammatory disease with some improvement.  Dr. PIlda Basseton-call OB/GYN was consulted , recommended transvaginal ultrasound  and chlamydia and gonorrhea probe.   Chlamydia and gonorrhea is negative.  Transvaginal ultrasound unremarkable.  Rhabdomyolysis Found to have elevated CK levels Continue aggressive IV hydration CK levels are trending down.  Myositis Patient signs and symptoms are consistent with a myositis.  Patient only took 3 days of steroids. Patient requires muscle  biopsy however would not be safe until sepsis and rhabdomyolysis resolved. No family history of autoimmune disease. 12/12 send off myositis panel 12/16 will contact surgery although I expect they will not want to perform biopsy for at least 5 or 6 days.  Elevated liver enzymes Most likely secondary to rhabdomyolysis. Acute hepatitis panel negative. LFTs trending down.  Morbidly obese Once patient stable and discharged would benefit from weight loss program.  DVT prophylaxis: Lovenox Code Status: Full code Family Communication: Grandmother at bedside  Disposition Plan:   Status is: Inpatient  Remains inpatient appropriate because:Inpatient level of care appropriate due to severity of illness   Dispo: The patient is from: Home              Anticipated d/c is to: Home              Anticipated d/c date is: > 3 days              Patient currently is not medically stable to d/c.      Consultants:   General surgery, GYN.  Procedures: Incision and drainage of right buttock abscess Antimicrobials:   Anti-infectives (From admission, onward)   Start     Dose/Rate Route Frequency Ordered Stop   02/02/20 1600  cefTRIAXone (ROCEPHIN) 2 g in sodium chloride 0.9 % 100 mL IVPB        2 g 200 mL/hr over 30 Minutes Intravenous Every 24 hours 02/02/20 1012     02/02/20 1430  azithromycin (ZITHROMAX) 500 mg in sodium chloride 0.9 % 250 mL IVPB        500 mg 250 mL/hr over 60 Minutes Intravenous Daily 02/02/20 1335  02/02/20 1100  metroNIDAZOLE (FLAGYL) tablet 500 mg        500 mg Oral Every 8 hours 02/02/20 1012     02/02/20 1100  doxycycline (VIBRA-TABS) tablet 100 mg  Status:  Discontinued        100 mg Oral Every 12 hours 02/02/20 1012 02/02/20 1335   01/31/20 1200  vancomycin (VANCOREADY) IVPB 1250 mg/250 mL  Status:  Discontinued        1,250 mg 166.7 mL/hr over 90 Minutes Intravenous Every 8 hours 01/31/20 0230 01/31/20 1052   01/31/20 1200  vancomycin (VANCOREADY) IVPB  1500 mg/300 mL  Status:  Discontinued        1,500 mg 150 mL/hr over 120 Minutes Intravenous 2 times daily 01/31/20 1052 02/02/20 1012   01/31/20 1000  ceFEPIme (MAXIPIME) 2 g in sodium chloride 0.9 % 100 mL IVPB  Status:  Discontinued        2 g 200 mL/hr over 30 Minutes Intravenous Every 8 hours 01/31/20 0226 02/02/20 1012   01/31/20 0330  vancomycin (VANCOREADY) IVPB 2000 mg/400 mL        2,000 mg 200 mL/hr over 120 Minutes Intravenous  Once 01/31/20 0146 01/31/20 0517   01/31/20 0300  ceFEPIme (MAXIPIME) 2 g in sodium chloride 0.9 % 100 mL IVPB        2 g 200 mL/hr over 30 Minutes Intravenous  Once 01/31/20 0146 01/31/20 0247   01/30/20 2300  ceFAZolin (ANCEF) IVPB 1 g/50 mL premix        1 g 100 mL/hr over 30 Minutes Intravenous  Once 01/30/20 2258 01/30/20 2332      Subjective: Patient was seen and examined at bedside.  She continues to have pain in the thighs and arms, denies any overnight events.  Objective: Vitals:   02/04/20 0155 02/04/20 0552 02/04/20 0923 02/04/20 1337  BP: (!) 145/67 (!) 151/65 (!) 143/79 (!) 146/60  Pulse: (!) 116 (!) 119 (!) 115 (!) 127  Resp: '15 15 16 16  ' Temp: 98.6 F (37 C) 98.9 F (37.2 C) 98.5 F (36.9 C) 98.7 F (37.1 C)  TempSrc: Oral Oral  Oral  SpO2: 100% 100% 100% 100%  Weight:      Height:        Intake/Output Summary (Last 24 hours) at 02/04/2020 1548 Last data filed at 02/04/2020 1300 Gross per 24 hour  Intake 4031.46 ml  Output 3050 ml  Net 981.46 ml   Filed Weights   01/30/20 2144  Weight: 134.7 kg    Examination:  General exam: Appears calm and comfortable , not in any distress. Respiratory system: Clear to auscultation. Respiratory effort normal. Cardiovascular system: S1 & S2 heard, RRR. No JVD, murmurs, rubs, gallops or clicks. No pedal edema. Gastrointestinal system: Abdomen is nondistended, soft and nontender. No organomegaly or masses felt. Normal bowel sounds heard. Central nervous system: Alert and  oriented. No focal neurological deficits. Extremities: Bilateral lower extremity swelling. Skin: No rashes, lesions or ulcers Psychiatry: Judgement and insight appear normal. Mood & affect appropriate.     Data Reviewed: I have personally reviewed following labs and imaging studies  CBC: Recent Labs  Lab 01/31/20 0329 02/01/20 0356 02/02/20 1644 02/03/20 0436 02/04/20 0310  WBC 16.2* 13.6* 15.9* 15.7* 13.9*  NEUTROABS 11.3* 9.4* 12.0* 11.6* 10.1*  HGB 11.7* 12.3 12.2 11.2* 10.8*  HCT 36.6 38.7 37.8 34.6* 33.2*  MCV 89.1 90.4 88.5 87.8 88.5  PLT 313 329 353 306 625   Basic Metabolic Panel: Recent  Labs  Lab 01/31/20 0329 01/31/20 0711 02/01/20 0356 02/02/20 1644 02/03/20 0436 02/04/20 0310  NA 134*  --  137 133* 135 137  K 4.1  --  3.8 3.7 3.8 3.6  CL 102  --  104 103 103 105  CO2 22  --  22 19* 23 25  GLUCOSE 98  --  107* 92 93 103*  BUN 13  --  '16 13 12 8  ' CREATININE 0.54  --  0.60 0.47 0.45 0.42*  CALCIUM 8.1*  --  8.3* 8.2* 8.2* 8.0*  MG  --   --  1.9 2.0 1.8 1.7  PHOS  --  4.1 4.9* 3.5 3.8 3.4   GFR: Estimated Creatinine Clearance: 156 mL/min (A) (by C-G formula based on SCr of 0.42 mg/dL (L)). Liver Function Tests: Recent Labs  Lab 01/31/20 0329 02/01/20 0356 02/02/20 1644 02/03/20 0436 02/04/20 0310  AST 247* 205* 162* 152* 151*  ALT 70* 62* 53* 48* 45*  ALKPHOS 36* 41 36* 31* 31*  BILITOT 0.4 0.2* 0.7 0.4 0.4  PROT 5.5* 5.3* 5.4* 5.0* 4.9*  ALBUMIN 2.6* 2.4* 2.4* 2.3* 2.1*   No results for input(s): LIPASE, AMYLASE in the last 168 hours. No results for input(s): AMMONIA in the last 168 hours. Coagulation Profile: Recent Labs  Lab 01/30/20 2011  INR 1.0   Cardiac Enzymes: Recent Labs  Lab 01/31/20 0329 02/01/20 0356 02/02/20 1644 02/03/20 0436 02/04/20 0310  CKTOTAL 10,484* 9,011* 5,722* 4,949* 5,501*   BNP (last 3 results) No results for input(s): PROBNP in the last 8760 hours. HbA1C: No results for input(s): HGBA1C in the last  72 hours. CBG: No results for input(s): GLUCAP in the last 168 hours. Lipid Profile: No results for input(s): CHOL, HDL, LDLCALC, TRIG, CHOLHDL, LDLDIRECT in the last 72 hours. Thyroid Function Tests: No results for input(s): TSH, T4TOTAL, FREET4, T3FREE, THYROIDAB in the last 72 hours. Anemia Panel: No results for input(s): VITAMINB12, FOLATE, FERRITIN, TIBC, IRON, RETICCTPCT in the last 72 hours. Sepsis Labs: Recent Labs  Lab 01/30/20 2011 01/30/20 2144 01/31/20 0329 01/31/20 0711 01/31/20 2136 02/01/20 0356  PROCALCITON  --   --  <0.10  --  <0.10 <0.10  LATICACIDVEN 2.2* 1.8 1.3 1.3  --   --     Recent Results (from the past 240 hour(s))  Urine culture     Status: Abnormal   Collection Time: 01/30/20  8:12 PM   Specimen: In/Out Cath Urine  Result Value Ref Range Status   Specimen Description   Final    IN/OUT CATH URINE Performed at [facility], [place] [address] [place], [place] [address]    Special Requests   Final    NONE Performed at [facility], [place] [address] [place], [place] [address]    Culture MULTIPLE SPECIES PRESENT, SUGGEST RECOLLECTION (A)  Final   Report Status 02/01/2020 FINAL  Final  Wound or Superficial Culture     Status: None   Collection Time: 01/30/20  9:33 PM   Specimen: Abscess; Wound  Result Value Ref Range Status   Specimen Description   Final    ABSCESS Performed at [place] [address], [place], [place] [address]    Special Requests   Final    NONE Performed at [facility], [place] [address], [place], [place] [address]    Gram Stain NO WBC SEEN NO ORGANISMS SEEN   Final   Culture   Final  NORMAL SKIN FLORA Performed at [facility], [place] [address], [place], [place] [address]    Report Status 02/02/2020 FINAL  Final  Resp Panel by RT-PCR (Flu A&B, Covid) Nasopharyngeal Swab     Status: None   Collection Time: 01/30/20 11:08 PM   Specimen:  Nasopharyngeal Swab; Nasopharyngeal(NP) swabs in vial transport medium  Result Value Ref Range Status   SARS Coronavirus 2 by RT PCR NEGATIVE NEGATIVE Final    Comment: (NOTE) SARS-CoV-2 target nucleic acids are NOT DETECTED.  The SARS-CoV-2 RNA is generally detectable in upper respiratory specimens during the acute phase of infection. The lowest concentration of SARS-CoV-2 viral copies this assay can detect is 138 copies/mL. A negative result does not preclude SARS-Cov-2 infection and should not be used as the sole basis for treatment or other patient management decisions. A negative result may occur with  improper specimen collection/handling, submission of specimen other than nasopharyngeal swab, presence of viral mutation(s) within the areas targeted by this assay, and inadequate number of viral copies(<138 copies/mL). A negative result must be combined with clinical observations, patient history, and epidemiological information. The expected result is Negative.  Fact Sheet for Patients:  EntrepreneurPulse.com.au  Fact Sheet for Healthcare Providers:  IncredibleEmployment.be  This test is no t yet approved or cleared by the [place] FDA and  has been authorized for detection and/or diagnosis of SARS-CoV-2 by FDA under an Emergency Use Authorization (EUA). This EUA will remain  in effect (meaning this test can be used) for the duration of the COVID-19 declaration under Section 564(b)(1) of the Act, 21 U.S.C.section 360bbb-3(b)(1), unless the authorization is terminated  or revoked sooner.       Influenza A by PCR NEGATIVE NEGATIVE Final   Influenza B by PCR NEGATIVE NEGATIVE Final    Comment: (NOTE) The Xpert Xpress SARS-CoV-2/FLU/RSV plus assay is intended as an aid in the diagnosis of influenza from Nasopharyngeal swab specimens and should not be used as a sole basis for treatment. Nasal washings and aspirates are unacceptable for  Xpert Xpress SARS-CoV-2/FLU/RSV testing.  Fact Sheet for Patients: EntrepreneurPulse.com.au  Fact Sheet for Healthcare Providers: IncredibleEmployment.be  This test is not yet approved or cleared by the [place] FDA and has been authorized for detection and/or diagnosis of SARS-CoV-2 by FDA under an Emergency Use Authorization (EUA). This EUA will remain in effect (meaning this test can be used) for the duration of the COVID-19 declaration under Section 564(b)(1) of the Act, 21 U.S.C. section 360bbb-3(b)(1), unless the authorization is terminated or revoked.  Performed at [facility], [place] [address], [place], [place] [address]   Culture, blood (routine x 2)     Status: None (Preliminary result)   Collection Time: 01/31/20  3:29 AM   Specimen: BLOOD  Result Value Ref Range Status   Specimen Description   Final    BLOOD RIGHT ANTECUBITAL Performed at [place] [address] [place], [place] [address]    Special Requests   Final    BOTTLES DRAWN AEROBIC ONLY Blood Culture adequate volume Performed at [place] [address] [place], [place] [address]    Culture   Final    NO GROWTH 4 DAYS Performed at [facility], [place] [address], [place], [place] [address]    Report Status PENDING  Incomplete  Culture, blood (routine x 2)     Status: None (Preliminary result)   Collection Time: 01/31/20  3:29 AM  Specimen: BLOOD  Result Value Ref Range Status   Specimen Description   Final    BLOOD BLOOD RIGHT FOREARM Performed at [place] [address] [place], [place] [address]    Special Requests   Final    BOTTLES DRAWN AEROBIC ONLY Blood Culture adequate volume Performed at [place] [address], [place], [place] [address]    Culture   Final    NO GROWTH 4 DAYS Performed at [facility], [place] [address], [place], [place] [address]    Report Status PENDING  Incomplete     Radiology Studies: No results found.  Scheduled Meds: . Chlorhexidine Gluconate Cloth  6 each Topical Daily  . metroNIDAZOLE  500 mg Oral Q8H  . oxyCODONE  10 mg Oral Q12H   Continuous Infusions: . azithromycin Stopped (02/04/20 1027)  . cefTRIAXone (ROCEPHIN)  IV 2 g (02/03/20 1618)  . lactated ringers 150 mL/hr at 02/04/20 1450     LOS: 4 days    Time spent: 35 mins    Layce Sprung, MD [facility]   If 7PM-7AM, please contact night-coverage

## 2020-02-04 NOTE — TOC Initial Note (Signed)
Transition of Care [facility]) - Initial/Assessment Note    Patient Details  Name: Haley Sandoval MRN: [id number] Date of Birth: 15-Aug-2000  Transition of Care (TOC) CM/SW Contact:    Lennart Pall, LCSW Phone Number: 02/04/2020, 11:20 AM  Clinical Narrative:   Met with pt and mother this morning to introduce self/role and discuss possible dc needs.  Pt very pleasant and mother very involved and supportive of her.  Pt reports that was recently discharged and was to begin OPPT in [place], however, was readmitted prior to 1st appt.  She is now concerned that she may need to dc home with PICC and abx and how that would be managed.  Reviewed process of HHRN and abx referrals as well as need for teaching - pt and mother agreeable.  IF HHRN is needed, may be able to secure a few HHPT sessions as well.   Mother confirms that she and pt do live together, however, mother works f/t days.  Pt will need to be independent and safe to manage alone while mother at work.  Would benefit from PT/OT evals while here to determine her level of independence with mobility and ADLs.                 Explained that SW will continue to monitor and make necessary referrals.   Expected Discharge Plan: [place] Barriers to Discharge: Continued Medical Work up   Patient Goals and CMS Choice Patient states their goals for this hospitalization and ongoing recovery are:: to return home      Expected Discharge Plan and Services Expected Discharge Plan: [place] In-house Referral: Clinical Social Work     Living arrangements for the past 2 months: Single Family Home                                      Prior Living Arrangements/Services Living arrangements for the past 2 months: Single Family Home Lives with:: Parents Patient language and need for interpreter reviewed:: Yes Do you feel safe going back to the place where you live?: Yes      Need for Family Participation  in Patient Care: Yes (Comment) Care giver support system in place?: Yes (comment)   Criminal Activity/Legal Involvement Pertinent to Current Situation/Hospitalization: No - Comment as needed  Activities of Daily Living Home Assistive Devices/Equipment: None ADL Screening (condition at time of admission) Patient's cognitive ability adequate to safely complete daily activities?: Yes Is the patient deaf or have difficulty hearing?: Yes Does the patient have difficulty seeing, even when wearing glasses/contacts?: No Does the patient have difficulty concentrating, remembering, or making decisions?: No Patient able to express need for assistance with ADLs?: Yes Does the patient have difficulty dressing or bathing?: Yes Independently performs ADLs?: Yes (appropriate for developmental age) Does the patient have difficulty walking or climbing stairs?: No Weakness of Legs: Both Weakness of Arms/Hands: Both  Permission Sought/Granted Permission sought to share information with : Family Supports Permission granted to share information with : Yes, Verbal Permission Granted  Share Information with NAME: Nereyda Bowler     Permission granted to share info w Relationship: mother  Permission granted to share info w Contact Information: [phone]  Emotional Assessment Appearance:: Appears stated age Attitude/Demeanor/Rapport: Engaged,Gracious Affect (typically observed): Accepting,Pleasant Orientation: : Oriented to Self,Oriented to Place,Oriented to  Time,Oriented to Situation Alcohol / Substance Use: Not Applicable Psych  Involvement: No (comment)  Admission diagnosis:  Abscess [L02.91] Cellulitis [L03.90] Sepsis, due to unspecified organism, unspecified whether acute organ dysfunction present ([place]) [A41.9] Sepsis ([place]) [A41.9] Patient Active Problem List   Diagnosis Date Noted  . Sepsis ([place]) 01/31/2020  . Morbidly obese ([place]) 01/31/2020  . Cellulitis 01/30/2020  . Atypical pigmented  skin lesion 01/29/2020  . Non-intractable vomiting   . Constipation   . Hepatitis   . Rhabdomyolysis 01/18/2020  . Elevated liver enzymes 01/18/2020  . Acute febrile illness 01/18/2020  . Hypoalbuminemia 01/18/2020  . Hyponatremia 01/18/2020  . Obesity, Class III, BMI 40-49.9 (morbid obesity) ([place]) 01/18/2020   PCP:  Vevelyn Francois, NP Pharmacy:   [id number] [place], [place] - [address] [place]  [id number]: [phone]: [phone]    Social Determinants of Health (SDOH) Interventions    Readmission Risk Interventions Readmission Risk Prevention Plan 02/04/2020  Transportation Screening Complete  PCP or Specialist Appt within 5-7 Days Complete  Home Care Screening Complete  Medication Review (RN CM) Complete  Some recent data might be hidden

## 2020-02-05 DIAGNOSIS — A419 Sepsis, unspecified organism: Secondary | ICD-10-CM | POA: Diagnosis not present

## 2020-02-05 LAB — CK: Total CK: 5629 U/L — ABNORMAL HIGH (ref 38–234)

## 2020-02-05 LAB — ALDOLASE: Aldolase: 24.9 U/L — ABNORMAL HIGH (ref 3.3–10.3)

## 2020-02-05 LAB — CBC WITH DIFFERENTIAL/PLATELET
Abs Immature Granulocytes: 0.43 10*3/uL — ABNORMAL HIGH (ref 0.00–0.07)
Basophils Absolute: 0.1 10*3/uL (ref 0.0–0.1)
Basophils Relative: 1 %
Eosinophils Absolute: 0.1 10*3/uL (ref 0.0–0.5)
Eosinophils Relative: 1 %
HCT: 33.4 % — ABNORMAL LOW (ref 36.0–46.0)
Hemoglobin: 10.8 g/dL — ABNORMAL LOW (ref 12.0–15.0)
Immature Granulocytes: 3 %
Lymphocytes Relative: 16 %
Lymphs Abs: 2.1 10*3/uL (ref 0.7–4.0)
MCH: 28.9 pg (ref 26.0–34.0)
MCHC: 32.3 g/dL (ref 30.0–36.0)
MCV: 89.3 fL (ref 80.0–100.0)
Monocytes Absolute: 1.5 10*3/uL — ABNORMAL HIGH (ref 0.1–1.0)
Monocytes Relative: 11 %
Neutro Abs: 8.8 10*3/uL — ABNORMAL HIGH (ref 1.7–7.7)
Neutrophils Relative %: 68 %
Platelets: 319 10*3/uL (ref 150–400)
RBC: 3.74 MIL/uL — ABNORMAL LOW (ref 3.87–5.11)
RDW: 15.9 % — ABNORMAL HIGH (ref 11.5–15.5)
WBC: 13 10*3/uL — ABNORMAL HIGH (ref 4.0–10.5)
nRBC: 0 % (ref 0.0–0.2)

## 2020-02-05 LAB — COMPREHENSIVE METABOLIC PANEL
ALT: 44 U/L (ref 0–44)
AST: 142 U/L — ABNORMAL HIGH (ref 15–41)
Albumin: 2.1 g/dL — ABNORMAL LOW (ref 3.5–5.0)
Alkaline Phosphatase: 34 U/L — ABNORMAL LOW (ref 38–126)
Anion gap: 9 (ref 5–15)
BUN: 8 mg/dL (ref 6–20)
CO2: 25 mmol/L (ref 22–32)
Calcium: 7.9 mg/dL — ABNORMAL LOW (ref 8.9–10.3)
Chloride: 105 mmol/L (ref 98–111)
Creatinine, Ser: 0.45 mg/dL (ref 0.44–1.00)
GFR, Estimated: >60 mL/min (ref >60)
Glucose, Bld: 98 mg/dL (ref 70–99)
Potassium: 3.7 mmol/L (ref 3.5–5.1)
Sodium: 139 mmol/L (ref 135–145)
Total Bilirubin: 0.3 mg/dL (ref 0.3–1.2)
Total Protein: 4.8 g/dL — ABNORMAL LOW (ref 6.5–8.1)

## 2020-02-05 LAB — VARICELLA-ZOSTER BY PCR: Varicella-Zoster, PCR: NEGATIVE

## 2020-02-05 LAB — MAGNESIUM: Magnesium: 1.6 mg/dL — ABNORMAL LOW (ref 1.7–2.4)

## 2020-02-05 LAB — PHOSPHORUS: Phosphorus: 4 mg/dL (ref 2.5–4.6)

## 2020-02-05 MED ORDER — POLYETHYLENE GLYCOL 3350 17 G PO PACK
17.0000 g | PACK | Freq: Two times a day (BID) | ORAL | Status: DC
  Administered 2020-02-05 – 2020-02-10 (×5): 17 g via ORAL
  Filled 2020-02-05 (×11): qty 1

## 2020-02-05 MED ORDER — SENNA 8.6 MG PO TABS
1.0000 | ORAL_TABLET | Freq: Every day | ORAL | Status: DC
  Administered 2020-02-05 – 2020-02-13 (×3): 8.6 mg via ORAL
  Filled 2020-02-05 (×6): qty 1

## 2020-02-05 MED ORDER — MAGNESIUM SULFATE 2 GM/50ML IV SOLN
2.0000 g | Freq: Once | INTRAVENOUS | Status: AC
  Administered 2020-02-05: 06:00:00 2 g via INTRAVENOUS
  Filled 2020-02-05: qty 50

## 2020-02-05 NOTE — Plan of Care (Signed)
  Problem: Education: Goal: Knowledge of General Education information will improve Description Including pain rating scale, medication(s)/side effects and non-pharmacologic comfort measures Outcome: Progressing   

## 2020-02-05 NOTE — Progress Notes (Signed)
PROGRESS NOTE    Haley Sandoval  YQM:[id number] DOB: 2000/11/29 DOA: 01/30/2020 PCP: Vevelyn Francois, NP   Brief Narrative:  This 19 years old morbidly obese female with PMH of rhabdomyolysis, PID, recent admission at [facility] for rhabdomyolysis and possible PID,  initially CK levels were 31,000,  LFTs were elevated. GI was consulted,  felt that the patient's LFT are elevated because of rhabdomyolysis.  CT abdomen concerning for pelvic inflammatory disease.  Patient was empirically started on ceftriaxone, doxycycline and metronidazole.  Patient was also started on steroids for rhabdomyolysis.  CK level trending down.  Patient continued to have pain and has developed swelling of the right buttock area concerning for abscess. Patient was found to have small abscess to the right buttock which was drained by ER physician and started on empiric antibiotics.  Assessment & Plan:   Principal Problem:   Sepsis ([place]) Active Problems:   Rhabdomyolysis   Elevated liver enzymes   Obesity, Class III, BMI 40-49.9 (morbid obesity) (HCC)   Cellulitis   Morbidly obese (HCC)   Severe sepsis could be secondary to PID and right buttock Abscess. Patient met sepsis criteria on admission.  HR> 90,> 12 K, lactic acid> 2 multiple points of infection to include abscess on buttocks, PID confirmed on CT scan. Continue current antibiotics. Continue Lactated Ringer's 19m/hr. Trend lactic acid/procalcitonin.  CT pelvis shows persistent pelvic inflammatory disease with some improvement.  Dr. PIlda Basseton-call OB/GYN was consulted , recommended transvaginal ultrasound  and chlamydia and gonorrhea probe.   Chlamydia and gonorrhea is negative.  Transvaginal ultrasound unremarkable.  Rhabdomyolysis Found to have elevated CK levels Continue aggressive IV hydration CK levels are trending down.  Myositis Patient signs and symptoms are consistent with myositis.  Patient only took 3 days of  steroids. Patient requires muscle biopsy however would not be safe until sepsis and rhabdomyolysis resolved. No family history of autoimmune disease. 12/12 Sent myositis panel, So far negative. 12/16 will contact surgery although I expect they will not want to perform biopsy for at least 5 or 6 days.  Elevated liver enzymes Most likely secondary to rhabdomyolysis. Acute hepatitis panel negative. LFTs trending down.  Morbidly obese Once patient stable and discharged would benefit from weight loss program.  DVT prophylaxis: Lovenox Code Status: Full code Family Communication: Grandmother at bedside  Disposition Plan:   Status is: Inpatient  Remains inpatient appropriate because:Inpatient level of care appropriate due to severity of illness   Dispo: The patient is from: Home              Anticipated d/c is to: Home              Anticipated d/c date is: > 3 days              Patient currently is not medically stable to d/c.   Consultants:   General surgery, GYN.  Procedures: Incision and drainage of right buttock abscess Antimicrobials:   Anti-infectives (From admission, onward)   Start     Dose/Rate Route Frequency Ordered Stop   02/02/20 1600  cefTRIAXone (ROCEPHIN) 2 g in sodium chloride 0.9 % 100 mL IVPB        2 g 200 mL/hr over 30 Minutes Intravenous Every 24 hours 02/02/20 1012     02/02/20 1430  azithromycin (ZITHROMAX) 500 mg in sodium chloride 0.9 % 250 mL IVPB        500 mg 250 mL/hr over 60 Minutes Intravenous Daily 02/02/20 1335  02/02/20 1100  metroNIDAZOLE (FLAGYL) tablet 500 mg        500 mg Oral Every 8 hours 02/02/20 1012     02/02/20 1100  doxycycline (VIBRA-TABS) tablet 100 mg  Status:  Discontinued        100 mg Oral Every 12 hours 02/02/20 1012 02/02/20 1335   01/31/20 1200  vancomycin (VANCOREADY) IVPB 1250 mg/250 mL  Status:  Discontinued        1,250 mg 166.7 mL/hr over 90 Minutes Intravenous Every 8 hours 01/31/20 0230 01/31/20 1052    01/31/20 1200  vancomycin (VANCOREADY) IVPB 1500 mg/300 mL  Status:  Discontinued        1,500 mg 150 mL/hr over 120 Minutes Intravenous 2 times daily 01/31/20 1052 02/02/20 1012   01/31/20 1000  ceFEPIme (MAXIPIME) 2 g in sodium chloride 0.9 % 100 mL IVPB  Status:  Discontinued        2 g 200 mL/hr over 30 Minutes Intravenous Every 8 hours 01/31/20 0226 02/02/20 1012   01/31/20 0330  vancomycin (VANCOREADY) IVPB 2000 mg/400 mL        2,000 mg 200 mL/hr over 120 Minutes Intravenous  Once 01/31/20 0146 01/31/20 0517   01/31/20 0300  ceFEPIme (MAXIPIME) 2 g in sodium chloride 0.9 % 100 mL IVPB        2 g 200 mL/hr over 30 Minutes Intravenous  Once 01/31/20 0146 01/31/20 0247   01/30/20 2300  ceFAZolin (ANCEF) IVPB 1 g/50 mL premix        1 g 100 mL/hr over 30 Minutes Intravenous  Once 01/30/20 2258 01/30/20 2332      Subjective: Patient was seen and examined at bedside.  No overnight events.  She continues to have pain in the thighs and arms, reports feeling very weak and tired.  Objective: Vitals:   02/05/20 0204 02/05/20 0634 02/05/20 1003 02/05/20 1355  BP: 134/63 138/70 (!) 145/69 (!) 146/71  Pulse: (!) 117 (!) 117 (!) 115 (!) 114  Resp: _0 Temp: 99.2 F (37.3 C) 98.7 F (37.1 C) 97.9 F (36.6 C) 98.2 F (36.8 C)  TempSrc: Oral Oral Oral Oral  SpO2: 100% 100% 100% 100%  Weight:      Height:        Intake/Output Summary (Last 24 hours) at 02/05/2020 1357 Last data filed at 02/05/2020 1016 Gross per 24 hour  Intake 2728.13 ml  Output 1700 ml  Net 1028.13 ml   Filed Weights   01/30/20 2144  Weight: 134.7 kg    Examination:  General exam: Appears calm and comfortable , not in any distress. Respiratory system: Clear to auscultation. Respiratory effort normal. Cardiovascular system: S1 & S2 heard, RRR. No JVD, murmurs, rubs, gallops or clicks. No pedal edema. Gastrointestinal system: Abdomen is nondistended, soft and nontender. No organomegaly or masses  felt.  Normal bowel sounds heard. Central nervous system: Alert and oriented x 3 . No focal neurological deficits. Extremities: Bilateral lower extremity swelling. Skin: No rashes, lesions or ulcers Psychiatry: Judgement and insight appear normal. Mood & affect appropriate.     Data Reviewed: I have personally reviewed following labs and imaging studies  CBC: Recent Labs  Lab 02/01/20 0356 02/02/20 1644 02/03/20 0436 02/04/20 0310 02/05/20 0411  WBC 13.6* 15.9* 15.7* 13.9* 13.0*  NEUTROABS 9.4* 12.0* 11.6* 10.1* 8.8*  HGB 12.3 12.2 11.2* 10.8* 10.8*  HCT 38.7 37.8 34.6* 33.2* 33.4*  MCV 90.4 88.5 87.8 88.5 89.3  PLT 329 353 306  307 916   Basic Metabolic Panel: Recent Labs  Lab 02/01/20 0356 02/02/20 1644 02/03/20 0436 02/04/20 0310 02/05/20 0411  NA 137 133* 135 137 139  K 3.8 3.7 3.8 3.6 3.7  CL 104 103 103 105 105  CO2 22 19* _0 GLUCOSE 107* 92 93 103* 98  BUN _1 CREATININE 0.60 0.47 0.45 0.42* 0.45  CALCIUM 8.3* 8.2* 8.2* 8.0* 7.9*  MG 1.9 2.0 1.8 1.7 1.6*  PHOS 4.9* 3.5 3.8 3.4 4.0   GFR: Estimated Creatinine Clearance: 156 mL/min (by C-G formula based on SCr of 0.45 mg/dL). Liver Function Tests: Recent Labs  Lab 02/01/20 0356 02/02/20 1644 02/03/20 0436 02/04/20 0310 02/05/20 0411  AST 205* 162* 152* 151* 142*  ALT 62* 53* 48* 45* 44  ALKPHOS 41 36* 31* 31* 34*  BILITOT 0.2* 0.7 0.4 0.4 0.3  PROT 5.3* 5.4* 5.0* 4.9* 4.8*  ALBUMIN 2.4* 2.4* 2.3* 2.1* 2.1*   No results for input(s): LIPASE, AMYLASE in the last 168 hours. No results for input(s): AMMONIA in the last 168 hours. Coagulation Profile: Recent Labs  Lab 01/30/20 2011  INR 1.0   Cardiac Enzymes: Recent Labs  Lab 02/01/20 0356 02/02/20 1644 02/03/20 0436 02/04/20 0310 02/05/20 0411  CKTOTAL 9,011* 5,722* 4,949* 5,501* 5,629*   BNP (last 3 results) No results for input(s): PROBNP in the last 8760 hours. HbA1C: No results for input(s): HGBA1C in the last 72  hours. CBG: No results for input(s): GLUCAP in the last 168 hours. Lipid Profile: No results for input(s): CHOL, HDL, LDLCALC, TRIG, CHOLHDL, LDLDIRECT in the last 72 hours. Thyroid Function Tests: No results for input(s): TSH, T4TOTAL, FREET4, T3FREE, THYROIDAB in the last 72 hours. Anemia Panel: No results for input(s): VITAMINB12, FOLATE, FERRITIN, TIBC, IRON, RETICCTPCT in the last 72 hours. Sepsis Labs: Recent Labs  Lab 01/30/20 2011 01/30/20 2144 01/31/20 0329 01/31/20 0711 01/31/20 2136 02/01/20 0356  PROCALCITON  --   --  <0.10  --  <0.10 <0.10  LATICACIDVEN 2.2* 1.8 1.3 1.3  --   --     Recent Results (from the past 240 hour(s))  Urine culture     Status: Abnormal   Collection Time: 01/30/20  8:12 PM   Specimen: In/Out Cath Urine  Result Value Ref Range Status   Specimen Description   Final    IN/OUT CATH URINE Performed at [facility], [place] [address] [place], [place] [address]    Special Requests   Final    NONE Performed at [facility], [place] [address], [place], [place] [address]    Culture MULTIPLE SPECIES PRESENT, SUGGEST RECOLLECTION (A)  Final   Report Status 02/01/2020 FINAL  Final  Wound or Superficial Culture     Status: None   Collection Time: 01/30/20  9:33 PM   Specimen: Abscess; Wound  Result Value Ref Range Status   Specimen Description   Final    ABSCESS Performed at [place] [facility] [address], [place], [place] [address]    Special Requests   Final    NONE Performed at [facility], [place] [address], [place], [place] [address]    Gram Stain NO WBC SEEN NO ORGANISMS SEEN   Final   Culture   Final    NORMAL SKIN FLORA Performed at [facility], [place] [address], [place], [place] [address]    Report Status 02/02/2020 FINAL  Final  Resp Panel  by RT-PCR (Flu A&B, Covid) Nasopharyngeal Swab     Status: None   Collection Time: 01/30/20 11:08 PM   Specimen:  Nasopharyngeal Swab; Nasopharyngeal(NP) swabs in vial transport medium  Result Value Ref Range Status   SARS Coronavirus 2 by RT PCR NEGATIVE NEGATIVE Final    Comment: (NOTE) SARS-CoV-2 target nucleic acids are NOT DETECTED.  The SARS-CoV-2 RNA is generally detectable in upper respiratory specimens during the acute phase of infection. The lowest concentration of SARS-CoV-2 viral copies this assay can detect is 138 copies/mL. A negative result does not preclude SARS-Cov-2 infection and should not be used as the sole basis for treatment or other patient management decisions. A negative result may occur with  improper specimen collection/handling, submission of specimen other than nasopharyngeal swab, presence of viral mutation(s) within the areas targeted by this assay, and inadequate number of viral copies(<138 copies/mL). A negative result must be combined with clinical observations, patient history, and epidemiological information. The expected result is Negative.  Fact Sheet for Patients:  EntrepreneurPulse.com.au  Fact Sheet for Healthcare Providers:  IncredibleEmployment.be  This test is no t yet approved or cleared by the [place] FDA and  has been authorized for detection and/or diagnosis of SARS-CoV-2 by FDA under an Emergency Use Authorization (EUA). This EUA will remain  in effect (meaning this test can be used) for the duration of the COVID-19 declaration under Section 564(b)(1) of the Act, 21 U.S.C.section 360bbb-3(b)(1), unless the authorization is terminated  or revoked sooner.       Influenza A by PCR NEGATIVE NEGATIVE Final   Influenza B by PCR NEGATIVE NEGATIVE Final    Comment: (NOTE) The Xpert Xpress SARS-CoV-2/FLU/RSV plus assay is intended as an aid in the diagnosis of influenza from Nasopharyngeal swab specimens and should not be used as a sole basis for treatment. Nasal washings and aspirates are unacceptable for  Xpert Xpress SARS-CoV-2/FLU/RSV testing.  Fact Sheet for Patients: EntrepreneurPulse.com.au  Fact Sheet for Healthcare Providers: IncredibleEmployment.be  This test is not yet approved or cleared by the [place] FDA and has been authorized for detection and/or diagnosis of SARS-CoV-2 by FDA under an Emergency Use Authorization (EUA). This EUA will remain in effect (meaning this test can be used) for the duration of the COVID-19 declaration under Section 564(b)(1) of the Act, 21 U.S.C. section 360bbb-3(b)(1), unless the authorization is terminated or revoked.  Performed at [facility], [place] [address], [place], [place] [address]   Culture, blood (routine x 2)     Status: None (Preliminary result)   Collection Time: 01/31/20  3:29 AM   Specimen: BLOOD  Result Value Ref Range Status   Specimen Description   Final    BLOOD RIGHT ANTECUBITAL Performed at [facility] [address] [place], [place] [address]    Special Requests   Final    BOTTLES DRAWN AEROBIC ONLY Blood Culture adequate volume Performed at [facility] [address], [place], [place] [address]    Culture   Final    NO GROWTH 4 DAYS Performed at [facility], [place] [address] [place], [place] [address]    Report Status PENDING  Incomplete  Culture, blood (routine x 2)     Status: None (Preliminary result)   Collection Time: 01/31/20  3:29 AM   Specimen: BLOOD  Result Value Ref Range Status   Specimen Description   Final    BLOOD BLOOD RIGHT FOREARM Performed at [facility],  [place] [address], [place], [place] [address]    Special Requests   Final    BOTTLES DRAWN AEROBIC ONLY Blood Culture adequate volume Performed at [facility] [address], [place], [place] [address]    Culture   Final    NO GROWTH 4 DAYS Performed at [facility], [place] [address], [place], [place] [address]    Report Status PENDING  Incomplete     Radiology Studies: No results found.  Scheduled Meds: . Chlorhexidine Gluconate Cloth  6 each Topical Daily  . metroNIDAZOLE  500 mg Oral Q8H  . oxyCODONE  10 mg Oral Q12H  . polyethylene glycol  17 g Oral BID  . senna  1 tablet Oral QHS   Continuous Infusions: . azithromycin 500 mg (02/05/20 1043)  . cefTRIAXone (ROCEPHIN)  IV 2 g (02/04/20 1552)  . lactated ringers 150 mL/hr at 02/05/20 0607     LOS: 5 days    Time spent: 25 mins    Janee Ureste, MD [facility]   If 7PM-7AM, please contact night-coverage

## 2020-02-06 ENCOUNTER — Ambulatory Visit (HOSPITAL_COMMUNITY): Payer: Medicaid Other | Admitting: Physical Therapy

## 2020-02-06 DIAGNOSIS — L0231 Cutaneous abscess of buttock: Secondary | ICD-10-CM | POA: Diagnosis not present

## 2020-02-06 DIAGNOSIS — A419 Sepsis, unspecified organism: Secondary | ICD-10-CM | POA: Diagnosis not present

## 2020-02-06 DIAGNOSIS — M609 Myositis, unspecified: Secondary | ICD-10-CM | POA: Diagnosis not present

## 2020-02-06 LAB — CULTURE, BLOOD (ROUTINE X 2)
Culture: NO GROWTH
Culture: NO GROWTH
Special Requests: ADEQUATE
Special Requests: ADEQUATE

## 2020-02-06 LAB — CBC WITH DIFFERENTIAL/PLATELET
Abs Immature Granulocytes: 0.33 10*3/uL — ABNORMAL HIGH (ref 0.00–0.07)
Basophils Absolute: 0.1 10*3/uL (ref 0.0–0.1)
Basophils Relative: 0 %
Eosinophils Absolute: 0.1 10*3/uL (ref 0.0–0.5)
Eosinophils Relative: 1 %
HCT: 33.1 % — ABNORMAL LOW (ref 36.0–46.0)
Hemoglobin: 10.5 g/dL — ABNORMAL LOW (ref 12.0–15.0)
Immature Granulocytes: 3 %
Lymphocytes Relative: 13 %
Lymphs Abs: 1.7 10*3/uL (ref 0.7–4.0)
MCH: 28.5 pg (ref 26.0–34.0)
MCHC: 31.7 g/dL (ref 30.0–36.0)
MCV: 89.7 fL (ref 80.0–100.0)
Monocytes Absolute: 1.3 10*3/uL — ABNORMAL HIGH (ref 0.1–1.0)
Monocytes Relative: 10 %
Neutro Abs: 9.9 10*3/uL — ABNORMAL HIGH (ref 1.7–7.7)
Neutrophils Relative %: 73 %
Platelets: 326 10*3/uL (ref 150–400)
RBC: 3.69 MIL/uL — ABNORMAL LOW (ref 3.87–5.11)
RDW: 15.9 % — ABNORMAL HIGH (ref 11.5–15.5)
WBC: 13.4 10*3/uL — ABNORMAL HIGH (ref 4.0–10.5)
nRBC: 0 % (ref 0.0–0.2)

## 2020-02-06 LAB — COMPREHENSIVE METABOLIC PANEL
ALT: 47 U/L — ABNORMAL HIGH (ref 0–44)
AST: 143 U/L — ABNORMAL HIGH (ref 15–41)
Albumin: 2.2 g/dL — ABNORMAL LOW (ref 3.5–5.0)
Alkaline Phosphatase: 32 U/L — ABNORMAL LOW (ref 38–126)
Anion gap: 9 (ref 5–15)
BUN: 9 mg/dL (ref 6–20)
CO2: 25 mmol/L (ref 22–32)
Calcium: 8 mg/dL — ABNORMAL LOW (ref 8.9–10.3)
Chloride: 103 mmol/L (ref 98–111)
Creatinine, Ser: 0.48 mg/dL (ref 0.44–1.00)
GFR, Estimated: >60 mL/min (ref >60)
Glucose, Bld: 105 mg/dL — ABNORMAL HIGH (ref 70–99)
Potassium: 3.7 mmol/L (ref 3.5–5.1)
Sodium: 137 mmol/L (ref 135–145)
Total Bilirubin: 0.2 mg/dL — ABNORMAL LOW (ref 0.3–1.2)
Total Protein: 4.9 g/dL — ABNORMAL LOW (ref 6.5–8.1)

## 2020-02-06 LAB — ALDOLASE: Aldolase: 24.6 U/L — ABNORMAL HIGH (ref 3.3–10.3)

## 2020-02-06 LAB — CK: Total CK: 4879 U/L — ABNORMAL HIGH (ref 38–234)

## 2020-02-06 LAB — MAGNESIUM: Magnesium: 1.7 mg/dL (ref 1.7–2.4)

## 2020-02-06 LAB — PHOSPHORUS: Phosphorus: 3.7 mg/dL (ref 2.5–4.6)

## 2020-02-06 MED ORDER — METOPROLOL TARTRATE 12.5 MG HALF TABLET
12.5000 mg | ORAL_TABLET | Freq: Two times a day (BID) | ORAL | Status: DC
Start: 2020-02-06 — End: 2020-02-07
  Administered 2020-02-06 – 2020-02-07 (×3): 12.5 mg via ORAL
  Filled 2020-02-06 (×3): qty 1

## 2020-02-06 MED ORDER — HYDROCORTISONE 0.5 % EX CREA
TOPICAL_CREAM | Freq: Every day | CUTANEOUS | Status: DC | PRN
  Administered 2020-02-06: 1 via TOPICAL
  Filled 2020-02-06: qty 28.35

## 2020-02-06 NOTE — Progress Notes (Signed)
PROGRESS NOTE    Haley Sandoval  BWG:[id number] DOB: [birth year] DOA: 01/30/2020 PCP: Vevelyn Francois, NP   Brief Narrative:  This 19 years old morbidly obese female with PMH of rhabdomyolysis, PID, recent admission at [facility] for rhabdomyolysis and possible PID,  initially CK levels were 31,000,  LFTs were elevated. GI was consulted,  felt that the patient's LFT are elevated because of rhabdomyolysis.  CT abdomen concerning for pelvic inflammatory disease.  Patient was empirically started on ceftriaxone, doxycycline and metronidazole.  Patient was also started on steroids for rhabdomyolysis.  CK level trending down.  Patient continued to have pain and has developed swelling of the right buttock area concerning for abscess. Patient was found to have small abscess to the right buttock which was drained by ER physician and started on empiric antibiotics.  Assessment & Plan:   Principal Problem:   Sepsis ([place]) Active Problems:   Rhabdomyolysis   Elevated liver enzymes   Obesity, Class III, BMI 40-49.9 (morbid obesity) (HCC)   Cellulitis   Morbidly obese (HCC)   Severe sepsis could be secondary to PID and right buttock Abscess. Patient met sepsis criteria on admission.  HR> 90,> 12 K, lactic acid> 2 multiple points of infection to include abscess on buttocks, PID confirmed on CT scan. Continue current antibiotics. Continue Lactated Ringer's 156m/hr. Trend lactic acid/procalcitonin.  CT pelvis shows persistent pelvic inflammatory disease with some improvement.  Dr. PIlda Basseton-call OB/GYN was consulted , recommended transvaginal ultrasound  and chlamydia and gonorrhea probe.   Chlamydia and gonorrhea is negative.  Transvaginal ultrasound unremarkable.  Sepsis physiology Improving  Rhabdomyolysis >> Improving. Found to have elevated CK levels Continue aggressive IV hydration CK levels are trending down.  Myositis Patient signs and symptoms are consistent with  myositis.  Patient only took 3 days of steroids. Patient requires muscle biopsy however would not be safe until sepsis and rhabdomyolysis resolved. No family history of autoimmune disease. 12/12 Sent myositis panel, So far negative. 12/16  General surgery consulted for muscle biopsy. 12/17  NPO midnight on _0 0 mg 250 mL/hr over 60  Minutes Intravenous Daily 02/02/20 1335     02/02/20 1100  metroNIDAZOLE (FLAGYL) tablet 500 mg        500 mg Oral Every 8 hours 02/02/20 1012     02/02/20 1100  doxycycline (VIBRA-TABS) tablet 100 mg  Status:  Discontinued        100 mg Oral Every 12 hours 02/02/20 1012 02/02/20 1335   01/31/20 1200  vancomycin (VANCOREADY) IVPB 1250 mg/250 mL  Status:  Discontinued        1,250 mg 166.7 mL/hr over 90 Minutes  Intravenous Every 8 hours 01/31/20 0230 01/31/20 1052   01/31/20 1200  vancomycin (VANCOREADY) IVPB 1500 mg/300 mL  Status:  Discontinued        1,500 mg 150 mL/hr over 120 Minutes Intravenous 2 times daily 01/31/20 1052 02/02/20 1012   01/31/20 1000  ceFEPIme (MAXIPIME) 2 g in sodium chloride 0.9 % 100 mL IVPB  Status:  Discontinued        2 g 200 mL/hr over 30 Minutes Intravenous Every 8 hours 01/31/20 0226 02/02/20 1012   01/31/20 0330  vancomycin (VANCOREADY) IVPB 2000 mg/400 mL        2,000 mg 200 mL/hr over 120 Minutes Intravenous  Once 01/31/20 0146 01/31/20 0517   01/31/20 0300  ceFEPIme (MAXIPIME) 2 g in sodium chloride 0.9 % 100 mL IVPB        2 g 200 mL/hr over 30 Minutes Intravenous  Once 01/31/20 0146 01/31/20 0247   01/30/20 2300  ceFAZolin (ANCEF) IVPB 1 g/50 mL premix        1 g 100 mL/hr over 30 Minutes Intravenous  Once 01/30/20 2258 01/30/20 2332      Subjective: Patient was seen and examined at bedside.  No overnight events.   She continues to have pain in the thighs and arms, Her abscess site is improving, denies fever, chills. She states pain is controlled with pain medications.  Objective: Vitals:   02/06/20 0642 02/06/20 0919 02/06/20 1000 02/06/20 1407  BP: (!) 148/45 (!) 146/79 (!) 146/68 (!) 137/56  Pulse: (!) 116 (!) 120 (!) 117 (!) 111  Resp: _0 Temp: 98.7 F (37.1 C) 98.9 F (37.2 C) (!) 97.3 F (36.3 C) 98.5 F (36.9 C)  TempSrc: Oral  Oral Oral  SpO2: 100% 100% 100% 100%  Weight:      Height:        Intake/Output Summary (Last 24 hours) at 02/06/2020 1459 Last data filed at 02/06/2020 1426 Gross per 24 hour  Intake 4248.24 ml  Output 850 ml  Net 3398.24 ml   Filed Weights   01/30/20 2144  Weight: 134.7 kg    Examination:  General exam: Appears calm and comfortable , not in any distress. Respiratory system: Clear to auscultation. Respiratory effort normal. Cardiovascular system: S1 & S2 heard, RRR. No JVD, murmurs,  rubs, gallops or clicks. No pedal edema. Gastrointestinal system: Abdomen is nondistended, soft and nontender. No organomegaly or masses felt.  Normal bowel sounds heard. Central nervous system: Alert and oriented x 3 . No focal neurological deficits. Extremities: Bilateral lower extremity swelling. Skin:  Abscess site , dry, no erythema , No rashes, lesions or ulcers Psychiatry: Judgement and insight appear normal. Mood & affect appropriate.     Data Reviewed: I have personally reviewed following labs and imaging studies  CBC: Recent Labs  Lab 02/02/20 1644 02/03/20 0436 02/04/20 0310 02/05/20 0411 02/06/20 0449  WBC 15.9* 15.7* 13.9* 13.0* 13.4*  NEUTROABS  12.0* 11.6* 10.1* 8.8* 9.9*  HGB 12.2 11.2* 10.8* 10.8* 10.5*  HCT 37.8 34.6* 33.2* 33.4* 33.1*  MCV 88.5 87.8 88.5 89.3 89.7  PLT 353 306 307 319 626   Basic Metabolic Panel: Recent Labs  Lab 02/02/20 1644 02/03/20 0436 02/04/20 0310 02/05/20 0411 02/06/20 0449  NA 133* 135 137 139 137  K 3.7 3.8 3.6 3.7 3.7  CL 103 103 105 105 103  CO2 19* _0 GLUCOSE 92 93 103* 98 105*  BUN _1 CREATININE 0.47 0.45 0.42* 0.45 0.48  CALCIUM 8.2* 8.2* 8.0* 7.9* 8.0*  MG 2.0 1.8 1.7 1.6* 1.7  PHOS 3.5 3.8 3.4 4.0 3.7   GFR: Estimated Creatinine Clearance: 156 mL/min (by C-G formula based on SCr of 0.48 mg/dL). Liver Function Tests: Recent Labs  Lab 02/02/20 1644 02/03/20 0436 02/04/20 0310 02/05/20 0411 02/06/20 0449  AST 162* 152* 151* 142* 143*  ALT 53* 48* 45* 44 47*  ALKPHOS 36* 31* 31* 34* 32*  BILITOT 0.7 0.4 0.4 0.3 0.2*  PROT 5.4* 5.0* 4.9* 4.8* 4.9*  ALBUMIN 2.4* 2.3* 2.1* 2.1* 2.2*   No results for input(s): LIPASE, AMYLASE in the last 168 hours. No results for input(s): AMMONIA in the last 168 hours. Coagulation Profile: Recent Labs  Lab 01/30/20 2011  INR 1.0   Cardiac Enzymes: Recent Labs  Lab 02/02/20 1644 02/03/20 0436 02/04/20 0310 02/05/20 0411 02/06/20 0449  CKTOTAL  5,722* 4,949* 5,501* 5,629* 4,879*   BNP (last 3 results) No results for input(s): PROBNP in the last 8760 hours. HbA1C: No results for input(s): HGBA1C in the last 72 hours. CBG: No results for input(s): GLUCAP in the last 168 hours. Lipid Profile: No results for input(s): CHOL, HDL, LDLCALC, TRIG, CHOLHDL, LDLDIRECT in the last 72 hours. Thyroid Function Tests: No results for input(s): TSH, T4TOTAL, FREET4, T3FREE, THYROIDAB in the last 72 hours. Anemia Panel: No results for input(s): VITAMINB12, FOLATE, FERRITIN, TIBC, IRON, RETICCTPCT in the last 72 hours. Sepsis Labs: Recent Labs  Lab 01/30/20 2011 01/30/20 2144 01/31/20 0329 01/31/20 0711 01/31/20 2136 02/01/20 0356  PROCALCITON  --   --  <0.10  --  <0.10 <0.10  LATICACIDVEN 2.2* 1.8 1.3 1.3  --   --     Recent Results (from the past 240 hour(s))  Urine culture     Status: Abnormal   Collection Time: 01/30/20  8:12 PM   Specimen: In/Out Cath Urine  Result Value Ref Range Status   Specimen Description   Final    IN/OUT CATH URINE Performed at [facility], [place] [address] [place], [place] [address]    Special Requests   Final    NONE Performed at [facility], [place] [address], [place], [place] [address]    Culture MULTIPLE SPECIES PRESENT, SUGGEST RECOLLECTION (A)  Final   Report Status 02/01/2020 FINAL  Final  Wound or Superficial Culture     Status: None   Collection Time: 01/30/20  9:33 PM   Specimen: Abscess; Wound  Result Value Ref Range Status   Specimen Description   Final    ABSCESS Performed at [place] [address] [place], [place] [address]    Special Requests   Final    NONE Performed at [facility], [place] [address], [place], [place] [address]    Gram Stain NO WBC SEEN NO ORGANISMS SEEN   Final   Culture   Final  NORMAL SKIN FLORA Performed at [facility], [place] [address], [place], [place] [address]     Report Status 02/02/2020 FINAL  Final  Resp Panel by RT-PCR (Flu A&B, Covid) Nasopharyngeal Swab     Status: None   Collection Time: 01/30/20 11:08 PM   Specimen: Nasopharyngeal Swab; Nasopharyngeal(NP) swabs in vial transport medium  Result Value Ref Range Status   SARS Coronavirus 2 by RT PCR NEGATIVE NEGATIVE Final    Comment: (NOTE) SARS-CoV-2 target nucleic acids are NOT DETECTED.  The SARS-CoV-2 RNA is generally detectable in upper respiratory specimens during the acute phase of infection. The lowest concentration of SARS-CoV-2 viral copies this assay can detect is 138 copies/mL. A negative result does not preclude SARS-Cov-2 infection and should not be used as the sole basis for treatment or other patient management decisions. A negative result may occur with  improper specimen collection/handling, submission of specimen other than nasopharyngeal swab, presence of viral mutation(s) within the areas targeted by this assay, and inadequate number of viral copies(<138 copies/mL). A negative result must be combined with clinical observations, patient history, and epidemiological information. The expected result is Negative.  Fact Sheet for Patients:  EntrepreneurPulse.com.au  Fact Sheet for Healthcare Providers:  IncredibleEmployment.be  This test is no t yet approved or cleared by the [place] FDA and  has been authorized for detection and/or diagnosis of SARS-CoV-2 by FDA under an Emergency Use Authorization (EUA). This EUA will remain  in effect (meaning this test can be used) for the duration of the COVID-19 declaration under Section 564(b)(1) of the Act, 21 U.S.C.section 360bbb-3(b)(1), unless the authorization is terminated  or revoked sooner.       Influenza A by PCR NEGATIVE NEGATIVE Final   Influenza B by PCR NEGATIVE NEGATIVE Final    Comment: (NOTE) The Xpert Xpress SARS-CoV-2/FLU/RSV plus assay is intended as an aid in  the diagnosis of influenza from Nasopharyngeal swab specimens and should not be used as a sole basis for treatment. Nasal washings and aspirates are unacceptable for Xpert Xpress SARS-CoV-2/FLU/RSV testing.  Fact Sheet for Patients: EntrepreneurPulse.com.au  Fact Sheet for Healthcare Providers: IncredibleEmployment.be  This test is not yet approved or cleared by the [place] FDA and has been authorized for detection and/or diagnosis of SARS-CoV-2 by FDA under an Emergency Use Authorization (EUA). This EUA will remain in effect (meaning this test can be used) for the duration of the COVID-19 declaration under Section 564(b)(1) of the Act, 21 U.S.C. section 360bbb-3(b)(1), unless the authorization is terminated or revoked.  Performed at [facility], [place] [address], [place], [place] [address]   Culture, blood (routine x 2)     Status: None   Collection Time: 01/31/20  3:29 AM   Specimen: BLOOD  Result Value Ref Range Status   Specimen Description   Final    BLOOD RIGHT ANTECUBITAL Performed at [place] [address] [place], [place] [address]    Special Requests   Final    BOTTLES DRAWN AEROBIC ONLY Blood Culture adequate volume Performed at [place] [address], [place], [place] [address]    Culture   Final    NO GROWTH 6 DAYS Performed at [facility], [place] [address], [place], [place] [address]    Report Status 02/06/2020 FINAL  Final  Culture, blood (routine x 2)     Status: None   Collection Time: 01/31/20  3:29 AM   Specimen: BLOOD  Result Value Ref Range Status   Specimen Description   Final    BLOOD BLOOD RIGHT FOREARM Performed at [place] [address], [place], [place] [address]    Special Requests   Final    BOTTLES DRAWN AEROBIC ONLY Blood Culture adequate volume Performed at [place]  [address], [place], [place] [address]    Culture   Final    NO GROWTH 6 DAYS Performed at [facility], [place] [address] [place], [place] [address]    Report Status 02/06/2020 FINAL  Final     Radiology Studies: No results found.  Scheduled Meds: . Chlorhexidine Gluconate Cloth  6 each Topical Daily  . metoprolol tartrate  12.5 mg Oral BID  . metroNIDAZOLE  500 mg Oral Q8H  . oxyCODONE  10 mg Oral Q12H  . polyethylene glycol  17 g Oral BID  . senna  1 tablet Oral QHS   Continuous Infusions: . azithromycin 500 mg (02/06/20 1010)  . cefTRIAXone (ROCEPHIN)  IV 2 g (02/05/20 1554)  . lactated ringers 150 mL/hr at 02/06/20 0723     LOS: 6 days    Time spent: 25 mins    Jayceion Lisenby, MD [facility]   If 7PM-7AM, please contact night-coverage

## 2020-02-06 NOTE — Consult Note (Signed)
Consult Note  Haley Sandoval 09-Aug-2000  [id number].    Requesting MD: Lucianne Muss Chief Complaint/Reason for Consult: myositis   HPI:  Patient is a 19 year old female who presented to [facility] after recently being admitted to [facility] for rhabdomyolysis and possible PID. Found to have elevated CK level and LFTs. Treated for rhabdomyolysis with steroids and PID treated with abx. Patient continued to have pain and swelling and specifically noted area of swelling to right buttock and came to the ED 01/31/20. She reported some subjective fever, chills. She underwent bedside I&D in the ED 12/11 and cultures from this grew normal skin flora. PMH otherwise significant for obesity. She denies personal or family history of autoimmune disease. NKDA.   General surgery asked to see for possible muscle biopsy.   ROS: Review of Systems  Constitutional: Positive for chills, fever and malaise/fatigue.       Generalized swelling   Respiratory: Negative for shortness of breath and wheezing.   Cardiovascular: Negative for chest pain and palpitations.  Gastrointestinal: Negative for abdominal pain, constipation, diarrhea, nausea and vomiting.  Genitourinary: Negative for dysuria, frequency and urgency.  Musculoskeletal: Positive for myalgias.  All other systems reviewed and are negative.   Family History  Problem Relation Age of Onset  . Hypertension Mother   . Colon cancer Neg Hx   . Colon polyps Neg Hx   . Liver disease Neg Hx   . Sickle cell anemia Neg Hx     Past Medical History:  Diagnosis Date  . Seasonal allergies     Past Surgical History:  Procedure Laterality Date  . WISDOM TOOTH EXTRACTION      Social History:  reports that she has never smoked. She has never used smokeless tobacco. She reports previous alcohol use. She reports previous drug use.  Allergies: No Known Allergies  Medications Prior to Admission  Medication Sig Dispense Refill  . medroxyPROGESTERone (DEPO-PROVERA)  150 MG/ML injection Inject 150 mg into the muscle every 3 (three) months.    . predniSONE (DELTASONE) 10 MG tablet Take 6 tablets (60 mg total) by mouth daily for 7 days, THEN 5 tablets (50 mg total) daily for 7 days, THEN 4 tablets (40 mg total) daily for 7 days, THEN 3.5 tablets (35 mg total) daily for 7 days, THEN 3 tablets (30 mg total) daily for 7 days, THEN 2.5 tablets (25 mg total) daily for 7 days, THEN 2 tablets (20 mg total) daily for 7 days, THEN 1.5 tablets (15 mg total) daily for 7 days, THEN 1 tablet (10 mg total) daily for 7 days, THEN 0.5 tablets (5 mg total) daily for 7 days. 203 tablet 0  . methocarbamol (ROBAXIN) 500 MG tablet Take 1 tablet (500 mg total) by mouth every 6 (six) hours as needed for muscle spasms. (Patient not taking: Reported on 01/31/2020) 30 tablet 0  . oxyCODONE (OXY IR/ROXICODONE) 5 MG immediate release tablet Take 1 tablet (5 mg total) by mouth every 4 (four) hours as needed for moderate pain or severe pain. (Patient not taking: Reported on 01/31/2020) 20 tablet 0    Blood pressure (!) 137/56, pulse (!) 111, temperature (!) 97.3 F (36.3 C), temperature source Oral, resp. rate 17, height 5\' 5"  (1.651 m), weight 134.7 kg, SpO2 100 %. Physical Exam:  General: pleasant, WD, morbidly obese female who is laying in bed in NAD HEENT: head is normocephalic, atraumatic.  Sclera are noninjected.  PERRL.  Ears and nose without any masses  or lesions.  Mouth is pink and moist Heart: regular, rate, and rhythm.  Normal s1,s2. No obvious murmurs, gallops, or rubs noted.  Palpable radial and pedal pulses bilaterally Lungs: CTAB, no wheezes, rhonchi, or rales noted.  Respiratory effort nonlabored Abd: soft, NT, ND, +BS, no masses, hernias, or organomegaly GU: R buttock with dime sized area of induration but no other signs of cellulitis MS: generalized edema of extremities Skin: warm and dry with no masses, lesions, or rashes Neuro: Cranial nerves 2-12 grossly intact, sensation  is normal throughout Psych: A&Ox3 with an appropriate affect.   Results for orders placed or performed during the hospital encounter of 01/30/20 (from the past 48 hour(s))  Aldolase     Status: Abnormal   Collection Time: 02/05/20  4:11 AM  Result Value Ref Range   Aldolase 24.6 (H) 3.3 - 10.3 U/L    Comment: (NOTE) Performed At: [facility] [address] [place], [place] [id number] Jolene Schimke MD ZJ:[id number]   CBC with Differential/Platelet     Status: Abnormal   Collection Time: 02/05/20  4:11 AM  Result Value Ref Range   WBC 13.0 (H) 4.0 - 10.5 K/uL   RBC 3.74 (L) 3.87 - 5.11 MIL/uL   Hemoglobin 10.8 (L) 12.0 - 15.0 g/dL   HCT 17.5 (L) 10.2 - 58.5 %   MCV 89.3 80.0 - 100.0 fL   MCH 28.9 26.0 - 34.0 pg   MCHC 32.3 30.0 - 36.0 g/dL   RDW 27.7 (H) 82.4 - 23.5 %   Platelets 319 150 - 400 K/uL   nRBC 0.0 0.0 - 0.2 %   Neutrophils Relative % 68 %   Neutro Abs 8.8 (H) 1.7 - 7.7 K/uL   Lymphocytes Relative 16 %   Lymphs Abs 2.1 0.7 - 4.0 K/uL   Monocytes Relative 11 %   Monocytes Absolute 1.5 (H) 0.1 - 1.0 K/uL   Eosinophils Relative 1 %   Eosinophils Absolute 0.1 0.0 - 0.5 K/uL   Basophils Relative 1 %   Basophils Absolute 0.1 0.0 - 0.1 K/uL   Immature Granulocytes 3 %   Abs Immature Granulocytes 0.43 (H) 0.00 - 0.07 K/uL    Comment: Performed at [facility], [address], [place], [place] [address]  CK     Status: Abnormal   Collection Time: 02/05/20  4:11 AM  Result Value Ref Range   Total CK 5,629 (H) 38 - 234 U/L    Comment: RESULTS CONFIRMED BY MANUAL DILUTION Performed at [facility], [address], [place], [place] [address]   Comprehensive metabolic panel     Status: Abnormal   Collection Time: 02/05/20  4:11 AM  Result Value Ref Range   Sodium 139 135 - 145 mmol/L   Potassium 3.7 3.5 - 5.1 mmol/L   Chloride 105 98 - 111 mmol/L   CO2 25 22 - 32 mmol/L   Glucose, Bld 98 70 - 99 mg/dL    Comment:  Glucose reference range applies only to samples taken after fasting for at least 8 hours.   BUN 8 6 - 20 mg/dL   Creatinine, Ser 0.86 0.44 - 1.00 mg/dL   Calcium 7.9 (L) 8.9 - 10.3 mg/dL   Total Protein 4.8 (L) 6.5 - 8.1 g/dL   Albumin 2.1 (L) 3.5 - 5.0 g/dL   AST 761 (H) 15 - 41 U/L   ALT 44 0 - 44 U/L   Alkaline Phosphatase 34 (L) 38 - 126 U/L  Total Bilirubin 0.3 0.3 - 1.2 mg/dL   GFR, Estimated >44 >03 mL/min    Comment: (NOTE) Calculated using the CKD-EPI Creatinine Equation (2021)    Anion gap 9 5 - 15    Comment: Performed at [facility], [address] [place], [place] [address]  Magnesium     Status: Abnormal   Collection Time: 02/05/20  4:11 AM  Result Value Ref Range   Magnesium 1.6 (L) 1.7 - 2.4 mg/dL    Comment: Performed at [facility], [address] [place], [place] [address]  Phosphorus     Status: None   Collection Time: 02/05/20  4:11 AM  Result Value Ref Range   Phosphorus 4.0 2.5 - 4.6 mg/dL    Comment: Performed at [facility], [address], [place], [place] [address]  CBC with Differential/Platelet     Status: Abnormal   Collection Time: 02/06/20  4:49 AM  Result Value Ref Range   WBC 13.4 (H) 4.0 - 10.5 K/uL   RBC 3.69 (L) 3.87 - 5.11 MIL/uL   Hemoglobin 10.5 (L) 12.0 - 15.0 g/dL   HCT 32.9 (L) 51.8 - 84.1 %   MCV 89.7 80.0 - 100.0 fL   MCH 28.5 26.0 - 34.0 pg   MCHC 31.7 30.0 - 36.0 g/dL   RDW 66.0 (H) 63.0 - 16.0 %   Platelets 326 150 - 400 K/uL   nRBC 0.0 0.0 - 0.2 %   Neutrophils Relative % 73 %   Neutro Abs 9.9 (H) 1.7 - 7.7 K/uL   Lymphocytes Relative 13 %   Lymphs Abs 1.7 0.7 - 4.0 K/uL   Monocytes Relative 10 %   Monocytes Absolute 1.3 (H) 0.1 - 1.0 K/uL   Eosinophils Relative 1 %   Eosinophils Absolute 0.1 0.0 - 0.5 K/uL   Basophils Relative 0 %   Basophils Absolute 0.1 0.0 - 0.1 K/uL   Immature Granulocytes 3 %   Abs Immature Granulocytes 0.33 (H) 0.00 - 0.07 K/uL     Comment: Performed at [facility], [address] [place], [place] [address]  CK     Status: Abnormal   Collection Time: 02/06/20  4:49 AM  Result Value Ref Range   Total CK 4,879 (H) 38 - 234 U/L    Comment: RESULTS CONFIRMED BY MANUAL DILUTION Performed at [facility], [address], [place], [place] [address]   Comprehensive metabolic panel     Status: Abnormal   Collection Time: 02/06/20  4:49 AM  Result Value Ref Range   Sodium 137 135 - 145 mmol/L   Potassium 3.7 3.5 - 5.1 mmol/L   Chloride 103 98 - 111 mmol/L   CO2 25 22 - 32 mmol/L   Glucose, Bld 105 (H) 70 - 99 mg/dL    Comment: Glucose reference range applies only to samples taken after fasting for at least 8 hours.   BUN 9 6 - 20 mg/dL   Creatinine, Ser 2.20 0.44 - 1.00 mg/dL   Calcium 8.0 (L) 8.9 - 10.3 mg/dL   Total Protein 4.9 (L) 6.5 - 8.1 g/dL   Albumin 2.2 (L) 3.5 - 5.0 g/dL   AST 254 (H) 15 - 41 U/L   ALT 47 (H) 0 - 44 U/L   Alkaline Phosphatase 32 (L) 38 - 126 U/L   Total Bilirubin 0.2 (L) 0.3 - 1.2 mg/dL   GFR, Estimated >27 >06 mL/min    Comment: (NOTE) Calculated  using the CKD-EPI Creatinine Equation (2021)    Anion gap 9 5 - 15    Comment: Performed at [facility], [address] [place], [place] [address]  Magnesium     Status: None   Collection Time: 02/06/20  4:49 AM  Result Value Ref Range   Magnesium 1.7 1.7 - 2.4 mg/dL    Comment: Performed at [facility], [address] [place], [place] [address]  Phosphorus     Status: None   Collection Time: 02/06/20  4:49 AM  Result Value Ref Range   Phosphorus 3.7 2.5 - 4.6 mg/dL    Comment: Performed at [facility], [address] [place], [place] [address]   No results found.    Assessment/Plan Hx of PID and rhabdomyolysis Elevated LFTs Morbid obesity - BMI 49.42  R gluteal abscess s/p bedside I&D 01/31/20 - stable, no further I&D needed,  continue current care Myositis - unsure etiology, if needed muscle biopsy can be done but would have to be Monday because specimen has to be sent out by a certain time - if patient remains admitted, we can likely attempt muscle biopsy on Monday and we will follow up then. Recommend making patient NPO after midnight Sunday  Juliet RudeKelly R Kinzi Frediani, [place] JerseyPA-C [facility] 02/06/2020, 2:15 PM Please see Amion for pager number during day hours 7:00am-4:30pm

## 2020-02-07 DIAGNOSIS — A419 Sepsis, unspecified organism: Secondary | ICD-10-CM | POA: Diagnosis not present

## 2020-02-07 LAB — BASIC METABOLIC PANEL
Anion gap: 10 (ref 5–15)
BUN: 7 mg/dL (ref 6–20)
CO2: 26 mmol/L (ref 22–32)
Calcium: 8.2 mg/dL — ABNORMAL LOW (ref 8.9–10.3)
Chloride: 104 mmol/L (ref 98–111)
Creatinine, Ser: 0.47 mg/dL (ref 0.44–1.00)
GFR, Estimated: >60 mL/min (ref >60)
Glucose, Bld: 95 mg/dL (ref 70–99)
Potassium: 3.6 mmol/L (ref 3.5–5.1)
Sodium: 140 mmol/L (ref 135–145)

## 2020-02-07 LAB — CK: Total CK: 2155 U/L — ABNORMAL HIGH (ref 38–234)

## 2020-02-07 LAB — CBC
HCT: 32.1 % — ABNORMAL LOW (ref 36.0–46.0)
Hemoglobin: 10.3 g/dL — ABNORMAL LOW (ref 12.0–15.0)
MCH: 28.8 pg (ref 26.0–34.0)
MCHC: 32.1 g/dL (ref 30.0–36.0)
MCV: 89.7 fL (ref 80.0–100.0)
Platelets: 324 10*3/uL (ref 150–400)
RBC: 3.58 MIL/uL — ABNORMAL LOW (ref 3.87–5.11)
RDW: 15.9 % — ABNORMAL HIGH (ref 11.5–15.5)
WBC: 13 10*3/uL — ABNORMAL HIGH (ref 4.0–10.5)
nRBC: 0 % (ref 0.0–0.2)

## 2020-02-07 LAB — PHOSPHORUS: Phosphorus: 3.9 mg/dL (ref 2.5–4.6)

## 2020-02-07 LAB — ALDOLASE: Aldolase: 25.5 U/L — ABNORMAL HIGH (ref 3.3–10.3)

## 2020-02-07 LAB — MAGNESIUM: Magnesium: 1.7 mg/dL (ref 1.7–2.4)

## 2020-02-07 MED ORDER — CHLORHEXIDINE GLUCONATE CLOTH 2 % EX PADS
6.0000 | MEDICATED_PAD | Freq: Once | CUTANEOUS | Status: AC
  Administered 2020-02-09: 08:00:00 6 via TOPICAL

## 2020-02-07 MED ORDER — ACETAMINOPHEN 500 MG PO TABS: 1000.0000 mg | ORAL_TABLET | ORAL | Status: AC

## 2020-02-07 MED ORDER — METOPROLOL TARTRATE 25 MG PO TABS
25.0000 mg | ORAL_TABLET | Freq: Two times a day (BID) | ORAL | Status: DC
Start: 2020-02-07 — End: 2020-02-16
  Administered 2020-02-07 – 2020-02-16 (×18): 25 mg via ORAL
  Filled 2020-02-07 (×18): qty 1

## 2020-02-07 MED ORDER — DEXTROSE 5 % IV SOLN
3.0000 g | INTRAVENOUS | Status: AC
  Filled 2020-02-07: qty 3000

## 2020-02-07 MED ORDER — CHLORHEXIDINE GLUCONATE CLOTH 2 % EX PADS
6.0000 | MEDICATED_PAD | Freq: Once | CUTANEOUS | Status: AC
  Administered 2020-02-07: 13:00:00 6 via TOPICAL

## 2020-02-07 NOTE — Progress Notes (Signed)
Tentative plans for muscle biopsy Monday depending on OR schedule

## 2020-02-07 NOTE — Plan of Care (Signed)
  Problem: Clinical Measurements: Goal: Respiratory complications will improve Outcome: Progressing   Problem: Clinical Measurements: Goal: Cardiovascular complication will be avoided Outcome: Progressing   Problem: Elimination: Goal: Will not experience complications related to bowel motility Outcome: Progressing   Problem: Skin Integrity: Goal: Risk for impaired skin integrity will decrease Outcome: Progressing   

## 2020-02-07 NOTE — TOC Progression Note (Signed)
Transition of Care [facility]) - Progression Note    Patient Details  Name: Haley Sandoval MRN: [id number] Date of Birth: 04-15-2000  Transition of Care [facility]) CM/SW Contact  Armanda Heritage, RN Phone Number: 02/07/2020, 3:16 PM  Clinical Narrative:    CM noted patient may benefit from [facility] services at time of discharge, has a wound that requires wound care and there is a possibility of IV antibiotics at home.  [facility] is able to provide [facility] services, referral made.  MD please place orders.   Expected Discharge Plan: Home w Home Health Services Barriers to Discharge: Continued Medical Work up  Expected Discharge Plan and Services Expected Discharge Plan: Home w Home Health Services In-house Referral: Clinical Social Work     Living arrangements for the past 2 months: Single Family Home                           HH Arranged: RN HH Agency: [facility] Date HH Agency Contacted: 02/07/20 Time HH Agency Contacted: 1516 Representative spoke with at [facility] Agency: Amy   Social Determinants of Health (SDOH) Interventions    Readmission Risk Interventions Readmission Risk Prevention Plan 02/04/2020  Transportation Screening Complete  PCP or Specialist Appt within 5-7 Days Complete  Home Care Screening Complete  Medication Review (RN CM) Complete  Some recent data might be hidden

## 2020-02-07 NOTE — Progress Notes (Signed)
PROGRESS NOTE    Haley Sandoval  FVC:[id number] DOB: 10/31/2000 DOA: 01/30/2020 PCP: Vevelyn Francois, NP   Brief Narrative:  This 19 years old morbidly obese female with PMH of rhabdomyolysis, PID, recent admission at [facility] for rhabdomyolysis and possible PID,  initially CK levels were 31,000,  LFTs were elevated. GI was consulted,  felt that the patient's LFT are elevated because of rhabdomyolysis.  CT abdomen concerning for pelvic inflammatory disease.  Patient was empirically started on ceftriaxone, doxycycline and metronidazole.  Patient was also started on steroids for rhabdomyolysis.  CK level trending down.  Patient continued to have pain and has developed swelling of the right buttock area concerning for abscess. Patient was found to have small abscess to the right buttock which was drained by ER physician and started on empiric antibiotics.  Patient has severe pain in both thighs and arms and there is concern about myositis.  General surgery consulted scheduled to have muscle biopsy on Monday.  Assessment & Plan:   Principal Problem:   Sepsis ([place]) Active Problems:   Rhabdomyolysis   Elevated liver enzymes   Obesity, Class III, BMI 40-49.9 (morbid obesity) (HCC)   Cellulitis   Morbidly obese (HCC)   Severe sepsis could be secondary to PID and right buttock Abscess. Patient met sepsis criteria on admission.  HR> 90,> 12 K, lactic acid> 2 multiple points of infection to include abscess on buttocks, PID confirmed on CT scan. Continue current antibiotics. Continue Lactated Ringer's 174m/hr reduced to 75 mL/h with daily Trend lactic acid/procalcitonin.  CT pelvis shows persistent pelvic inflammatory disease with some improvement.  Dr. PIlda Basseton-call OB/GYN was consulted , recommended transvaginal ultrasound and chlamydia and gonorrhea probe.   Chlamydia and gonorrhea is negative.  Transvaginal ultrasound unremarkable.  Sepsis physiology  Improving  Rhabdomyolysis >> Improving. Found to have elevated CK levels Continue aggressive IV hydration CK levels are trending down.  Myositis Patient signs and symptoms are consistent with myositis.  Patient only took 3 days of steroids. Patient requires muscle biopsy however would not be safe until sepsis and rhabdomyolysis resolved. No family history of autoimmune disease. 12/12 Sent myositis panel, So far negative. 12/16  General surgery consulted for muscle biopsy. 12/17  NPO midnight on Sunday, for muscle biopsy on Monday morning.  Elevated liver enzymes Most likely secondary to rhabdomyolysis. Acute hepatitis panel negative. LFTs trending down.  Sinus tachycardia: Started on metoprolol 12.5 mg twice daily. Heart rate continues to remain elevated , metoprolol increased to 25 mg twice daily.  Morbidly obese Once patient stable and discharged would benefit from weight loss program.  DVT prophylaxis: Lovenox Code Status: Full code Family Communication: Grandmother at bedside  Disposition Plan:   Status is: Inpatient  Remains inpatient appropriate because:Inpatient level of care appropriate due to severity of illness   Dispo: The patient is from: Home              Anticipated d/c is to: Home              Anticipated d/c date is: > 3 days              Patient currently is not medically stable to d/c.   Consultants:   General surgery, GYN.  Procedures: Incision and drainage of right buttock abscess Antimicrobials:   Anti-infectives (From admission, onward)   Start     Dose/Rate Route Frequency Ordered Stop   02/09/20 0600  ceFAZolin (ANCEF) 3 g in dextrose 5 % 50  mL IVPB        3 g 100 mL/hr over 30 Minutes Intravenous On call to O.R. 02/07/20 1111 02/10/20 0559   02/02/20 1600  cefTRIAXone (ROCEPHIN) 2 g in sodium chloride 0.9 % 100 mL IVPB        2 g 200 mL/hr over 30 Minutes Intravenous Every 24 hours 02/02/20 1012     02/02/20 1430  azithromycin  (ZITHROMAX) 500 mg in sodium chloride 0.9 % 250 mL IVPB        500 mg 250 mL/hr over 60 Minutes Intravenous Daily 02/02/20 1335     02/02/20 1100  metroNIDAZOLE (FLAGYL) tablet 500 mg        500 mg Oral Every 8 hours 02/02/20 1012     02/02/20 1100  doxycycline (VIBRA-TABS) tablet 100 mg  Status:  Discontinued        100 mg Oral Every 12 hours 02/02/20 1012 02/02/20 1335   01/31/20 1200  vancomycin (VANCOREADY) IVPB 1250 mg/250 mL  Status:  Discontinued        1,250 mg 166.7 mL/hr over 90 Minutes Intravenous Every 8 hours 01/31/20 0230 01/31/20 1052   01/31/20 1200  vancomycin (VANCOREADY) IVPB 1500 mg/300 mL  Status:  Discontinued        1,500 mg 150 mL/hr over 120 Minutes Intravenous 2 times daily 01/31/20 1052 02/02/20 1012   01/31/20 1000  ceFEPIme (MAXIPIME) 2 g in sodium chloride 0.9 % 100 mL IVPB  Status:  Discontinued        2 g 200 mL/hr over 30 Minutes Intravenous Every 8 hours 01/31/20 0226 02/02/20 1012   01/31/20 0330  vancomycin (VANCOREADY) IVPB 2000 mg/400 mL        2,000 mg 200 mL/hr over 120 Minutes Intravenous  Once 01/31/20 0146 01/31/20 0517   01/31/20 0300  ceFEPIme (MAXIPIME) 2 g in sodium chloride 0.9 % 100 mL IVPB        2 g 200 mL/hr over 30 Minutes Intravenous  Once 01/31/20 0146 01/31/20 0247   01/30/20 2300  ceFAZolin (ANCEF) IVPB 1 g/50 mL premix        1 g 100 mL/hr over 30 Minutes Intravenous  Once 01/30/20 2258 01/30/20 2332      Subjective: Patient was seen and examined at bedside.  No overnight events.   She continues to have pain in the thighs and arms, Her abscess site is improving, denies fever, chills. She reports pain is controlled with pain medications.  Her arms are swollen.  Objective: Vitals:   02/07/20 0549 02/07/20 0800 02/07/20 0939 02/07/20 1349  BP: (!) 135/50  (!) 127/52 (!) 156/69  Pulse: (!) 113 (!) 105 (!) 107 (!) 108  Resp: _0 Temp: 98.6 F (37 C)  98.3 F (36.8 C) 98 F (36.7 C)  TempSrc: Oral  Oral Oral  SpO2:  100%  100% 100%  Weight:      Height:        Intake/Output Summary (Last 24 hours) at 02/07/2020 1402 Last data filed at 02/07/2020 1250 Gross per 24 hour  Intake 7230.13 ml  Output 1100 ml  Net 6130.13 ml   Filed Weights   01/30/20 2144  Weight: 134.7 kg    Examination:  General exam: Appears calm and comfortable , not in any distress. Respiratory system: Clear to auscultation. Respiratory effort normal. Cardiovascular system: S1 & S2 heard, RRR. No JVD, murmurs, rubs, gallops or clicks. No pedal edema. Gastrointestinal system: Abdomen is nondistended, soft  and nontender. No organomegaly or masses felt.  Normal bowel sounds heard. Central nervous system: Alert and oriented x 3 . No focal neurological deficits. Extremities: Bilateral lower extremity/ arm  swelling. Skin:  Abscess site , dry, no erythema , No rashes, lesions or ulcers Psychiatry: Judgement and insight appear normal. Mood & affect appropriate.     Data Reviewed: I have personally reviewed following labs and imaging studies  CBC: Recent Labs  Lab 02/02/20 1644 02/03/20 0436 02/04/20 0310 02/05/20 0411 02/06/20 0449 02/07/20 0517  WBC 15.9* 15.7* 13.9* 13.0* 13.4* 13.0*  NEUTROABS 12.0* 11.6* 10.1* 8.8* 9.9*  --   HGB 12.2 11.2* 10.8* 10.8* 10.5* 10.3*  HCT 37.8 34.6* 33.2* 33.4* 33.1* 32.1*  MCV 88.5 87.8 88.5 89.3 89.7 89.7  PLT 353 306 307 319 326 409   Basic Metabolic Panel: Recent Labs  Lab 02/03/20 0436 02/04/20 0310 02/05/20 0411 02/06/20 0449 02/07/20 0517  NA 135 137 139 137 140  K 3.8 3.6 3.7 3.7 3.6  CL 103 105 105 103 104  CO2 _0 GLUCOSE 93 103* 98 105* 95  BUN _1 CREATININE 0.45 0.42* 0.45 0.48 0.47  CALCIUM 8.2* 8.0* 7.9* 8.0* 8.2*  MG 1.8 1.7 1.6* 1.7 1.7  PHOS 3.8 3.4 4.0 3.7 3.9   GFR: Estimated Creatinine Clearance: 156 mL/min (by C-G formula based on SCr of 0.47 mg/dL). Liver Function Tests: Recent Labs  Lab 02/02/20 1644 02/03/20 0436  02/04/20 0310 02/05/20 0411 02/06/20 0449  AST 162* 152* 151* 142* 143*  ALT 53* 48* 45* 44 47*  ALKPHOS 36* 31* 31* 34* 32*  BILITOT 0.7 0.4 0.4 0.3 0.2*  PROT 5.4* 5.0* 4.9* 4.8* 4.9*  ALBUMIN 2.4* 2.3* 2.1* 2.1* 2.2*   No results for input(s): LIPASE, AMYLASE in the last 168 hours. No results for input(s): AMMONIA in the last 168 hours. Coagulation Profile: No results for input(s): INR, PROTIME in the last 168 hours. Cardiac Enzymes: Recent Labs  Lab 02/03/20 0436 02/04/20 0310 02/05/20 0411 02/06/20 0449 02/07/20 0517  CKTOTAL 4,949* 5,501* 5,629* 4,879* 2,155*   BNP (last 3 results) No results for input(s): PROBNP in the last 8760 hours. HbA1C: No results for input(s): HGBA1C in the last 72 hours. CBG: No results for input(s): GLUCAP in the last 168 hours. Lipid Profile: No results for input(s): CHOL, HDL, LDLCALC, TRIG, CHOLHDL, LDLDIRECT in the last 72 hours. Thyroid Function Tests: No results for input(s): TSH, T4TOTAL, FREET4, T3FREE, THYROIDAB in the last 72 hours. Anemia Panel: No results for input(s): VITAMINB12, FOLATE, FERRITIN, TIBC, IRON, RETICCTPCT in the last 72 hours. Sepsis Labs: Recent Labs  Lab 01/31/20 2136 02/01/20 0356  PROCALCITON <0.10 <0.10    Recent Results (from the past 240 hour(s))  Urine culture     Status: Abnormal   Collection Time: 01/30/20  8:12 PM   Specimen: In/Out Cath Urine  Result Value Ref Range Status   Specimen Description   Final    IN/OUT CATH URINE Performed at [facility], [place] [address], [place], [place] [address]    Special Requests   Final    NONE Performed at [facility], [place] [address], [place], [place] [address]    Culture MULTIPLE SPECIES PRESENT, SUGGEST RECOLLECTION (A)  Final   Report Status 02/01/2020 FINAL  Final  Wound or Superficial Culture     Status: None   Collection Time: 01/30/20  9:33 PM   Specimen: Abscess;  Wound  Result Value Ref Range Status    Specimen Description   Final    ABSCESS Performed at [place] [address], [place], [place] [address]    Special Requests   Final    NONE Performed at [facility], [place] [address], [place], [place] [address]    Gram Stain NO WBC SEEN NO ORGANISMS SEEN   Final   Culture   Final    NORMAL SKIN FLORA Performed at [facility], [place] [address] [place], [place] [address]    Report Status 02/02/2020 FINAL  Final  Resp Panel by RT-PCR (Flu A&B, Covid) Nasopharyngeal Swab     Status: None   Collection Time: 01/30/20 11:08 PM   Specimen: Nasopharyngeal Swab; Nasopharyngeal(NP) swabs in vial transport medium  Result Value Ref Range Status   SARS Coronavirus 2 by RT PCR NEGATIVE NEGATIVE Final    Comment: (NOTE) SARS-CoV-2 target nucleic acids are NOT DETECTED.  The SARS-CoV-2 RNA is generally detectable in upper respiratory specimens during the acute phase of infection. The lowest concentration of SARS-CoV-2 viral copies this assay can detect is 138 copies/mL. A negative result does not preclude SARS-Cov-2 infection and should not be used as the sole basis for treatment or other patient management decisions. A negative result may occur with  improper specimen collection/handling, submission of specimen other than nasopharyngeal swab, presence of viral mutation(s) within the areas targeted by this assay, and inadequate number of viral copies(<138 copies/mL). A negative result must be combined with clinical observations, patient history, and epidemiological information. The expected result is Negative.  Fact Sheet for Patients:  EntrepreneurPulse.com.au  Fact Sheet for Healthcare Providers:  IncredibleEmployment.be  This test is no t yet approved or cleared by the [place] FDA and  has been authorized for detection and/or diagnosis of SARS-CoV-2 by FDA under an Emergency Use Authorization  (EUA). This EUA will remain  in effect (meaning this test can be used) for the duration of the COVID-19 declaration under Section 564(b)(1) of the Act, 21 U.S.C.section 360bbb-3(b)(1), unless the authorization is terminated  or revoked sooner.       Influenza A by PCR NEGATIVE NEGATIVE Final   Influenza B by PCR NEGATIVE NEGATIVE Final    Comment: (NOTE) The Xpert Xpress SARS-CoV-2/FLU/RSV plus assay is intended as an aid in the diagnosis of influenza from Nasopharyngeal swab specimens and should not be used as a sole basis for treatment. Nasal washings and aspirates are unacceptable for Xpert Xpress SARS-CoV-2/FLU/RSV testing.  Fact Sheet for Patients: EntrepreneurPulse.com.au  Fact Sheet for Healthcare Providers: IncredibleEmployment.be  This test is not yet approved or cleared by the [place] FDA and has been authorized for detection and/or diagnosis of SARS-CoV-2 by FDA under an Emergency Use Authorization (EUA). This EUA will remain in effect (meaning this test can be used) for the duration of the COVID-19 declaration under Section 564(b)(1) of the Act, 21 U.S.C. section 360bbb-3(b)(1), unless the authorization is terminated or revoked.  Performed at [facility] [place] [address] [place], [place] [address]   Culture, blood (routine x 2)     Status: None   Collection Time: 01/31/20  3:29 AM   Specimen: BLOOD  Result Value Ref Range Status   Specimen Description   Final    BLOOD RIGHT ANTECUBITAL Performed at [place] [address] [place], [place] [address]    Special Requests   Final    BOTTLES DRAWN AEROBIC  ONLY Blood Culture adequate volume Performed at [place] [address], [place], [place] [address]    Culture   Final    NO GROWTH 6 DAYS Performed at [facility], [place] [address] [place], [place] [address]    Report Status 02/06/2020 FINAL   Final  Culture, blood (routine x 2)     Status: None   Collection Time: 01/31/20  3:29 AM   Specimen: BLOOD  Result Value Ref Range Status   Specimen Description   Final    BLOOD BLOOD RIGHT FOREARM Performed at [place] [address] [place], [place] [address]    Special Requests   Final    BOTTLES DRAWN AEROBIC ONLY Blood Culture adequate volume Performed at [place] [address], [place], [place] [address]    Culture   Final    NO GROWTH 6 DAYS Performed at [facility], [place] [address] [place], [place] [address]    Report Status 02/06/2020 FINAL  Final     Radiology Studies: No results found.  Scheduled Meds:  [START ON 02/09/2020] acetaminophen  1,000 mg Oral On Call to OR   Chlorhexidine Gluconate Cloth  6 each Topical Daily   Chlorhexidine Gluconate Cloth  6 each Topical Once   metoprolol tartrate  12.5 mg Oral BID   metroNIDAZOLE  500 mg Oral Q8H   oxyCODONE  10 mg Oral Q12H   polyethylene glycol  17 g Oral BID   senna  1 tablet Oral QHS   Continuous Infusions:  azithromycin Stopped (02/07/20 0922)   [START ON 02/09/2020]  ceFAZolin (ANCEF) IV     cefTRIAXone (ROCEPHIN)  IV 2 g (02/06/20 1513)   lactated ringers 75 mL/hr at 02/07/20 1341     LOS: 7 days    Time spent: 25 mins    Takari Lundahl, MD [facility]   If 7PM-7AM, please contact night-coverage

## 2020-02-08 DIAGNOSIS — A419 Sepsis, unspecified organism: Secondary | ICD-10-CM | POA: Diagnosis not present

## 2020-02-08 DIAGNOSIS — L0291 Cutaneous abscess, unspecified: Secondary | ICD-10-CM | POA: Diagnosis not present

## 2020-02-08 DIAGNOSIS — R6520 Severe sepsis without septic shock: Secondary | ICD-10-CM | POA: Diagnosis not present

## 2020-02-08 DIAGNOSIS — M6282 Rhabdomyolysis: Secondary | ICD-10-CM | POA: Diagnosis not present

## 2020-02-08 LAB — CBC
HCT: 31.1 % — ABNORMAL LOW (ref 36.0–46.0)
Hemoglobin: 10 g/dL — ABNORMAL LOW (ref 12.0–15.0)
MCH: 29 pg (ref 26.0–34.0)
MCHC: 32.2 g/dL (ref 30.0–36.0)
MCV: 90.1 fL (ref 80.0–100.0)
Platelets: 353 10*3/uL (ref 150–400)
RBC: 3.45 MIL/uL — ABNORMAL LOW (ref 3.87–5.11)
RDW: 16.1 % — ABNORMAL HIGH (ref 11.5–15.5)
WBC: 10.9 10*3/uL — ABNORMAL HIGH (ref 4.0–10.5)
nRBC: 0 % (ref 0.0–0.2)

## 2020-02-08 LAB — BASIC METABOLIC PANEL
Anion gap: 9 (ref 5–15)
BUN: 6 mg/dL (ref 6–20)
CO2: 26 mmol/L (ref 22–32)
Calcium: 7.9 mg/dL — ABNORMAL LOW (ref 8.9–10.3)
Chloride: 104 mmol/L (ref 98–111)
Creatinine, Ser: 0.42 mg/dL — ABNORMAL LOW (ref 0.44–1.00)
GFR, Estimated: >60 mL/min (ref >60)
Glucose, Bld: 94 mg/dL (ref 70–99)
Potassium: 3.5 mmol/L (ref 3.5–5.1)
Sodium: 139 mmol/L (ref 135–145)

## 2020-02-08 LAB — PHOSPHORUS: Phosphorus: 4.3 mg/dL (ref 2.5–4.6)

## 2020-02-08 LAB — MRSA PCR SCREENING: MRSA by PCR: NEGATIVE

## 2020-02-08 LAB — CK: Total CK: 4442 U/L — ABNORMAL HIGH (ref 38–234)

## 2020-02-08 LAB — MAGNESIUM: Magnesium: 1.6 mg/dL — ABNORMAL LOW (ref 1.7–2.4)

## 2020-02-08 MED ORDER — ENOXAPARIN SODIUM 40 MG/0.4ML ~~LOC~~ SOLN
40.0000 mg | SUBCUTANEOUS | Status: DC
  Administered 2020-02-09 – 2020-02-15 (×7): 40 mg via SUBCUTANEOUS
  Filled 2020-02-08 (×7): qty 0.4

## 2020-02-08 MED ORDER — MAGNESIUM SULFATE 2 GM/50ML IV SOLN
2.0000 g | Freq: Once | INTRAVENOUS | Status: AC
  Administered 2020-02-08: 17:00:00 2 g via INTRAVENOUS
  Filled 2020-02-08: qty 50

## 2020-02-08 NOTE — Plan of Care (Signed)
  Problem: Clinical Measurements: Goal: Respiratory complications will improve Outcome: Progressing   Problem: Clinical Measurements: Goal: Cardiovascular complication will be avoided Outcome: Progressing   Problem: Coping: Goal: Level of anxiety will decrease Outcome: Progressing   Problem: Elimination: Goal: Will not experience complications related to bowel motility Outcome: Progressing   Problem: Elimination: Goal: Will not experience complications related to urinary retention Outcome: Progressing   Problem: Safety: Goal: Ability to remain free from injury will improve Outcome: Progressing   Problem: Skin Integrity: Goal: Risk for impaired skin integrity will decrease Outcome: Progressing

## 2020-02-08 NOTE — Progress Notes (Signed)
Pt stated she was unsure if she wanted muscle biopsy performed on Monday due to he current lab levels and current infection. Rn encouraged pt to speak with mds about these concerns.

## 2020-02-08 NOTE — Progress Notes (Addendum)
[facility]  PROGRESS NOTE  Haley Sandoval PNT:[id number] DOB: 2000-11-25 DOA: 01/30/2020 PCP: Vevelyn Francois, NP   Brief HPI:   19 year old female with past medical history of rhabdomyolysis, pelvic inflammatory disease.  She had recent patient at [facility] for abdomen is then possible PID.  Initial CK was elevated at 31,000.  CT was concerning for PID.  She was empirically started on ceftriaxone, doxycycline and metronidazole.  Patient was also started on steroids for rhabdomyolysis.  She was found to have small abscess on right buttock which was drained by the ER physician and started on empiric antibiotics.  Also complained of pain in both thighs and arms, concern for myositis.  General surgery was consulted for possible muscle biopsy on Monday.    Subjective   Patient seen and examined, CKs trending down.  Muscle biopsy planned for Monday.  Patient is not sure about getting muscle biopsy.  She feels that rhabdomyolysis is likely from the abscess, she had abscess since March of this year which slowly got worse.     Assessment/Plan:      Severe sepsis-likely from PID versus right buttock abscess she met sepsis criteria on admission.  Sepsis has currently resolved.  Patient is currently on ceftriaxone and Zithromax.  Will discontinue Zithromax.  Continue with ceftriaxone.Dr. Jarvis Morgan OB/GYN was consulted , recommended transvaginal ultrasound and chlamydia and gonorrhea probe.   Chlamydia and gonorrhea is negative.  Transvaginal ultrasound unremarkable.  Rhabdomyolysis-CK is down to 4442.  It was significantly elevated to 31,230.  Continue IV LR at 125 mill per hour.  Follow CK in a.m.  ?  Myositis-myositis panel is negative so far, general surgery was consulted for possible muscle biopsy for myositis.  Patient is not sure about getting biopsy.  Will discuss with patient in a.m.  Transaminitis-likely from rhabdomyolysis.  Acute hepatitis panel is negative.  LFTs  trending down.  Hypertension-started on metoprolol 25 mg twice a day  Morbid obesity-patient has morbid obesity, will benefit from weight loss program as outpatient.     COVID-19 Labs  No results for input(s): DDIMER, FERRITIN, LDH, CRP in the last 72 hours.  Lab Results  Component Value Date   SARSCOV2NAA NEGATIVE 01/30/2020   [place] NEGATIVE 01/18/2020     Scheduled medications:    [START ON 02/09/2020] acetaminophen  1,000 mg Oral On Call to OR   Chlorhexidine Gluconate Cloth  6 each Topical Daily   [START ON 02/09/2020] Chlorhexidine Gluconate Cloth  6 each Topical Once   metoprolol tartrate  25 mg Oral BID   metroNIDAZOLE  500 mg Oral Q8H   oxyCODONE  10 mg Oral Q12H   polyethylene glycol  17 g Oral BID   senna  1 tablet Oral QHS         CBG: No results for input(s): GLUCAP in the last 168 hours.  SpO2: 100 %    CBC: Recent Labs  Lab 02/02/20 1644 02/03/20 0436 02/04/20 0310 02/05/20 0411 02/06/20 0449 02/07/20 0517 02/08/20 0343  WBC 15.9* 15.7* 13.9* 13.0* 13.4* 13.0* 10.9*  NEUTROABS 12.0* 11.6* 10.1* 8.8* 9.9*  --   --   HGB 12.2 11.2* 10.8* 10.8* 10.5* 10.3* 10.0*  HCT 37.8 34.6* 33.2* 33.4* 33.1* 32.1* 31.1*  MCV 88.5 87.8 88.5 89.3 89.7 89.7 90.1  PLT 353 306 307 319 326 324 086    Basic Metabolic Panel: Recent Labs  Lab 02/04/20 0310 02/05/20 0411 02/06/20 0449 02/07/20 0517 02/08/20 0343  NA 137 139 137 140 139  K 3.6 3.7 3.7 3.6 3.5  CL 105 105 103 104 104  CO2 _0 GLUCOSE 103* 98 105* 95 94  BUN _1 CREATININE 0.42* 0.45 0.48 0.47 0.42*  CALCIUM 8.0* 7.9* 8.0* 8.2* 7.9*  MG 1.7 1.6* 1.7 1.7 1.6*  PHOS 3.4 4.0 3.7 3.9 4.3     Liver Function Tests: Recent Labs  Lab 02/02/20 1644 02/03/20 0436 02/04/20 0310 02/05/20 0411 02/06/20 0449  AST 162* 152* 151* 142* 143*  ALT 53* 48* 45* 44 47*  ALKPHOS 36* 31* 31* 34* 32*  BILITOT 0.7 0.4 0.4 0.3 0.2*  PROT 5.4* 5.0* 4.9* 4.8* 4.9*   ALBUMIN 2.4* 2.3* 2.1* 2.1* 2.2*     Antibiotics: Anti-infectives (From admission, onward)   Start     Dose/Rate Route Frequency Ordered Stop   02/09/20 0600  ceFAZolin (ANCEF) 3 g in dextrose 5 % 50 mL IVPB        3 g 100 mL/hr over 30 Minutes Intravenous On call to O.R. 02/07/20 1111 02/10/20 0559   02/02/20 1600  cefTRIAXone (ROCEPHIN) 2 g in sodium chloride 0.9 % 100 mL IVPB        2 g 200 mL/hr over 30 Minutes Intravenous Every 24 hours 02/02/20 1012     02/02/20 1430  azithromycin (ZITHROMAX) 500 mg in sodium chloride 0.9 % 250 mL IVPB        500 mg 250 mL/hr over 60 Minutes Intravenous Daily 02/02/20 1335     02/02/20 1100  metroNIDAZOLE (FLAGYL) tablet 500 mg        500 mg Oral Every 8 hours 02/02/20 1012     02/02/20 1100  doxycycline (VIBRA-TABS) tablet 100 mg  Status:  Discontinued        100 mg Oral Every 12 hours 02/02/20 1012 02/02/20 1335   01/31/20 1200  vancomycin (VANCOREADY) IVPB 1250 mg/250 mL  Status:  Discontinued        1,250 mg 166.7 mL/hr over 90 Minutes Intravenous Every 8 hours 01/31/20 0230 01/31/20 1052   01/31/20 1200  vancomycin (VANCOREADY) IVPB 1500 mg/300 mL  Status:  Discontinued        1,500 mg 150 mL/hr over 120 Minutes Intravenous 2 times daily 01/31/20 1052 02/02/20 1012   01/31/20 1000  ceFEPIme (MAXIPIME) 2 g in sodium chloride 0.9 % 100 mL IVPB  Status:  Discontinued        2 g 200 mL/hr over 30 Minutes Intravenous Every 8 hours 01/31/20 0226 02/02/20 1012   01/31/20 0330  vancomycin (VANCOREADY) IVPB 2000 mg/400 mL        2,000 mg 200 mL/hr over 120 Minutes Intravenous  Once 01/31/20 0146 01/31/20 0517   01/31/20 0300  ceFEPIme (MAXIPIME) 2 g in sodium chloride 0.9 % 100 mL IVPB        2 g 200 mL/hr over 30 Minutes Intravenous  Once 01/31/20 0146 01/31/20 0247   01/30/20 2300  ceFAZolin (ANCEF) IVPB 1 g/50 mL premix        1 g 100 mL/hr over 30 Minutes Intravenous  Once 01/30/20 2258 01/30/20 2332       DVT prophylaxis:  Lovenox  Code Status: Full code  Family Communication: No family at bedside   Consultants:    Procedures:      Objective   Vitals:   02/08/20 0434 02/08/20 0653 02/08/20 0956 02/08/20 1335  BP: (!) 132/52  (!) 148/77 (!) 142/56  Pulse: Marland Kitchen)  114 (!) 105 (!) 101 (!) 108  Resp: _0 Temp: 98.8 F (37.1 C)  98.5 F (36.9 C) 98.7 F (37.1 C)  TempSrc: Oral   Oral  SpO2: 100%  100% 100%  Weight:      Height:        Intake/Output Summary (Last 24 hours) at 02/08/2020 1626 Last data filed at 02/08/2020 1526 Gross per 24 hour  Intake 3758.08 ml  Output 2600 ml  Net 1158.08 ml    12/17 1901 - 12/19 0700 In: 6670.8 [P.O.:1950; I.V.:4270.8] Out: 2400 [Urine:2400]  Filed Weights   01/30/20 2144  Weight: 134.7 kg    Physical Examination:    General-appears in no acute distress  Heart-S1-S2, regular, no murmur auscultated  Lungs-clear to auscultation bilaterally, no wheezing or crackles auscultated  Abdomen-soft, nontender, no organomegaly  Extremities-no edema in the lower extremities  Neuro-alert, oriented x3, no focal deficit noted   Status is: Inpatient  Dispo: The patient is from: Home              Anticipated d/c is to: Home              Anticipated d/c date is: 02/10/2020              Patient currently not stable for discharge  Barrier to discharge-ongoing treatment for rhabdomyolysis      Data Reviewed:   Recent Results (from the past 240 hour(s))  Urine culture     Status: Abnormal   Collection Time: 01/30/20  8:12 PM   Specimen: In/Out Cath Urine  Result Value Ref Range Status   Specimen Description   Final    IN/OUT CATH URINE Performed at [facility] [address] [place], [place] [address]    Special Requests   Final    NONE Performed at [facility], [place] [address], [place], [place] [address]    Culture MULTIPLE SPECIES PRESENT, SUGGEST RECOLLECTION (A)  Final   Report Status  02/01/2020 FINAL  Final  Wound or Superficial Culture     Status: None   Collection Time: 01/30/20  9:33 PM   Specimen: Abscess; Wound  Result Value Ref Range Status   Specimen Description   Final    ABSCESS Performed at [facility] [address], [place], [place] [address]    Special Requests   Final    NONE Performed at [facility], [place] [address] [place], [place] [address]    Gram Stain NO WBC SEEN NO ORGANISMS SEEN   Final   Culture   Final    NORMAL SKIN FLORA Performed at [facility], [place] [address], [place], [place] [address]    Report Status 02/02/2020 FINAL  Final  Resp Panel by RT-PCR (Flu A&B, Covid) Nasopharyngeal Swab     Status: None   Collection Time: 01/30/20 11:08 PM   Specimen: Nasopharyngeal Swab; Nasopharyngeal(NP) swabs in vial transport medium  Result Value Ref Range Status   SARS Coronavirus 2 by RT PCR NEGATIVE NEGATIVE Final    Comment: (NOTE) SARS-CoV-2 target nucleic acids are NOT DETECTED.  The SARS-CoV-2 RNA is generally detectable in upper respiratory specimens during the acute phase of infection. The lowest concentration of SARS-CoV-2 viral copies this assay can detect is 138 copies/mL. A negative result does not preclude SARS-Cov-2 infection and should not be used as the sole basis for treatment or other patient management decisions. A negative result may  occur with  improper specimen collection/handling, submission of specimen other than nasopharyngeal swab, presence of viral mutation(s) within the areas targeted by this assay, and inadequate number of viral copies(<138 copies/mL). A negative result must be combined with clinical observations, patient history, and epidemiological information. The expected result is Negative.  Fact Sheet for Patients:  EntrepreneurPulse.com.au  Fact Sheet for Healthcare Providers:  IncredibleEmployment.be  This test is  no t yet approved or cleared by the [place] FDA and  has been authorized for detection and/or diagnosis of SARS-CoV-2 by FDA under an Emergency Use Authorization (EUA). This EUA will remain  in effect (meaning this test can be used) for the duration of the COVID-19 declaration under Section 564(b)(1) of the Act, 21 U.S.C.section 360bbb-3(b)(1), unless the authorization is terminated  or revoked sooner.       Influenza A by PCR NEGATIVE NEGATIVE Final   Influenza B by PCR NEGATIVE NEGATIVE Final    Comment: (NOTE) The Xpert Xpress SARS-CoV-2/FLU/RSV plus assay is intended as an aid in the diagnosis of influenza from Nasopharyngeal swab specimens and should not be used as a sole basis for treatment. Nasal washings and aspirates are unacceptable for Xpert Xpress SARS-CoV-2/FLU/RSV testing.  Fact Sheet for Patients: EntrepreneurPulse.com.au  Fact Sheet for Healthcare Providers: IncredibleEmployment.be  This test is not yet approved or cleared by the [place] FDA and has been authorized for detection and/or diagnosis of SARS-CoV-2 by FDA under an Emergency Use Authorization (EUA). This EUA will remain in effect (meaning this test can be used) for the duration of the COVID-19 declaration under Section 564(b)(1) of the Act, 21 U.S.C. section 360bbb-3(b)(1), unless the authorization is terminated or revoked.  Performed at [facility], [place] [address] [place], [place] [address]   Culture, blood (routine x 2)     Status: None   Collection Time: 01/31/20  3:29 AM   Specimen: BLOOD  Result Value Ref Range Status   Specimen Description   Final    BLOOD RIGHT ANTECUBITAL Performed at [facility] [address], [place], [place] [address]    Special Requests   Final    BOTTLES DRAWN AEROBIC ONLY Blood Culture adequate volume Performed at [facility] [address],  [place], [place] [address]    Culture   Final    NO GROWTH 6 DAYS Performed at [facility], [place] [address], [place], [place] [address]    Report Status 02/06/2020 FINAL  Final  Culture, blood (routine x 2)     Status: None   Collection Time: 01/31/20  3:29 AM   Specimen: BLOOD  Result Value Ref Range Status   Specimen Description   Final    BLOOD BLOOD RIGHT FOREARM Performed at [facility] [address], [place], [place] [address]    Special Requests   Final    BOTTLES DRAWN AEROBIC ONLY Blood Culture adequate volume Performed at [facility] [address], [place], [place] [address]    Culture   Final    NO GROWTH 6 DAYS Performed at [facility], [place] [address], [place], [place] [address]    Report Status 02/06/2020 FINAL  Final    No results for input(s): LIPASE, AMYLASE in the last 168 hours. No results for input(s): AMMONIA in the last 168 hours.  Cardiac Enzymes: Recent Labs  Lab 02/04/20 0310 02/05/20 0411 02/06/20 0449 02/07/20 0517 02/08/20 [place]* 5,629* 4,879* 2,155*  4,442*   BNP (last 3 results) Recent Labs    01/30/20 1538  BNP CANCELED    ProBNP (last 3 results) No results for input(s): PROBNP in the last 8760 hours.  Studies:  No results found.     Oswald Hillock   [facility] If 7PM-7AM, please contact night-coverage at [email or web address], Office  [phone]   02/08/2020, 4:26 PM  LOS: 8 days

## 2020-02-09 ENCOUNTER — Encounter (HOSPITAL_COMMUNITY): Payer: Self-pay | Admitting: Anesthesiology

## 2020-02-09 ENCOUNTER — Encounter (HOSPITAL_COMMUNITY)
Admission: EM | Disposition: A | Discharge status: Home Health | Payer: Self-pay | Source: Home / Self Care | Attending: Internal Medicine

## 2020-02-09 DIAGNOSIS — L0291 Cutaneous abscess, unspecified: Secondary | ICD-10-CM | POA: Diagnosis not present

## 2020-02-09 DIAGNOSIS — A419 Sepsis, unspecified organism: Secondary | ICD-10-CM | POA: Diagnosis not present

## 2020-02-09 DIAGNOSIS — L0231 Cutaneous abscess of buttock: Secondary | ICD-10-CM | POA: Diagnosis not present

## 2020-02-09 DIAGNOSIS — M609 Myositis, unspecified: Secondary | ICD-10-CM | POA: Diagnosis not present

## 2020-02-09 DIAGNOSIS — R6520 Severe sepsis without septic shock: Secondary | ICD-10-CM | POA: Diagnosis not present

## 2020-02-09 DIAGNOSIS — M6282 Rhabdomyolysis: Secondary | ICD-10-CM | POA: Diagnosis not present

## 2020-02-09 LAB — ALDOLASE: Aldolase: 26.9 U/L — ABNORMAL HIGH (ref 3.3–10.3)

## 2020-02-09 LAB — PHOSPHORUS: Phosphorus: 4.6 mg/dL (ref 2.5–4.6)

## 2020-02-09 LAB — CK: Total CK: 3396 U/L — ABNORMAL HIGH (ref 38–234)

## 2020-02-09 LAB — MAGNESIUM: Magnesium: 1.9 mg/dL (ref 1.7–2.4)

## 2020-02-09 SURGERY — MUSCLE BIOPSY
Anesthesia: Choice

## 2020-02-09 NOTE — Plan of Care (Signed)
Plan of care reviewed and discussed with the patient. 

## 2020-02-09 NOTE — Progress Notes (Signed)
     Assessment & Plan: Elevated CK level - rule out myositis  CK markedly improved - down from 31,000  Checked with pathology lab - have kit and can process specimen any day this week  Discussed with Dr. Darrick Meigs - he will review with rheumatology before final decision to proceed  I discussed procedure with patient and her mother at the bedside, nursing in room.  Described procedure and rationale for location of biopsy.  They understand.  Will await decision from hospitalist and rhematologist before proceeding to surgery.        Armandina Gemma, MD       [facility]       Office: [phone]   Chief Complaint: Muscle pain, rhabdomyolysis  Subjective: Patient in bed, appears comfortable.  Family and nursing at bedside.  States she has pain all over.  Objective: Vital signs in last 24 hours: Temp:  [98.3 F (36.8 C)-98.8 F (37.1 C)] 98.7 F (37.1 C) (12/20 0906) Pulse Rate:  [102-116] 102 (12/20 0906) Resp:  [15-20] 20 (12/20 0906) BP: (119-146)/(41-82) 135/64 (12/20 0906) SpO2:  [99 %-100 %] 100 % (12/20 0906) Last BM Date: 02/08/20  Intake/Output from previous day: 12/19 0701 - 12/20 0700 In: 3390.6 [P.O.:840; I.V.:2192.9; IV Piggyback:357.7] Out: 2000 [Urine:2000] Intake/Output this shift: Total I/O In: 0  Out: 1350 [Urine:1350]  Physical Exam: HEENT - sclerae clear, mucous membranes moist Ext - no edema, non-tender Neuro - alert & oriented, no focal deficits  Lab Results:  Recent Labs    02/07/20 0517 02/08/20 0343  WBC 13.0* 10.9*  HGB 10.3* 10.0*  HCT 32.1* 31.1*  PLT 324 353   BMET Recent Labs    02/07/20 0517 02/08/20 0343  NA 140 139  K 3.6 3.5  CL 104 104  CO2 26 26  GLUCOSE 95 94  BUN 7 6  CREATININE 0.47 0.42*  CALCIUM 8.2* 7.9*   PT/INR No results for input(s): LABPROT, INR in the last 72 hours. Comprehensive Metabolic Panel:    Component Value Date/Time   NA 139 02/08/2020 0343   NA 140 02/07/2020 0517   NA 135  01/30/2020 1538   K 3.5 02/08/2020 0343   K 3.6 02/07/2020 0517   CL 104 02/08/2020 0343   CL 104 02/07/2020 0517   CO2 26 02/08/2020 0343   CO2 26 02/07/2020 0517   BUN 6 02/08/2020 0343   BUN 7 02/07/2020 0517   BUN 11 01/30/2020 1538   CREATININE 0.42 (L) 02/08/2020 0343   CREATININE 0.47 02/07/2020 0517   GLUCOSE 94 02/08/2020 0343   GLUCOSE 95 02/07/2020 0517   CALCIUM 7.9 (L) 02/08/2020 0343   CALCIUM 8.2 (L) 02/07/2020 0517   AST 143 (H) 02/06/2020 0449   AST 142 (H) 02/05/2020 0411   ALT 47 (H) 02/06/2020 0449   ALT 44 02/05/2020 0411   ALKPHOS 32 (L) 02/06/2020 0449   ALKPHOS 34 (L) 02/05/2020 0411   BILITOT 0.2 (L) 02/06/2020 0449   BILITOT 0.3 02/05/2020 0411   BILITOT 0.3 01/30/2020 1538   PROT 4.9 (L) 02/06/2020 0449   PROT 4.8 (L) 02/05/2020 0411   PROT 6.0 01/30/2020 1538   ALBUMIN 2.2 (L) 02/06/2020 0449   ALBUMIN 2.1 (L) 02/05/2020 0411   ALBUMIN 3.3 (L) 01/30/2020 1538    Studies/Results: No results found.    Armandina Gemma 02/09/2020  Patient ID: Carmin Muskrat, female   DOB: [birth year], 19 y.o.   MRN: [id number]

## 2020-02-09 NOTE — Progress Notes (Signed)
[facility]  PROGRESS NOTE  Haley Sandoval ZYS:[id number] DOB: Mar 23, 2000 DOA: 01/30/2020 PCP: Vevelyn Francois, NP   Brief HPI:   19 year old female with past medical history of rhabdomyolysis, pelvic inflammatory disease.  She had recent patient at [facility] hospital for abdomen is then possible PID.  Initial CK was elevated at 31,000.  CT was concerning for PID.  She was empirically started on ceftriaxone, doxycycline and metronidazole.  Patient was also started on steroids for rhabdomyolysis.  She was found to have small abscess on right buttock which was drained by the ER physician and started on empiric antibiotics.  Also complained of pain in both thighs and arms, concern for myositis.  General surgery was consulted for possible muscle biopsy on Monday.    Subjective   Patient seen and examined, biopsy was scheduled for this morning however it is currently on hold as per rheumatology recommendation.   Assessment/Plan:      Severe sepsis-likely from PID versus right buttock abscess she met sepsis criteria on admission.  Sepsis has currently resolved.  Patient is currently on ceftriaxone and Zithromax.  Will discontinue Zithromax.  Continue with ceftriaxone.Dr. Jarvis Morgan OB/GYN was consulted , recommended transvaginal ultrasound and chlamydia and gonorrhea probe.   Chlamydia and gonorrhea is negative.  Transvaginal ultrasound unremarkable.  Rhabdomyolysis-CK is down to 3396.  It was significantly elevated to 31,230.  Continue IV LR at 125 mill per hour.  Follow CK in a.m.  ?  Myositis-myositis panel is negative so far, general surgery was consulted for possible muscle biopsy for myositis.  Called and discussed with rheumatologist, Dr. Benjamine Mola at [facility] rheumatology.  He recommends to hold off on biopsy at this time as her CK has significantly improved without being on prednisone.He will see patient as outpatient.   Transaminitis-likely from rhabdomyolysis.  Acute hepatitis  panel is negative.  LFTs trending down.  Hypertension-started on metoprolol 25 mg twice a day  Hypomagnesemia-replete  Morbid obesity-patient has morbid obesity, will benefit from weight loss program as outpatient.     COVID-19 Labs  No results for input(s): DDIMER, FERRITIN, LDH, CRP in the last 72 hours.  Lab Results  Component Value Date   SARSCOV2NAA NEGATIVE 01/30/2020   [place] NEGATIVE 01/18/2020     Scheduled medications:    acetaminophen  1,000 mg Oral On Call to OR   Chlorhexidine Gluconate Cloth  6 each Topical Daily   enoxaparin (LOVENOX) injection  40 mg Subcutaneous Q24H   metoprolol tartrate  25 mg Oral BID   metroNIDAZOLE  500 mg Oral Q8H   oxyCODONE  10 mg Oral Q12H   polyethylene glycol  17 g Oral BID   senna  1 tablet Oral QHS     SpO2: 100 %    CBC: Recent Labs  Lab 02/02/20 1644 02/03/20 0436 02/04/20 0310 02/05/20 0411 02/06/20 0449 02/07/20 0517 02/08/20 0343  WBC 15.9* 15.7* 13.9* 13.0* 13.4* 13.0* 10.9*  NEUTROABS 12.0* 11.6* 10.1* 8.8* 9.9*  --   --   HGB 12.2 11.2* 10.8* 10.8* 10.5* 10.3* 10.0*  HCT 37.8 34.6* 33.2* 33.4* 33.1* 32.1* 31.1*  MCV 88.5 87.8 88.5 89.3 89.7 89.7 90.1  PLT 353 306 307 319 326 324 932    Basic Metabolic Panel: Recent Labs  Lab 02/04/20 0310 02/05/20 0411 02/06/20 0449 02/07/20 0517 02/08/20 0343 02/09/20 0410  NA 137 139 137 140 139  --   K 3.6 3.7 3.7 3.6 3.5  --   CL 105 105 103 104 104  --  CO2 _0 --   GLUCOSE 103* 98 105* 95 94  --   BUN _1 --   CREATININE 0.42* 0.45 0.48 0.47 0.42*  --   CALCIUM 8.0* 7.9* 8.0* 8.2* 7.9*  --   MG 1.7 1.6* 1.7 1.7 1.6* 1.9  PHOS 3.4 4.0 3.7 3.9 4.3 4.6     Liver Function Tests: Recent Labs  Lab 02/02/20 1644 02/03/20 0436 02/04/20 0310 02/05/20 0411 02/06/20 0449  AST 162* 152* 151* 142* 143*  ALT 53* 48* 45* 44 47*  ALKPHOS 36* 31* 31* 34* 32*  BILITOT 0.7 0.4 0.4 0.3 0.2*  PROT 5.4* 5.0* 4.9* 4.8* 4.9*   ALBUMIN 2.4* 2.3* 2.1* 2.1* 2.2*     Antibiotics: Anti-infectives (From admission, onward)   Start     Dose/Rate Route Frequency Ordered Stop   02/09/20 0600  ceFAZolin (ANCEF) 3 g in dextrose 5 % 50 mL IVPB        3 g 100 mL/hr over 30 Minutes Intravenous On call to O.R. 02/07/20 1111 02/10/20 0559   02/02/20 1600  cefTRIAXone (ROCEPHIN) 2 g in sodium chloride 0.9 % 100 mL IVPB        2 g 200 mL/hr over 30 Minutes Intravenous Every 24 hours 02/02/20 1012     02/02/20 1430  azithromycin (ZITHROMAX) 500 mg in sodium chloride 0.9 % 250 mL IVPB  Status:  Discontinued        500 mg 250 mL/hr over 60 Minutes Intravenous Daily 02/02/20 1335 02/08/20 1639   02/02/20 1100  metroNIDAZOLE (FLAGYL) tablet 500 mg        500 mg Oral Every 8 hours 02/02/20 1012     02/02/20 1100  doxycycline (VIBRA-TABS) tablet 100 mg  Status:  Discontinued        100 mg Oral Every 12 hours 02/02/20 1012 02/02/20 1335   01/31/20 1200  vancomycin (VANCOREADY) IVPB 1250 mg/250 mL  Status:  Discontinued        1,250 mg 166.7 mL/hr over 90 Minutes Intravenous Every 8 hours 01/31/20 0230 01/31/20 1052   01/31/20 1200  vancomycin (VANCOREADY) IVPB 1500 mg/300 mL  Status:  Discontinued        1,500 mg 150 mL/hr over 120 Minutes Intravenous 2 times daily 01/31/20 1052 02/02/20 1012   01/31/20 1000  ceFEPIme (MAXIPIME) 2 g in sodium chloride 0.9 % 100 mL IVPB  Status:  Discontinued        2 g 200 mL/hr over 30 Minutes Intravenous Every 8 hours 01/31/20 0226 02/02/20 1012   01/31/20 0330  vancomycin (VANCOREADY) IVPB 2000 mg/400 mL        2,000 mg 200 mL/hr over 120 Minutes Intravenous  Once 01/31/20 0146 01/31/20 0517   01/31/20 0300  ceFEPIme (MAXIPIME) 2 g in sodium chloride 0.9 % 100 mL IVPB        2 g 200 mL/hr over 30 Minutes Intravenous  Once 01/31/20 0146 01/31/20 0247   01/30/20 2300  ceFAZolin (ANCEF) IVPB 1 g/50 mL premix        1 g 100 mL/hr over 30 Minutes Intravenous  Once 01/30/20 2258 01/30/20 2332        DVT prophylaxis: Lovenox  Code Status: Full code  Family Communication: No family at bedside   Consultants:    Procedures:      Objective   Vitals:   02/08/20 2207 02/09/20 0212 02/09/20 0710 02/09/20 0906  BP: (!) 132/41 Marland Kitchen)  119/49 (!) 146/82 135/64  Pulse: (!) 112 (!) 106 (!) 116 (!) 102  Resp: _0 Temp: 98.8 F (37.1 C) 98.3 F (36.8 C) 98.6 F (37 C) 98.7 F (37.1 C)  TempSrc: Oral Oral Oral Oral  SpO2: 99% 99% 100% 100%  Weight:      Height:        Intake/Output Summary (Last 24 hours) at 02/09/2020 1320 Last data filed at 02/09/2020 1037 Gross per 24 hour  Intake 3120.38 ml  Output 2050 ml  Net 1070.38 ml    12/18 1901 - 12/20 0700 In: 4810.8 [P.O.:1320; I.V.:3133.1] Out: 2600 [Urine:2600]  Filed Weights   01/30/20 2144  Weight: 134.7 kg    Physical Examination:    General-appears in no acute distress  Heart-S1-S2, regular, no murmur auscultated  Lungs-clear to auscultation bilaterally, no wheezing or crackles auscultated  Abdomen-soft, nontender, no organomegaly  Extremities-no edema in the lower extremities  Neuro-alert, oriented x3, no focal deficit noted   Status is: Inpatient  Dispo: The patient is from: Home              Anticipated d/c is to: Home              Anticipated d/c date is: 02/10/2020              Patient currently not stable for discharge  Barrier to discharge-ongoing treatment for rhabdomyolysis      Data Reviewed:   Recent Results (from the past 240 hour(s))  Urine culture     Status: Abnormal   Collection Time: 01/30/20  8:12 PM   Specimen: In/Out Cath Urine  Result Value Ref Range Status   Specimen Description   Final    IN/OUT CATH URINE Performed at [facility] [address] [place], [place] [address]    Special Requests   Final    NONE Performed at [facility], [place] [address], [place], [place] [address]    Culture MULTIPLE SPECIES  PRESENT, SUGGEST RECOLLECTION (A)  Final   Report Status 02/01/2020 FINAL  Final  Wound or Superficial Culture     Status: None   Collection Time: 01/30/20  9:33 PM   Specimen: Abscess; Wound  Result Value Ref Range Status   Specimen Description   Final    ABSCESS Performed at [facility] [address], [place], [place] [address]    Special Requests   Final    NONE Performed at [facility], [place] [address], [place], [place] [address]    Gram Stain NO WBC SEEN NO ORGANISMS SEEN   Final   Culture   Final    NORMAL SKIN FLORA Performed at [facility], [place] [address], [place], [place] [address]    Report Status 02/02/2020 FINAL  Final  Resp Panel by RT-PCR (Flu A&B, Covid) Nasopharyngeal Swab     Status: None   Collection Time: 01/30/20 11:08 PM   Specimen: Nasopharyngeal Swab; Nasopharyngeal(NP) swabs in vial transport medium  Result Value Ref Range Status   SARS Coronavirus 2 by RT PCR NEGATIVE NEGATIVE Final    Comment: (NOTE) SARS-CoV-2 target nucleic acids are NOT DETECTED.  The SARS-CoV-2 RNA is generally detectable in upper respiratory specimens during the acute phase of infection. The lowest concentration of SARS-CoV-2 viral copies this assay can detect is 138 copies/mL. A negative result does not preclude SARS-Cov-2 infection and should not be used as the sole basis for  treatment or other patient management decisions. A negative result may occur with  improper specimen collection/handling, submission of specimen other than nasopharyngeal swab, presence of viral mutation(s) within the areas targeted by this assay, and inadequate number of viral copies(<138 copies/mL). A negative result must be combined with clinical observations, patient history, and epidemiological information. The expected result is Negative.  Fact Sheet for Patients:  EntrepreneurPulse.com.au  Fact Sheet for Healthcare Providers:   IncredibleEmployment.be  This test is no t yet approved or cleared by the [place] FDA and  has been authorized for detection and/or diagnosis of SARS-CoV-2 by FDA under an Emergency Use Authorization (EUA). This EUA will remain  in effect (meaning this test can be used) for the duration of the COVID-19 declaration under Section 564(b)(1) of the Act, 21 U.S.C.section 360bbb-3(b)(1), unless the authorization is terminated  or revoked sooner.       Influenza A by PCR NEGATIVE NEGATIVE Final   Influenza B by PCR NEGATIVE NEGATIVE Final    Comment: (NOTE) The Xpert Xpress SARS-CoV-2/FLU/RSV plus assay is intended as an aid in the diagnosis of influenza from Nasopharyngeal swab specimens and should not be used as a sole basis for treatment. Nasal washings and aspirates are unacceptable for Xpert Xpress SARS-CoV-2/FLU/RSV testing.  Fact Sheet for Patients: EntrepreneurPulse.com.au  Fact Sheet for Healthcare Providers: IncredibleEmployment.be  This test is not yet approved or cleared by the [place] FDA and has been authorized for detection and/or diagnosis of SARS-CoV-2 by FDA under an Emergency Use Authorization (EUA). This EUA will remain in effect (meaning this test can be used) for the duration of the COVID-19 declaration under Section 564(b)(1) of the Act, 21 U.S.C. section 360bbb-3(b)(1), unless the authorization is terminated or revoked.  Performed at [facility], [place] [address], [place], [place] [address]   Culture, blood (routine x 2)     Status: None   Collection Time: 01/31/20  3:29 AM   Specimen: BLOOD  Result Value Ref Range Status   Specimen Description   Final    BLOOD RIGHT ANTECUBITAL Performed at [facility] [address] [place], [place] [address]    Special Requests   Final    BOTTLES DRAWN AEROBIC ONLY Blood Culture adequate volume Performed at  [facility] [address] [place], [place] [address]    Culture   Final    NO GROWTH 6 DAYS Performed at [facility], [place] [address] [place], [place] [address]    Report Status 02/06/2020 FINAL  Final  Culture, blood (routine x 2)     Status: None   Collection Time: 01/31/20  3:29 AM   Specimen: BLOOD  Result Value Ref Range Status   Specimen Description   Final    BLOOD BLOOD RIGHT FOREARM Performed at [facility] [address], [place], [place] [address]    Special Requests   Final    BOTTLES DRAWN AEROBIC ONLY Blood Culture adequate volume Performed at [facility] [address], [place], [place] [address]    Culture   Final    NO GROWTH 6 DAYS Performed at [facility], [place] [address] [place], [place] [address]    Report Status 02/06/2020 FINAL  Final  MRSA PCR Screening     Status: None   Collection Time: 02/08/20  3:30 PM   Specimen: Nasal Mucosa; Nasopharyngeal  Result Value Ref Range Status   MRSA by PCR NEGATIVE NEGATIVE  Final    Comment:        The GeneXpert MRSA Assay (FDA approved for NASAL specimens only), is one component of a comprehensive MRSA colonization surveillance program. It is not intended to diagnose MRSA infection nor to guide or monitor treatment for MRSA infections. Performed at [facility], [place] [address], [place], [place] [address]     No results for input(s): LIPASE, AMYLASE in the last 168 hours. No results for input(s): AMMONIA in the last 168 hours.  Cardiac Enzymes: Recent Labs  Lab 02/05/20 0411 02/06/20 0449 02/07/20 0517 02/08/20 0343 02/09/20 0410  CKTOTAL 5,629* 4,879* 2,155* 4,442* 3,396*   BNP (last 3 results) Recent Labs    01/30/20 1538  BNP CANCELED    ProBNP (last 3 results) No results for input(s): PROBNP in the last 8760 hours.  Studies:  No results found.     Oswald Hillock   [facility] If  7PM-7AM, please contact night-coverage at [email or web address], Office  [phone]   02/09/2020, 1:20 PM  LOS: 9 days

## 2020-02-09 NOTE — Anesthesia Preprocedure Evaluation (Deleted)
Anesthesia Evaluation  Patient identified by MRN, date of birth, ID band Patient awake    Reviewed: Allergy & Precautions, NPO status , Patient's Chart, lab work & pertinent test results  Airway        Dental   Pulmonary neg pulmonary ROS,           Cardiovascular negative cardio ROS       Neuro/Psych negative neurological ROS  negative psych ROS   GI/Hepatic negative GI ROS, Neg liver ROS,   Endo/Other  Morbid obesity (BMI 49)  Renal/GU negative Renal ROS  negative genitourinary   Musculoskeletal negative musculoskeletal ROS (+)   Abdominal   Peds  Hematology negative hematology ROS (+)   Anesthesia Other Findings Presented on 01/30/20 with rhabdo and PID c/f sepsis now with pain in both thighs and arms c/f myositis  Reproductive/Obstetrics                             Anesthesia Physical Anesthesia Plan  ASA: III  Anesthesia Plan: MAC   Post-op Pain Management:    Induction: Intravenous  PONV Risk Score and Plan: 2 and Propofol infusion, Treatment may vary due to age or medical condition and Midazolam  Airway Management Planned: Natural Airway  Additional Equipment:   Intra-op Plan:   Post-operative Plan:   Informed Consent: I have reviewed the patients History and Physical, chart, labs and discussed the procedure including the risks, benefits and alternatives for the proposed anesthesia with the patient or authorized representative who has indicated his/her understanding and acceptance.     Dental advisory given  Plan Discussed with: CRNA  Anesthesia Plan Comments:         Anesthesia Quick Evaluation

## 2020-02-09 NOTE — Plan of Care (Signed)
Plan of care reviewed. 

## 2020-02-10 ENCOUNTER — Ambulatory Visit: Payer: Medicaid Other

## 2020-02-10 DIAGNOSIS — L0291 Cutaneous abscess, unspecified: Secondary | ICD-10-CM | POA: Diagnosis not present

## 2020-02-10 DIAGNOSIS — A419 Sepsis, unspecified organism: Secondary | ICD-10-CM | POA: Diagnosis not present

## 2020-02-10 DIAGNOSIS — M6282 Rhabdomyolysis: Secondary | ICD-10-CM | POA: Diagnosis not present

## 2020-02-10 DIAGNOSIS — R6520 Severe sepsis without septic shock: Secondary | ICD-10-CM | POA: Diagnosis not present

## 2020-02-10 LAB — PHOSPHORUS: Phosphorus: 3.9 mg/dL (ref 2.5–4.6)

## 2020-02-10 LAB — CK: Total CK: 2802 U/L — ABNORMAL HIGH (ref 38–234)

## 2020-02-10 LAB — MAGNESIUM: Magnesium: 1.6 mg/dL — ABNORMAL LOW (ref 1.7–2.4)

## 2020-02-10 MED ORDER — MAGNESIUM SULFATE 2 GM/50ML IV SOLN
2.0000 g | Freq: Once | INTRAVENOUS | Status: AC
  Administered 2020-02-10: 11:00:00 2 g via INTRAVENOUS
  Filled 2020-02-10: qty 50

## 2020-02-10 MED ORDER — CEPHALEXIN 500 MG PO CAPS
500.0000 mg | ORAL_CAPSULE | Freq: Two times a day (BID) | ORAL | Status: AC
  Administered 2020-02-10 – 2020-02-12 (×6): 500 mg via ORAL
  Filled 2020-02-10 (×6): qty 1

## 2020-02-10 NOTE — Progress Notes (Signed)
[facility]  PROGRESS NOTE  GISSELL Sandoval MMH:[id number] DOB: 11-18-2000 DOA: 01/30/2020 PCP: Vevelyn Francois, NP   Brief HPI:   19 year old female with past medical history of rhabdomyolysis, pelvic inflammatory disease.  She had recent patient at [facility] hospital for abdomen is then possible PID.  Initial CK was elevated at 31,000.  CT was concerning for PID.  She was empirically started on ceftriaxone, doxycycline and metronidazole.  Patient was also started on steroids for rhabdomyolysis.  She was found to have small abscess on right buttock which was drained by the ER physician and started on empiric antibiotics.  Also complained of pain in both thighs and arms, concern for myositis.  General surgery was consulted for possible muscle biopsy on Monday.    Subjective   Patient seen and examined, denies any complaints.  CK is down to 2802.  Muscle biopsy was canceled yesterday after discussion with rheumatology.   Assessment/Plan:      Severe sepsis-likely from PID versus right buttock abscess she met sepsis criteria on admission.  Sepsis has currently resolved.  Patient is currently on ceftriaxone and Zithromax.  Both Zithromax and ceftriaxone have been discontinued.  Will start Keflex 500 mg p.o. twice daily for 5 more days. Dr. Jarvis Morgan OB/GYN was consulted , recommended transvaginal ultrasound and chlamydia and gonorrhea probe.   Chlamydia and gonorrhea is negative.  Transvaginal ultrasound unremarkable.  Rhabdomyolysis-CK is down to 2802  It was significantly elevated to 31,230.  Patient is significantly fluid overload +18 L, she does not have any shortness of breath.  Will cut down IV fluids to 75 mL/h.   Follow CK in a.m.  ?  Myositis-myositis panel is negative so far, general surgery was consulted for possible muscle biopsy for myositis. Called and discussed with rheumatologist, Dr. Benjamine Mola at [facility] rheumatology.  He recommends to hold off on biopsy at this time as  her CK has significantly improved without being on prednisone.He will see her as outpatient.  Patient already has an appointment scheduled to see rheumatology as outpatient.  Transaminitis-likely from rhabdomyolysis.  Acute hepatitis panel is negative.  LFTs trending down.  Hypertension-started on metoprolol 25 mg twice a day  Hypomagnesemia-magnesium 1.6 today.  Will replace magnesium and follow magnesium level in a.m.  Morbid obesity-patient has morbid obesity, will benefit from weight loss program as outpatient.     COVID-19 Labs  No results for input(s): DDIMER, FERRITIN, LDH, CRP in the last 72 hours.  Lab Results  Component Value Date   SARSCOV2NAA NEGATIVE 01/30/2020   [place] NEGATIVE 01/18/2020     Scheduled medications:   . cephALEXin  500 mg Oral Q12H  . Chlorhexidine Gluconate Cloth  6 each Topical Daily  . enoxaparin (LOVENOX) injection  40 mg Subcutaneous Q24H  . metoprolol tartrate  25 mg Oral BID  . metroNIDAZOLE  500 mg Oral Q8H  . oxyCODONE  10 mg Oral Q12H  . polyethylene glycol  17 g Oral BID  . senna  1 tablet Oral QHS     SpO2: 100 %    CBC: Recent Labs  Lab 02/04/20 0310 02/05/20 0411 02/06/20 0449 02/07/20 0517 02/08/20 0343  WBC 13.9* 13.0* 13.4* 13.0* 10.9*  NEUTROABS 10.1* 8.8* 9.9*  --   --   HGB 10.8* 10.8* 10.5* 10.3* 10.0*  HCT 33.2* 33.4* 33.1* 32.1* 31.1*  MCV 88.5 89.3 89.7 89.7 90.1  PLT 307 319 326 324 159    Basic Metabolic Panel: Recent Labs  Lab 02/04/20 0310 02/05/20  1194 02/06/20 0449 02/07/20 0517 02/08/20 0343 02/09/20 0410 02/10/20 0338  NA 137 139 137 140 139  --   --   K 3.6 3.7 3.7 3.6 3.5  --   --   CL 105 105 103 104 104  --   --   CO2 '25 25 25 26 26  ' --   --   GLUCOSE 103* 98 105* 95 94  --   --   BUN '8 8 9 7 6  ' --   --   CREATININE 0.42* 0.45 0.48 0.47 0.42*  --   --   CALCIUM 8.0* 7.9* 8.0* 8.2* 7.9*  --   --   MG 1.7 1.6* 1.7 1.7 1.6* 1.9 1.6*  PHOS 3.4 4.0 3.7 3.9 4.3 4.6 3.9      Liver Function Tests: Recent Labs  Lab 02/04/20 0310 02/05/20 0411 02/06/20 0449  AST 151* 142* 143*  ALT 45* 44 47*  ALKPHOS 31* 34* 32*  BILITOT 0.4 0.3 0.2*  PROT 4.9* 4.8* 4.9*  ALBUMIN 2.1* 2.1* 2.2*     Antibiotics: Anti-infectives (From admission, onward)   Start     Dose/Rate Route Frequency Ordered Stop   02/10/20 1045  cephALEXin (KEFLEX) capsule 500 mg        500 mg Oral Every 12 hours 02/10/20 0950 02/13/20 0959   02/09/20 0600  ceFAZolin (ANCEF) 3 g in dextrose 5 % 50 mL IVPB        3 g 100 mL/hr over 30 Minutes Intravenous On call to O.R. 02/07/20 1111 02/10/20 0559   02/02/20 1600  cefTRIAXone (ROCEPHIN) 2 g in sodium chloride 0.9 % 100 mL IVPB  Status:  Discontinued        2 g 200 mL/hr over 30 Minutes Intravenous Every 24 hours 02/02/20 1012 02/10/20 0950   02/02/20 1430  azithromycin (ZITHROMAX) 500 mg in sodium chloride 0.9 % 250 mL IVPB  Status:  Discontinued        500 mg 250 mL/hr over 60 Minutes Intravenous Daily 02/02/20 1335 02/08/20 1639   02/02/20 1100  metroNIDAZOLE (FLAGYL) tablet 500 mg        500 mg Oral Every 8 hours 02/02/20 1012     02/02/20 1100  doxycycline (VIBRA-TABS) tablet 100 mg  Status:  Discontinued        100 mg Oral Every 12 hours 02/02/20 1012 02/02/20 1335   01/31/20 1200  vancomycin (VANCOREADY) IVPB 1250 mg/250 mL  Status:  Discontinued        1,250 mg 166.7 mL/hr over 90 Minutes Intravenous Every 8 hours 01/31/20 0230 01/31/20 1052   01/31/20 1200  vancomycin (VANCOREADY) IVPB 1500 mg/300 mL  Status:  Discontinued        1,500 mg 150 mL/hr over 120 Minutes Intravenous 2 times daily 01/31/20 1052 02/02/20 1012   01/31/20 1000  ceFEPIme (MAXIPIME) 2 g in sodium chloride 0.9 % 100 mL IVPB  Status:  Discontinued        2 g 200 mL/hr over 30 Minutes Intravenous Every 8 hours 01/31/20 0226 02/02/20 1012   01/31/20 0330  vancomycin (VANCOREADY) IVPB 2000 mg/400 mL        2,000 mg 200 mL/hr over 120 Minutes Intravenous   Once 01/31/20 0146 01/31/20 0517   01/31/20 0300  ceFEPIme (MAXIPIME) 2 g in sodium chloride 0.9 % 100 mL IVPB        2 g 200 mL/hr over 30 Minutes Intravenous  Once 01/31/20 0146 01/31/20 0247  01/30/20 2300  ceFAZolin (ANCEF) IVPB 1 g/50 mL premix        1 g 100 mL/hr over 30 Minutes Intravenous  Once 01/30/20 2258 01/30/20 2332       DVT prophylaxis: Lovenox  Code Status: Full code  Family Communication: No family at bedside   Consultants:    Procedures:      Objective   Vitals:   02/10/20 0208 02/10/20 0631 02/10/20 1003 02/10/20 1341  BP: (!) 139/51 (!) 156/96 (!) 149/69 (!) 149/52  Pulse: (!) 114 (!) 117 (!) 109 (!) 105  Resp: '18 16 18 17  ' Temp: 98.5 F (36.9 C) 98.3 F (36.8 C) 98.8 F (37.1 C) 98.3 F (36.8 C)  TempSrc: Oral Oral    SpO2: 99% 98% 99% 100%  Weight:      Height:        Intake/Output Summary (Last 24 hours) at 02/10/2020 1436 Last data filed at 02/10/2020 0951 Gross per 24 hour  Intake 3534.45 ml  Output 3350 ml  Net 184.45 ml    12/19 1901 - 12/21 0700 In: 5322.1 [P.O.:840; I.V.:4382.1] Out: 4300 [Urine:4300]  Filed Weights   01/30/20 2144  Weight: 134.7 kg    Physical Examination:    General-appears in no acute distress  Heart-S1-S2, regular, no murmur auscultated  Lungs-clear to auscultation bilaterally, no wheezing or crackles auscultated  Abdomen-soft, nontender, no organomegaly  Extremities-no edema in the lower extremities  Neuro-alert, oriented x3, no focal deficit noted   Status is: Inpatient  Dispo: The patient is from: Home              Anticipated d/c is to: Home              Anticipated d/c date is: 02/12/2020              Patient currently not stable for discharge  Barrier to discharge-ongoing treatment for rhabdomyolysis      Data Reviewed:   Recent Results (from the past 240 hour(s))  MRSA PCR Screening     Status: None   Collection Time: 02/08/20  3:30 PM   Specimen: Nasal Mucosa;  Nasopharyngeal  Result Value Ref Range Status   MRSA by PCR NEGATIVE NEGATIVE Final    Comment:        The GeneXpert MRSA Assay (FDA approved for NASAL specimens only), is one component of a comprehensive MRSA colonization surveillance program. It is not intended to diagnose MRSA infection nor to guide or monitor treatment for MRSA infections. Performed at [facility], [place] [address], [place], [place] [address]     No results for input(s): LIPASE, AMYLASE in the last 168 hours. No results for input(s): AMMONIA in the last 168 hours.  Cardiac Enzymes: Recent Labs  Lab 02/06/20 0449 02/07/20 0517 02/08/20 0343 02/09/20 0410 02/10/20 0338  CKTOTAL 4,879* 2,155* 4,442* 3,396* 2,802*   BNP (last 3 results) Recent Labs    01/30/20 1538  BNP CANCELED    ProBNP (last 3 results) No results for input(s): PROBNP in the last 8760 hours.  Studies:  No results found.     Oswald Hillock   [facility] If 7PM-7AM, please contact night-coverage at [email or web address], Office  [phone]   02/10/2020, 2:36 PM  LOS: 10 days

## 2020-02-11 DIAGNOSIS — R748 Abnormal levels of other serum enzymes: Secondary | ICD-10-CM | POA: Diagnosis not present

## 2020-02-11 DIAGNOSIS — D649 Anemia, unspecified: Secondary | ICD-10-CM

## 2020-02-11 DIAGNOSIS — M6282 Rhabdomyolysis: Secondary | ICD-10-CM | POA: Diagnosis not present

## 2020-02-11 DIAGNOSIS — E8770 Fluid overload, unspecified: Secondary | ICD-10-CM

## 2020-02-11 LAB — CK: Total CK: 1967 U/L — ABNORMAL HIGH (ref 38–234)

## 2020-02-11 LAB — PHOSPHORUS: Phosphorus: 4.2 mg/dL (ref 2.5–4.6)

## 2020-02-11 LAB — MAGNESIUM: Magnesium: 1.8 mg/dL (ref 1.7–2.4)

## 2020-02-11 MED ORDER — FUROSEMIDE 10 MG/ML IJ SOLN
40.0000 mg | Freq: Once | INTRAMUSCULAR | Status: AC
  Administered 2020-02-11: 14:00:00 40 mg via INTRAVENOUS
  Filled 2020-02-11: qty 4

## 2020-02-11 NOTE — Evaluation (Signed)
Physical Therapy Evaluation Patient Details Name: Haley Sandoval MRN: [id number] DOB: June 12, 2000 Today's Date: 02/11/2020   History of Present Illness  19 year old female with past medical history of rhabdomyolysis, pelvic inflammatory disease admitted with sepsis 2* pelvic inflammatory disease, R buttock abscess.  Clinical Impression  Pt admitted with above diagnosis. Pt ambulated 43' with IV pole without loss of balance, distance limited by 2/4 dyspnea, HR 125 one minute after ambulating. Pt fatigues quickly. She reports her "whole body feels swollen", which makes all movement labored.  Pt currently with functional limitations due to the deficits listed below (see PT Problem List). Pt will benefit from skilled PT to increase their independence and safety with mobility to allow discharge to the venue listed below.       Follow Up Recommendations No PT follow up    Equipment Recommendations  3in1 (PT)    Recommendations for Other Services       Precautions / Restrictions Precautions Precautions: Other (comment) Precaution Comments: monitor HR Restrictions Weight Bearing Restrictions: No      Mobility  Bed Mobility Overal bed mobility: Modified Independent Bed Mobility: Supine to Sit     Supine to sit: Modified independent (Device/Increase time)     General bed mobility comments: HOB up, used rail    Transfers Overall transfer level: Modified independent Equipment used: None Transfers: Sit to/from Stand Sit to Stand: Modified independent (Device/Increase time)         General transfer comment: increased time, labored movement  Ambulation/Gait Ambulation/Gait assistance: Supervision Gait Distance (Feet): 80 Feet Assistive device: IV Pole Gait Pattern/deviations: Decreased step length - right;Decreased step length - left;Decreased stride length Gait velocity: decreased   General Gait Details: pt reports edema in BLEs makes walking difficult, 2/4 dyspnea while  ambulating, HR 125 after 1 minute of seated rest after walking  Stairs            Wheelchair Mobility    Modified Rankin (Stroke Patients Only)       Balance Overall balance assessment: Needs assistance Sitting-balance support: Feet supported;No upper extremity supported Sitting balance-Leahy Scale: Good Sitting balance - Comments: seated at EOB   Standing balance support: During functional activity;No upper extremity supported Standing balance-Leahy Scale: Good                               Pertinent Vitals/Pain Pain Assessment: 0-10 Pain Score: 8  Pain Location: "whole body" Pain Descriptors / Indicators: Aching;Sore Pain Intervention(s): Limited activity within patient's tolerance;Monitored during session;Premedicated before session    Home Living Family/patient expects to be discharged to:: Private residence Living Arrangements: Parent Available Help at Discharge: Family;Available PRN/intermittently Type of Home: House Home Access: Stairs to enter Entrance Stairs-Rails: Right Entrance Stairs-Number of Steps: 3 Home Layout: One level Home Equipment: None Additional Comments: lives with mother who works    Prior Function Level of Independence: Independent         Comments: Tourist information centre manager, drives, works at a [employer]        Extremity/Trunk Assessment   Upper Extremity Assessment Upper Extremity Assessment: Generalized weakness (pt reports she can't reach overhead bc of swelling in her arms)    Lower Extremity Assessment Lower Extremity Assessment: Generalized weakness (B knee extension +4/5; generalized edema in BUEs and LEs)    Cervical / Trunk Assessment Cervical / Trunk Assessment: Normal  Communication   Communication: No difficulties  Cognition  Arousal/Alertness: Awake/alert Behavior During Therapy: WFL for tasks assessed/performed Overall Cognitive Status: Within Functional Limits for tasks  assessed                                        General Comments      Exercises General Exercises - Lower Extremity Ankle Circles/Pumps: AROM;Both;10 reps;Supine   Assessment/Plan    PT Assessment Patient needs continued PT services  PT Problem List Decreased strength;Decreased activity tolerance;Decreased balance;Decreased mobility       PT Treatment Interventions Balance training;DME instruction;Gait training;Stair training;Functional mobility training;Therapeutic activities;Patient/family education;Therapeutic exercise    PT Goals (Current goals can be found in the Care Plan section)  Acute Rehab PT Goals Patient Stated Goal: return home with family to assist PT Goal Formulation: With patient Time For Goal Achievement: 02/25/20 Potential to Achieve Goals: Good    Frequency Min 3X/week   Barriers to discharge        Co-evaluation               AM-PAC PT "6 Clicks" Mobility  Outcome Measure Help needed turning from your back to your side while in a flat bed without using bedrails?: None Help needed moving from lying on your back to sitting on the side of a flat bed without using bedrails?: A Little Help needed moving to and from a bed to a chair (including a wheelchair)?: A Little Help needed standing up from a chair using your arms (e.g., wheelchair or bedside chair)?: None Help needed to walk in hospital room?: A Little Help needed climbing 3-5 steps with a railing? : A Little 6 Click Score: 20    End of Session Equipment Utilized During Treatment: Gait belt Activity Tolerance: Patient limited by fatigue;Patient limited by pain Patient left: with call bell/phone within reach;in chair;with chair alarm set Nurse Communication: Mobility status PT Visit Diagnosis: Other abnormalities of gait and mobility (R26.89);Muscle weakness (generalized) (M62.81)    Time: 1884-1660 PT Time Calculation (min) (ACUTE ONLY): 20 min   Charges:   PT  Evaluation $PT Eval Low Complexity: 1 Low         Ralene Bathe Kistler PT 02/11/2020  Acute Rehabilitation Services Pager [phone] Office [phone]

## 2020-02-11 NOTE — Progress Notes (Signed)
PROGRESS NOTE    Haley Sandoval  EXB:[id number] DOB: 04-Apr-2000 DOA: 01/30/2020 PCP: Barbette Merino, NP    Chief Complaint  Patient presents with   Abscess    Brief Narrative:  19 year old female  had recent patient at [facility] hospital for abdomen pain, and myositis Initial CK was elevated at 31,000.  CT was concerning for PID.  She was empirically started on ceftriaxone, doxycycline and metronidazole.  Patient was also started on steroids for rhabdomyolysis.  she was discharged home on 12/2 She came back to the ED on 12/11 and  was found to have small abscess on right buttock which was drained by the ER physician and started on empiric antibiotics.  Also complained of pain in both thighs and arms, concern for myositis  Subjective:  Reports being swollen, not able to tell if pain and weakness is better or not due to being swollen  Assessment & Plan:   Principal Problem:   Sepsis (HCC) Active Problems:   Rhabdomyolysis   Elevated liver enzymes   Obesity, Class III, BMI 40-49.9 (morbid obesity) (HCC)   Cellulitis   Morbidly obese (HCC)  Sepsis present on admission -On admission meets criteria for sepsis HR> 90,> 12 K, lactic acid> 2  -Source of infection abscess of right buttock ( drained  In the ER), and possible PID -She received vancomycin and cefepime from December 11 to December 13,  Then Rocephin from December 13 to December 21, Zithromax from February 02, 2019 December 19, currently she is on Keflex with end date on December 23 -She has no fever, leukocytosis has improved  Volume overload/generalized edema D/c ivf, give iv lasix, encourage fluids intake, encourage activity, start physical therapy  repeat lab in am  Rhabdomyolysis -Possible myositis, negative ana, negative anti ssa/anti ssb, initial planned muscle biopsy canceled after Dr. Sharl Ma discussion with rheumatology Dr. Dimple Casey -Current plan to hold off biopsy as her CK has significantly improved without taking  prednisone -Patient will follow up with Dr. Dimple Casey -+18 L, DC IV fluids, encourage oral intake  Transaminitis likely from rhabdomyolysis Acute hepatitis panel negative LFT trending down Already have GI follow-up appointment  Hypertension/sinus tachycardia Continue beta-blocker Start Lasix as signs of volume overload  Normocytic anemia No sign of blood loss Possibly from volume overload, start IV Lasix Monitor CBC  Hypomagnesemia, replace  Repeat CT pelvis shows persistent pelvic inflammatory disease with some improvement.  chlamydia and gonorrhea probe negative transvaginal ultrasound unremarkable OB/GYN Dr. Vergie Living has signed off  Class III obesity Body mass index is 49.42 kg/m. Will benefit from outpatient weight loss program  DVT prophylaxis: SCD's Start: 02/09/20 0000 enoxaparin (LOVENOX) injection 40 mg Start: 02/08/20 1800 SCDs Start: 01/31/20 0148   Code Status:full Family Communication: Patient Disposition:   Status is: Inpatient  Dispo: The patient is from: Home              Anticipated d/c is to: Home              Anticipated d/c date is: TBD, currently significant volume overload                Consultants:   General surgery  Rheumatology  OB/GYN  Procedures:   Transvaginal ultrasound  Antimicrobials:     vancomycin and cefepime from December 11 to December 13,  Then Rocephin from December 13 to December 21, Zithromax from February 02, 2019 December 19, currently she is on Keflex with end date on December 23     Objective:  Vitals:   02/10/20 1341 02/10/20 1728 02/10/20 2151 02/11/20 0639  BP: (!) 149/52 134/66 (!) 134/58 (!) 137/50  Pulse: (!) 105 (!) 109 (!) 110 (!) 105  Resp: 17 18 16 14   Temp: 98.3 F (36.8 C) 98.8 F (37.1 C) 99.9 F (37.7 C) 98.5 F (36.9 C)  TempSrc:   Oral Oral  SpO2: 100% 100% 100% 100%  Weight:      Height:        Intake/Output Summary (Last 24 hours) at 02/11/2020 1203 Last data filed at 02/11/2020  02/13/2020 Gross per 24 hour  Intake 1688.07 ml  Output 1600 ml  Net 88.07 ml   Filed Weights   01/30/20 2144  Weight: 134.7 kg    Examination:  General exam: calm, NAD Respiratory system: Diminished at bases , no rales , no wheezing  Respiratory effort normal. Cardiovascular system: Tachycardia  Gastrointestinal system: Abdomen is nondistended, soft and nontender. Normal bowel sounds heard. Central nervous system: Alert and oriented. No focal neurological deficits. Extremities: Symmetric 5 x 5 power.  Generalized edema Skin: No rashes, lesions or ulcers Psychiatry: Judgement and insight appear normal. Mood & affect appropriate.     Data Reviewed: I have personally reviewed following labs and imaging studies  CBC: Recent Labs  Lab 02/05/20 0411 02/06/20 0449 02/07/20 0517 02/08/20 0343  WBC 13.0* 13.4* 13.0* 10.9*  NEUTROABS 8.8* 9.9*  --   --   HGB 10.8* 10.5* 10.3* 10.0*  HCT 33.4* 33.1* 32.1* 31.1*  MCV 89.3 89.7 89.7 90.1  PLT 319 326 324 353    Basic Metabolic Panel: Recent Labs  Lab 02/05/20 0411 02/06/20 0449 02/07/20 0517 02/08/20 0343 02/09/20 0410 02/10/20 0338 02/11/20 0410  NA 139 137 140 139  --   --   --   K 3.7 3.7 3.6 3.5  --   --   --   CL 105 103 104 104  --   --   --   CO2 25 25 26 26   --   --   --   GLUCOSE 98 105* 95 94  --   --   --   BUN 8 9 7 6   --   --   --   CREATININE 0.45 0.48 0.47 0.42*  --   --   --   CALCIUM 7.9* 8.0* 8.2* 7.9*  --   --   --   MG 1.6* 1.7 1.7 1.6* 1.9 1.6* 1.8  PHOS 4.0 3.7 3.9 4.3 4.6 3.9 4.2    GFR: Estimated Creatinine Clearance: 156 mL/min (A) (by C-G formula based on SCr of 0.42 mg/dL (L)).  Liver Function Tests: Recent Labs  Lab 02/05/20 0411 02/06/20 0449  AST 142* 143*  ALT 44 47*  ALKPHOS 34* 32*  BILITOT 0.3 0.2*  PROT 4.8* 4.9*  ALBUMIN 2.1* 2.2*    CBG: No results for input(s): GLUCAP in the last 168 hours.   Recent Results (from the past 240 hour(s))  MRSA PCR Screening      Status: None   Collection Time: 02/08/20  3:30 PM   Specimen: Nasal Mucosa; Nasopharyngeal  Result Value Ref Range Status   MRSA by PCR NEGATIVE NEGATIVE Final    Comment:        The GeneXpert MRSA Assay (FDA approved for NASAL specimens only), is one component of a comprehensive MRSA colonization surveillance program. It is not intended to diagnose MRSA infection nor to guide or monitor treatment for MRSA infections. Performed at 02/07/20  [facility], [address] [place], [place] [address]          Radiology Studies: No results found.      Scheduled Meds:  cephALEXin  500 mg Oral Q12H   Chlorhexidine Gluconate Cloth  6 each Topical Daily   enoxaparin (LOVENOX) injection  40 mg Subcutaneous Q24H   furosemide  40 mg Intravenous Once   metoprolol tartrate  25 mg Oral BID   metroNIDAZOLE  500 mg Oral Q8H   oxyCODONE  10 mg Oral Q12H   polyethylene glycol  17 g Oral BID   senna  1 tablet Oral QHS   Continuous Infusions:   LOS: 11 days   Time spent: Greater than 50% of this time was spent in counseling, explanation of diagnosis, planning of further management, and coordination of care.  I have personally reviewed and interpreted on  02/11/2020 daily labs,I reviewed all nursing notes, pharmacy notes, consultant notes,  vitals, pertinent old records  I have discussed plan of care as described above with RN , patient on 02/11/2020  Voice Recognition /Dragon dictation system was used to create this note, attempts have been made to correct errors. Please contact the author with questions and/or clarifications.   Albertine Grates, MD PhD FACP [facility]  Available via Epic secure chat 7am-7pm for nonurgent issues Please page for urgent issues To page the attending provider between 7A-7P or the covering provider during after hours 7P-7A, please log into the web site [email or web address] and access using universal [place] password for that  web site. If you do not have the password, please call the hospital operator.    02/11/2020, 12:03 PM

## 2020-02-11 NOTE — Plan of Care (Signed)
Plan of care reviewed. 

## 2020-02-12 ENCOUNTER — Inpatient Hospital Stay (HOSPITAL_COMMUNITY): Payer: Medicaid Other

## 2020-02-12 DIAGNOSIS — E8770 Fluid overload, unspecified: Secondary | ICD-10-CM | POA: Diagnosis not present

## 2020-02-12 DIAGNOSIS — R600 Localized edema: Secondary | ICD-10-CM | POA: Diagnosis not present

## 2020-02-12 DIAGNOSIS — M6282 Rhabdomyolysis: Secondary | ICD-10-CM | POA: Diagnosis not present

## 2020-02-12 DIAGNOSIS — D649 Anemia, unspecified: Secondary | ICD-10-CM | POA: Diagnosis not present

## 2020-02-12 DIAGNOSIS — L03221 Cellulitis of neck: Secondary | ICD-10-CM | POA: Diagnosis not present

## 2020-02-12 DIAGNOSIS — R748 Abnormal levels of other serum enzymes: Secondary | ICD-10-CM | POA: Diagnosis not present

## 2020-02-12 LAB — CBC WITH DIFFERENTIAL/PLATELET
Abs Immature Granulocytes: 0.29 10*3/uL — ABNORMAL HIGH (ref 0.00–0.07)
Basophils Absolute: 0.1 10*3/uL (ref 0.0–0.1)
Basophils Relative: 1 %
Eosinophils Absolute: 0.3 10*3/uL (ref 0.0–0.5)
Eosinophils Relative: 2 %
HCT: 30.7 % — ABNORMAL LOW (ref 36.0–46.0)
Hemoglobin: 9.8 g/dL — ABNORMAL LOW (ref 12.0–15.0)
Immature Granulocytes: 3 %
Lymphocytes Relative: 17 %
Lymphs Abs: 2 10*3/uL (ref 0.7–4.0)
MCH: 29.1 pg (ref 26.0–34.0)
MCHC: 31.9 g/dL (ref 30.0–36.0)
MCV: 91.1 fL (ref 80.0–100.0)
Monocytes Absolute: 1.5 10*3/uL — ABNORMAL HIGH (ref 0.1–1.0)
Monocytes Relative: 13 %
Neutro Abs: 7.6 10*3/uL (ref 1.7–7.7)
Neutrophils Relative %: 64 %
Platelets: 393 10*3/uL (ref 150–400)
RBC: 3.37 MIL/uL — ABNORMAL LOW (ref 3.87–5.11)
RDW: 16.2 % — ABNORMAL HIGH (ref 11.5–15.5)
WBC: 11.7 10*3/uL — ABNORMAL HIGH (ref 4.0–10.5)
nRBC: 0 % (ref 0.0–0.2)

## 2020-02-12 LAB — COMPREHENSIVE METABOLIC PANEL
ALT: 33 U/L (ref 0–44)
AST: 71 U/L — ABNORMAL HIGH (ref 15–41)
Albumin: 2.8 g/dL — ABNORMAL LOW (ref 3.5–5.0)
Alkaline Phosphatase: 28 U/L — ABNORMAL LOW (ref 38–126)
Anion gap: 11 (ref 5–15)
BUN: 7 mg/dL (ref 6–20)
CO2: 25 mmol/L (ref 22–32)
Calcium: 8.7 mg/dL — ABNORMAL LOW (ref 8.9–10.3)
Chloride: 104 mmol/L (ref 98–111)
Creatinine, Ser: 0.56 mg/dL (ref 0.44–1.00)
GFR, Estimated: >60 mL/min (ref >60)
Glucose, Bld: 99 mg/dL (ref 70–99)
Potassium: 3.7 mmol/L (ref 3.5–5.1)
Sodium: 140 mmol/L (ref 135–145)
Total Bilirubin: 0.6 mg/dL (ref 0.3–1.2)
Total Protein: 5.8 g/dL — ABNORMAL LOW (ref 6.5–8.1)

## 2020-02-12 LAB — MAGNESIUM: Magnesium: 1.8 mg/dL (ref 1.7–2.4)

## 2020-02-12 LAB — ALDOLASE: Aldolase: 29.5 U/L — ABNORMAL HIGH (ref 3.3–10.3)

## 2020-02-12 LAB — CK: Total CK: 1740 U/L — ABNORMAL HIGH (ref 38–234)

## 2020-02-12 MED ORDER — POTASSIUM CHLORIDE CRYS ER 20 MEQ PO TBCR
40.0000 meq | EXTENDED_RELEASE_TABLET | Freq: Once | ORAL | Status: AC
  Administered 2020-02-12: 11:00:00 40 meq via ORAL
  Filled 2020-02-12: qty 2

## 2020-02-12 MED ORDER — MAGNESIUM SULFATE 2 GM/50ML IV SOLN
2.0000 g | Freq: Once | INTRAVENOUS | Status: AC
  Administered 2020-02-12: 11:00:00 2 g via INTRAVENOUS
  Filled 2020-02-12: qty 50

## 2020-02-12 MED ORDER — FUROSEMIDE 10 MG/ML IJ SOLN
40.0000 mg | Freq: Once | INTRAMUSCULAR | Status: AC
  Administered 2020-02-12: 11:00:00 40 mg via INTRAVENOUS
  Filled 2020-02-12: qty 4

## 2020-02-12 NOTE — Plan of Care (Signed)
Plan of care reviewed and discussed with the patient. 

## 2020-02-12 NOTE — Plan of Care (Signed)
  Problem: Education: Goal: Knowledge of General Education information will improve Description: Including pain rating scale, medication(s)/side effects and non-pharmacologic comfort measures Outcome: Progressing   Problem: Clinical Measurements: Goal: Cardiovascular complication will be avoided Outcome: Progressing   Problem: Clinical Measurements: Goal: Will remain free from infection Outcome: Progressing

## 2020-02-12 NOTE — Progress Notes (Signed)
PROGRESS NOTE    Haley Sandoval  ZDG:[id number] DOB: [birth year] DOA: 01/30/2020 PCP: Barbette Merino, NP    Chief Complaint  Patient presents with  . Abscess    Brief Narrative:  19 year old female  had recent patient at [facility] for abdomen pain, and myositis Initial CK was elevated at 31,000.  CT was concerning for PID.  She was empirically started on ceftriaxone, doxycycline and metronidazole.  Patient was also started on steroids for rhabdomyolysis.  she was discharged home on 12/2 She came back to the ED on 12/11 and  was found to have small abscess on right buttock which was drained by the ER physician and started on empiric antibiotics.  Also complained of pain in both thighs and arms, concern for myositis  Subjective:  remain swollen, not able to tell if pain and weakness is better or not due to being swollen Urinating a lot after lasix C/o left breast pain  Assessment & Plan:   Principal Problem:   Sepsis (HCC) Active Problems:   Rhabdomyolysis   Elevated liver enzymes   Obesity, Class III, BMI 40-49.9 (morbid obesity) (HCC)   Cellulitis   Morbidly obese (HCC)  Sepsis present on admission -On admission meets criteria for sepsis HR> 90,> 12 K, lactic acid> 2  -Source of infection abscess of right buttock ( drained  In the ER), and possible PID -She received vancomycin and cefepime from December 11 to December 13,  Then Rocephin from December 13 to December 21, Zithromax from February 02, 2019 December 19, currently she is on Keflex with end date on December 23 -She has no fever, leukocytosis has improved  Volume overload/generalized edema ivf  d/ced on 12/22, started iv lasix on 12/22,  encourage activity, start physical therapy  repeat lab in am  Rhabdomyolysis -Possible myositis, negative ana, negative anti ssa/anti ssb, initial planned muscle biopsy canceled after Dr. Sharl Ma discussion with rheumatology Dr. Dimple Casey -Current plan to hold off biopsy as her CK  has significantly improved without taking prednisone -Patient will follow up with Dr. Dimple Casey -+18 L, DC IV fluids, encourage oral intake  Transaminitis likely from rhabdomyolysis Acute hepatitis panel negative LFT trending down Already have GI follow-up appointment  Hypertension/sinus tachycardia Continue beta-blocker Started Lasix as signs of volume overload  Normocytic anemia No sign of blood loss, stool is brown Possibly from volume overload, start IV Lasix Monitor CBC  Hypomagnesemia, replace  Repeat CT pelvis shows persistent pelvic inflammatory disease with some improvement.  chlamydia and gonorrhea probe negative transvaginal ultrasound unremarkable OB/GYN Dr. Vergie Living has signed off  Class III obesity Body mass index is 49.42 kg/m. Will benefit from outpatient weight loss program  Left-sided breast pain, left breast appears bigger than the right side No erythema on exam, does has mild tenderness Will get breast ultrasound,   DVT prophylaxis: SCD's Start: 02/09/20 0000 enoxaparin (LOVENOX) injection 40 mg Start: 02/08/20 1800 SCDs Start: 01/31/20 0148   Code Status:full Family Communication: Patient, declined my offer to call her family, states she kept her mother updated over the phone Disposition:   Status is: Inpatient  Dispo: The patient is from: Home              Anticipated d/c is to: Home              Anticipated d/c date is: TBD, currently significant volume overload                Consultants:   General surgery  Rheumatology  OB/GYN  Procedures:   Transvaginal ultrasound  Antimicrobials:     vancomycin and cefepime from December 11 to December 13,  Then Rocephin from December 13 to December 21, Zithromax from February 02, 2019 December 19, currently she is on Keflex with end date on December 23     Objective: Vitals:   02/11/20 1204 02/11/20 1405 02/11/20 2123 02/12/20 0649  BP:  (!) 160/98 (!) 162/78 140/68  Pulse: (!) 125 100 (!)  105 100  Resp:  18 18 16   Temp:  98.8 F (37.1 C) 99.6 F (37.6 C) 98.9 F (37.2 C)  TempSrc:  Oral Oral Oral  SpO2:  100% 100% 100%  Weight:      Height:        Intake/Output Summary (Last 24 hours) at 02/12/2020 1149 Last data filed at 02/12/2020 1100 Gross per 24 hour  Intake 540 ml  Output 1301 ml  Net -761 ml   Filed Weights   01/30/20 2144  Weight: 134.7 kg    Examination:  General exam: calm, NAD Respiratory system: Diminished at bases , no rales , no wheezing  Respiratory effort normal. Cardiovascular system: less Tachycardia  Gastrointestinal system: Abdomen is nondistended, soft and nontender. Normal bowel sounds heard. Central nervous system: Alert and oriented. No focal neurological deficits. Extremities: Symmetric 5 x 5 power.  Generalized edema Skin: No rashes, lesions or ulcers Psychiatry: Judgement and insight appear normal. Mood & affect appropriate.     Data Reviewed: I have personally reviewed following labs and imaging studies  CBC: Recent Labs  Lab 02/06/20 0449 02/07/20 0517 02/08/20 0343 02/12/20 0330  WBC 13.4* 13.0* 10.9* 11.7*  NEUTROABS 9.9*  --   --  7.6  HGB 10.5* 10.3* 10.0* 9.8*  HCT 33.1* 32.1* 31.1* 30.7*  MCV 89.7 89.7 90.1 91.1  PLT 326 324 353 393    Basic Metabolic Panel: Recent Labs  Lab 02/06/20 0449 02/07/20 0517 02/08/20 0343 02/09/20 0410 02/10/20 0338 02/11/20 0410 02/12/20 0330  NA 137 140 139  --   --   --  140  K 3.7 3.6 3.5  --   --   --  3.7  CL 103 104 104  --   --   --  104  CO2 25 26 26   --   --   --  25  GLUCOSE 105* 95 94  --   --   --  99  BUN 9 7 6   --   --   --  7  CREATININE 0.48 0.47 0.42*  --   --   --  0.56  CALCIUM 8.0* 8.2* 7.9*  --   --   --  8.7*  MG 1.7 1.7 1.6* 1.9 1.6* 1.8 1.8  PHOS 3.7 3.9 4.3 4.6 3.9 4.2  --     GFR: Estimated Creatinine Clearance: 156 mL/min (by C-G formula based on SCr of 0.56 mg/dL).  Liver Function Tests: Recent Labs  Lab 02/06/20 0449  02/12/20 0330  AST 143* 71*  ALT 47* 33  ALKPHOS 32* 28*  BILITOT 0.2* 0.6  PROT 4.9* 5.8*  ALBUMIN 2.2* 2.8*    CBG: No results for input(s): GLUCAP in the last 168 hours.   Recent Results (from the past 240 hour(s))  MRSA PCR Screening     Status: None   Collection Time: 02/08/20  3:30 PM   Specimen: Nasal Mucosa; Nasopharyngeal  Result Value Ref Range Status   MRSA by PCR NEGATIVE NEGATIVE Final  Comment:        The GeneXpert MRSA Assay (FDA approved for NASAL specimens only), is one component of a comprehensive MRSA colonization surveillance program. It is not intended to diagnose MRSA infection nor to guide or monitor treatment for MRSA infections. Performed at [facility], [address] [place], [place] [address]          Radiology Studies: No results found.      Scheduled Meds: . cephALEXin  500 mg Oral Q12H  . Chlorhexidine Gluconate Cloth  6 each Topical Daily  . enoxaparin (LOVENOX) injection  40 mg Subcutaneous Q24H  . metoprolol tartrate  25 mg Oral BID  . metroNIDAZOLE  500 mg Oral Q8H  . oxyCODONE  10 mg Oral Q12H  . polyethylene glycol  17 g Oral BID  . senna  1 tablet Oral QHS   Continuous Infusions: . magnesium sulfate bolus IVPB 2 g (02/12/20 1057)     LOS: 12 days   Time spent: Greater than 50% of this time was spent in counseling, explanation of diagnosis, planning of further management, and coordination of care.  I have personally reviewed and interpreted on  02/12/2020 daily labs,I reviewed all nursing notes, pharmacy notes, consultant notes,  vitals, pertinent old records  I have discussed plan of care as described above with RN , patient on 02/12/2020  Voice Recognition /Dragon dictation system was used to create this note, attempts have been made to correct errors. Please contact the author with questions and/or clarifications.   Albertine Grates, MD PhD FACP [facility]  Available via  Epic secure chat 7am-7pm for nonurgent issues Please page for urgent issues To page the attending provider between 7A-7P or the covering provider during after hours 7P-7A, please log into the web site [email or web address] and access using universal [place] password for that web site. If you do not have the password, please call the hospital operator.    02/12/2020, 11:49 AM

## 2020-02-12 NOTE — Progress Notes (Addendum)
Physical Therapy Treatment Patient Details Name: Haley Sandoval MRN: [id number] DOB: May 17, 2000 Today's Date: 02/12/2020    History of Present Illness 19 year old female with past medical history of rhabdomyolysis, pelvic inflammatory disease admitted with sepsis 2* pelvic inflammatory disease, R buttock abscess.    PT Comments    Pt teary today, stated she's tired of being in the hospital and very uncomfortable from being swollen all over. She declined ambulation but agreed to chair level therapeutic exercises. Instructed pt in BUE/LE strengthening and ROM exercises. Pt isn't able to actively raise arms overhead without assistance, so shoulder flexion ROM was performed with assistance. Encouraged pt to perform exercises 2-3x/day. Issued level 2 theraband, stress ball, and handout with instructions for exercises.    Follow Up Recommendations  No PT follow up     Equipment Recommendations  3in1 (PT)    Recommendations for Other Services       Precautions / Restrictions Precautions Precautions: Other (comment) Precaution Comments: monitor HR Restrictions Weight Bearing Restrictions: No    Mobility  Bed Mobility               General bed mobility comments: up in recliner  Transfers                    Ambulation/Gait             General Gait Details: pt declined ambulation 2* feeling sad today, agreed to seated exercises   Stairs             Wheelchair Mobility    Modified Rankin (Stroke Patients Only)       Balance                                            Cognition Arousal/Alertness: Awake/alert Behavior During Therapy: WFL for tasks assessed/performed Overall Cognitive Status: Within Functional Limits for tasks assessed                                        Exercises General Exercises - Lower Extremity Ankle Circles/Pumps: AROM;Both;Supine;15 reps Quad Sets: AROM;Both;5  reps;Supine Gluteal Sets: AROM;Both;5 reps;Supine Long Arc Quad: AROM;Both;10 reps;Seated Shoulder Exercises Shoulder Flexion: AAROM;Both;10 reps;Seated Elbow Flexion: AROM;Both;Theraband;5 reps Theraband Level (Elbow Flexion): Level 2 (Red)    General Comments        Pertinent Vitals/Pain Pain Score: 7  Pain Location: "whole body from all the swelling" Pain Descriptors / Indicators: Aching;Sore Pain Intervention(s): Limited activity within patient's tolerance;Monitored during session;Patient requesting pain meds-RN notified;RN gave pain meds during session    Home Living                      Prior Function            PT Goals (current goals can now be found in the care plan section) Acute Rehab PT Goals Patient Stated Goal: return home with family to assist PT Goal Formulation: With patient Time For Goal Achievement: 02/25/20 Potential to Achieve Goals: Good Progress towards PT goals: Progressing toward goals    Frequency    Min 3X/week      PT Plan Current plan remains appropriate    Co-evaluation              AM-PAC PT "6  Clicks" Mobility   Outcome Measure  Help needed turning from your back to your side while in a flat bed without using bedrails?: None Help needed moving from lying on your back to sitting on the side of a flat bed without using bedrails?: A Little Help needed moving to and from a bed to a chair (including a wheelchair)?: A Little Help needed standing up from a chair using your arms (e.g., wheelchair or bedside chair)?: None Help needed to walk in hospital room?: A Little Help needed climbing 3-5 steps with a railing? : A Little 6 Click Score: 20    End of Session   Activity Tolerance: Patient limited by fatigue;Patient limited by pain Patient left: with call bell/phone within reach;in chair;with chair alarm set Nurse Communication: Mobility status PT Visit Diagnosis: Other abnormalities of gait and mobility  (R26.89);Muscle weakness (generalized) (M62.81)     Time: 9675-9163 PT Time Calculation (min) (ACUTE ONLY): 44 min  Charges:  $Therapeutic Exercise: 38-52 mins                    Ralene Bathe Kistler PT 02/12/2020  Acute Rehabilitation Services Pager [phone] Office [phone]

## 2020-02-13 DIAGNOSIS — E8770 Fluid overload, unspecified: Secondary | ICD-10-CM | POA: Diagnosis not present

## 2020-02-13 DIAGNOSIS — D649 Anemia, unspecified: Secondary | ICD-10-CM | POA: Diagnosis not present

## 2020-02-13 DIAGNOSIS — M6282 Rhabdomyolysis: Secondary | ICD-10-CM | POA: Diagnosis not present

## 2020-02-13 DIAGNOSIS — R748 Abnormal levels of other serum enzymes: Secondary | ICD-10-CM | POA: Diagnosis not present

## 2020-02-13 LAB — CK: Total CK: 1517 U/L — ABNORMAL HIGH (ref 38–234)

## 2020-02-13 LAB — OCCULT BLOOD X 1 CARD TO LAB, STOOL: Fecal Occult Bld: NEGATIVE

## 2020-02-13 LAB — MAGNESIUM: Magnesium: 1.9 mg/dL (ref 1.7–2.4)

## 2020-02-13 MED ORDER — FUROSEMIDE 10 MG/ML IJ SOLN
40.0000 mg | Freq: Every day | INTRAMUSCULAR | Status: AC
  Administered 2020-02-13 – 2020-02-15 (×3): 40 mg via INTRAVENOUS
  Filled 2020-02-13 (×3): qty 4

## 2020-02-13 NOTE — Progress Notes (Signed)
PROGRESS NOTE    Haley Sandoval  BHA:[id number] DOB: 06/11/2000 DOA: 01/30/2020 PCP: Barbette Merino, NP    Chief Complaint  Patient presents with  . Abscess    Brief Narrative:  19 year old female  had recent patient at [facility] for abdomen pain, and myositis Initial CK was elevated at 31,000.  CT was concerning for PID.  She was empirically started on ceftriaxone, doxycycline and metronidazole.  Patient was also started on steroids for rhabdomyolysis.  she was discharged home on 12/2 She came back to the ED on 12/11 and  was found to have small abscess on right buttock which was drained by the ER physician and started on empiric antibiotics.  Also complained of pain in both thighs and arms, concern for myositis  Subjective:  remain swollen, not able to tell if pain and weakness is better or not due to being swollen Urinating a lot after lasix C/o left breast pain, no fever  Assessment & Plan:   Principal Problem:   Sepsis (HCC) Active Problems:   Rhabdomyolysis   Elevated liver enzymes   Obesity, Class III, BMI 40-49.9 (morbid obesity) (HCC)   Cellulitis   Morbidly obese (HCC)  Sepsis present on admission -On admission meets criteria for sepsis HR> 90,> 12 K, lactic acid> 2  -Source of infection abscess of right buttock ( drained  In the ER), and possible PID -She received vancomycin and cefepime from December 11 to December 13,  Then Rocephin from December 13 to December 21, Zithromax from December 13 to December 19,  Flagyl from 12/13 to 12/24,  Keflex from 12/21 to December 23 -She has no fever, leukocytosis has improved  Volume overload/generalized edema ivf  d/ced on 12/22, started iv lasix on 12/22,  encourage activity, start physical therapy  repeat lab in am  Rhabdomyolysis -Possible myositis, negative ana, negative anti ssa/anti ssb, initial planned muscle biopsy canceled after Dr. Sharl Ma discussion with rheumatology Dr. Dimple Casey -Current plan to hold off  biopsy as her CK has significantly improved without taking prednisone -Patient will follow up with Dr. Dimple Casey -volume overloaded,  DC IV fluids, encourage oral intake  Transaminitis likely from rhabdomyolysis Acute hepatitis panel negative LFT trending down Already have GI follow-up appointment  Hypertension/sinus tachycardia Continue beta-blocker Started Lasix as signs of significant  volume overload  Normocytic anemia No sign of blood loss, stool is brown, FOBT negative  Possibly from volume overload, start IV Lasix Monitor CBC  Hypomagnesemia, replaced,. monitor  Repeat CT pelvis shows persistent pelvic inflammatory disease with some improvement.  chlamydia and gonorrhea probe negative transvaginal ultrasound unremarkable She finished treatment with rocephin and flagyl OB/GYN Dr. Vergie Living has signed off  Class III obesity Body mass index is 49.42 kg/m. Will benefit from outpatient weight loss program  Left-sided breast pain, left breast appears bigger than the right side No erythema on exam, does has mild tenderness breast ultrasound no fluids collection, from edema, ? Monitor off abx  DVT prophylaxis: SCD's Start: 02/09/20 0000 enoxaparin (LOVENOX) injection 40 mg Start: 02/08/20 1800 SCDs Start: 01/31/20 0148   Code Status:full Family Communication: Patient, declined my offer to call her family, states she kept her mother updated over the phone Disposition:   Status is: Inpatient  Dispo: The patient is from: Home              Anticipated d/c is to: Home              Anticipated d/c date is: TBD, currently significant  volume overload                Consultants:   General surgery  Rheumatology  OB/GYN  Procedures:   Transvaginal ultrasound  Antimicrobials:    vancomycin and cefepime from December 11 to December 13,  Then Rocephin from December 13 to December 21, Zithromax from December 13 to December 19,  Flagyl from 12/13 to 12/24,  Keflex from 12/21  to December 23     Objective: Vitals:   02/12/20 0649 02/12/20 1450 02/12/20 2100 02/13/20 0523  BP: 140/68 (!) 155/99 116/90 (!) 141/61  Pulse: 100 95  (!) 108  Resp: 16 16 16 16   Temp: 98.9 F (37.2 C) 98 F (36.7 C) 98.7 F (37.1 C) 99.1 F (37.3 C)  TempSrc: Oral Oral Oral Oral  SpO2: 100% 100% 100% 100%  Weight:      Height:        Intake/Output Summary (Last 24 hours) at 02/13/2020 0846 Last data filed at 02/13/2020 0700 Gross per 24 hour  Intake 780 ml  Output 2501 ml  Net -1721 ml   Filed Weights   01/30/20 2144  Weight: 134.7 kg    Examination:  General exam: calm, NAD Respiratory system: Diminished at bases , no rales , no wheezing  Respiratory effort normal. Cardiovascular system: less Tachycardia  Gastrointestinal system: Abdomen is nondistended, soft and nontender. Normal bowel sounds heard. Central nervous system: Alert and oriented. No focal neurological deficits. Extremities: Symmetric 5 x 5 power.  Generalized edema Skin: No rashes, lesions or ulcers Psychiatry: Judgement and insight appear normal. Mood & affect appropriate.     Data Reviewed: I have personally reviewed following labs and imaging studies  CBC: Recent Labs  Lab 02/07/20 0517 02/08/20 0343 02/12/20 0330  WBC 13.0* 10.9* 11.7*  NEUTROABS  --   --  7.6  HGB 10.3* 10.0* 9.8*  HCT 32.1* 31.1* 30.7*  MCV 89.7 90.1 91.1  PLT 324 353 393    Basic Metabolic Panel: Recent Labs  Lab 02/07/20 0517 02/08/20 0343 02/09/20 0410 02/10/20 0338 02/11/20 0410 02/12/20 0330 02/13/20 0327  NA 140 139  --   --   --  140  --   K 3.6 3.5  --   --   --  3.7  --   CL 104 104  --   --   --  104  --   CO2 26 26  --   --   --  25  --   GLUCOSE 95 94  --   --   --  99  --   BUN 7 6  --   --   --  7  --   CREATININE 0.47 0.42*  --   --   --  0.56  --   CALCIUM 8.2* 7.9*  --   --   --  8.7*  --   MG 1.7 1.6* 1.9 1.6* 1.8 1.8 1.9  PHOS 3.9 4.3 4.6 3.9 4.2  --   --      GFR: Estimated Creatinine Clearance: 156 mL/min (by C-G formula based on SCr of 0.56 mg/dL).  Liver Function Tests: Recent Labs  Lab 02/12/20 0330  AST 71*  ALT 33  ALKPHOS 28*  BILITOT 0.6  PROT 5.8*  ALBUMIN 2.8*    CBG: No results for input(s): GLUCAP in the last 168 hours.   Recent Results (from the past 240 hour(s))  MRSA PCR Screening     Status: None  Collection Time: 02/08/20  3:30 PM   Specimen: Nasal Mucosa; Nasopharyngeal  Result Value Ref Range Status   MRSA by PCR NEGATIVE NEGATIVE Final    Comment:        The GeneXpert MRSA Assay (FDA approved for NASAL specimens only), is one component of a comprehensive MRSA colonization surveillance program. It is not intended to diagnose MRSA infection nor to guide or monitor treatment for MRSA infections. Performed at [facility], [address] [place], [place] [address]          Radiology Studies: [place] CHEST SOFT TISSUE  Result Date: 02/12/2020 CLINICAL DATA:  Left periareolar cellulitis. EXAM: ULTRASOUND OF HEAD/NECK SOFT TISSUES TECHNIQUE: Ultrasound examination of the head and neck soft tissues was performed in the area of clinical concern. COMPARISON:  None. FINDINGS: Sonographic images of the area of concern in patient's left breast 10 o'clock, retroareolar location, demonstrate diffuse skin and breast edema with skin thickening of up to 5 mm. No suspicious masses or drainable fluid collections are seen on the presented images. IMPRESSION: Diffuse subareolar left breast edema, consistent with the given history cellulitis/mastitis. No breast abscess is seen on the presented images. Please note that the complete depth of the breast tissue is not seen on this exam. Electronically Signed   By: Ted Mcalpine M.D.   On: 02/12/2020 16:54        Scheduled Meds: . Chlorhexidine Gluconate Cloth  6 each Topical Daily  . enoxaparin (LOVENOX) injection  40 mg Subcutaneous Q24H  .  furosemide  40 mg Intravenous Daily  . metoprolol tartrate  25 mg Oral BID  . metroNIDAZOLE  500 mg Oral Q8H  . oxyCODONE  10 mg Oral Q12H  . polyethylene glycol  17 g Oral BID  . senna  1 tablet Oral QHS   Continuous Infusions:    LOS: 13 days   Time spent: Greater than 50% of this time was spent in counseling, explanation of diagnosis, planning of further management, and coordination of care.  I have personally reviewed and interpreted on  02/13/2020 daily labs,I reviewed all nursing notes, pharmacy notes, consultant notes,  vitals, pertinent old records  I have discussed plan of care as described above with RN , patient on 02/13/2020  Voice Recognition /Dragon dictation system was used to create this note, attempts have been made to correct errors. Please contact the author with questions and/or clarifications.   Albertine Grates, MD PhD FACP [facility]  Available via Epic secure chat 7am-7pm for nonurgent issues Please page for urgent issues To page the attending provider between 7A-7P or the covering provider during after hours 7P-7A, please log into the web site [email or web address] and access using universal [place] password for that web site. If you do not have the password, please call the hospital operator.    02/13/2020, 8:46 AM

## 2020-02-14 DIAGNOSIS — M6282 Rhabdomyolysis: Secondary | ICD-10-CM | POA: Diagnosis not present

## 2020-02-14 DIAGNOSIS — D649 Anemia, unspecified: Secondary | ICD-10-CM | POA: Diagnosis not present

## 2020-02-14 DIAGNOSIS — E8770 Fluid overload, unspecified: Secondary | ICD-10-CM | POA: Diagnosis not present

## 2020-02-14 DIAGNOSIS — R748 Abnormal levels of other serum enzymes: Secondary | ICD-10-CM | POA: Diagnosis not present

## 2020-02-14 LAB — CBC WITH DIFFERENTIAL/PLATELET
Abs Immature Granulocytes: 0.3 10*3/uL — ABNORMAL HIGH (ref 0.00–0.07)
Basophils Absolute: 0.1 10*3/uL (ref 0.0–0.1)
Basophils Relative: 1 %
Eosinophils Absolute: 0.3 10*3/uL (ref 0.0–0.5)
Eosinophils Relative: 3 %
HCT: 29.7 % — ABNORMAL LOW (ref 36.0–46.0)
Hemoglobin: 9.4 g/dL — ABNORMAL LOW (ref 12.0–15.0)
Immature Granulocytes: 3 %
Lymphocytes Relative: 17 %
Lymphs Abs: 1.7 10*3/uL (ref 0.7–4.0)
MCH: 29 pg (ref 26.0–34.0)
MCHC: 31.6 g/dL (ref 30.0–36.0)
MCV: 91.7 fL (ref 80.0–100.0)
Monocytes Absolute: 1.5 10*3/uL — ABNORMAL HIGH (ref 0.1–1.0)
Monocytes Relative: 15 %
Neutro Abs: 6.2 10*3/uL (ref 1.7–7.7)
Neutrophils Relative %: 61 %
Platelets: 373 10*3/uL (ref 150–400)
RBC: 3.24 MIL/uL — ABNORMAL LOW (ref 3.87–5.11)
RDW: 16.4 % — ABNORMAL HIGH (ref 11.5–15.5)
WBC: 10 10*3/uL (ref 4.0–10.5)
nRBC: 0 % (ref 0.0–0.2)

## 2020-02-14 LAB — COMPREHENSIVE METABOLIC PANEL
ALT: 29 U/L (ref 0–44)
AST: 51 U/L — ABNORMAL HIGH (ref 15–41)
Albumin: 2.9 g/dL — ABNORMAL LOW (ref 3.5–5.0)
Alkaline Phosphatase: 26 U/L — ABNORMAL LOW (ref 38–126)
Anion gap: 12 (ref 5–15)
BUN: 8 mg/dL (ref 6–20)
CO2: 24 mmol/L (ref 22–32)
Calcium: 8.9 mg/dL (ref 8.9–10.3)
Chloride: 104 mmol/L (ref 98–111)
Creatinine, Ser: 0.56 mg/dL (ref 0.44–1.00)
GFR, Estimated: >60 mL/min (ref >60)
Glucose, Bld: 92 mg/dL (ref 70–99)
Potassium: 3.5 mmol/L (ref 3.5–5.1)
Sodium: 140 mmol/L (ref 135–145)
Total Bilirubin: 0.6 mg/dL (ref 0.3–1.2)
Total Protein: 6 g/dL — ABNORMAL LOW (ref 6.5–8.1)

## 2020-02-14 NOTE — Evaluation (Signed)
Occupational Therapy Evaluation Patient Details Name: Haley Sandoval MRN: [id number] DOB: 2000/09/21 Today's Date: 02/14/2020    History of Present Illness 19 year old female with past medical history of rhabdomyolysis, pelvic inflammatory disease admitted with sepsis 2* pelvic inflammatory disease, R buttock abscess.   Clinical Impression   Pt admitted with the above. Pt currently with functional limitations due to the deficits listed below (see OT Problem List).  Pt will benefit from skilled OT to increase their safety and independence with ADL and functional mobility for ADL to facilitate discharge to venue listed below.      Follow Up Recommendations  Supervision - Intermittent;Home health OT    Equipment Recommendations  None recommended by OT    Recommendations for Other Services       Precautions / Restrictions Precautions Precautions: Fall      Mobility Bed Mobility               General bed mobility comments: up in recliner    Transfers Overall transfer level: Modified independent                        ADL either performed or assessed with clinical judgement   ADL Overall ADL's : Needs assistance/impaired Eating/Feeding: Modified independent   Grooming: Modified independent;Standing   Upper Body Bathing: Modified independent;Sitting   Lower Body Bathing: Minimal assistance;Sit to/from stand;Cueing for safety;Cueing for sequencing   Upper Body Dressing : Modified independent;Sitting   Lower Body Dressing: Minimal assistance;Sit to/from stand;Cueing for safety;Cueing for sequencing   Toilet Transfer: Modified Independent   Toileting- Clothing Manipulation and Hygiene: Modified independent         General ADL Comments: pt may need sock aid for LB dressing     Vision Patient Visual Report: No change from baseline              Pertinent Vitals/Pain Pain Score: 3  Pain Location: generalized Pain Descriptors / Indicators:  Aching;Sore Pain Intervention(s): Monitored during session     Hand Dominance     Extremity/Trunk Assessment         Cervical / Trunk Assessment Cervical / Trunk Assessment: Normal   Communication Communication Communication: No difficulties   Cognition Arousal/Alertness: Awake/alert Behavior During Therapy: WFL for tasks assessed/performed Overall Cognitive Status: Within Functional Limits for tasks assessed                                        Exercises Shoulder Exercises Shoulder Flexion: AAROM;Both;10 reps;Seated Elbow Flexion: AROM;Both;Theraband;5 reps Theraband Level (Elbow Flexion): Level 2 (Red)   Shoulder Instructions      Home Living Family/patient expects to be discharged to:: Private residence Living Arrangements: Parent Available Help at Discharge: Family;Available PRN/intermittently Type of Home: House Home Access: Stairs to enter [employer] of Steps: 3 Entrance Stairs-Rails: Right Home Layout: One level     Bathroom Shower/Tub: Chief Strategy Officer: Standard Bathroom Accessibility: Yes   Home Equipment: None   Additional Comments: lives with mother who works      Prior Functioning/Environment Level of Independence: Independent        Comments: Tourist information centre manager, drives        OT Problem List: Decreased activity tolerance;Decreased strength      OT Treatment/Interventions: Self-care/ADL training;Patient/family education;DME and/or AE instruction    OT Goals(Current goals can be found in the care plan  section) Acute Rehab OT Goals Patient Stated Goal: return home with family to assist OT Goal Formulation: With patient Time For Goal Achievement: 02/26/20  OT Frequency: Min 2X/week   Barriers to D/C:               AM-PAC OT "6 Clicks" Daily Activity     Outcome Measure Help from another person eating meals?: None Help from another person taking care of personal grooming?:  None Help from another person toileting, which includes using toliet, bedpan, or urinal?: A Little Help from another person bathing (including washing, rinsing, drying)?: A Little Help from another person to put on and taking off regular upper body clothing?: None Help from another person to put on and taking off regular lower body clothing?: A Little 6 Click Score: 21   End of Session Nurse Communication: Mobility status  Activity Tolerance: Patient tolerated treatment well Patient left: in chair  OT Visit Diagnosis: Unsteadiness on feet (R26.81);Muscle weakness (generalized) (M62.81)                Time: 2878-6767 OT Time Calculation (min): 28 min Charges:  OT General Charges $OT Visit: 1 Visit OT Evaluation $OT Eval Moderate Complexity: 1 Mod OT Treatments $Self Care/Home Management : 8-22 mins  Lise Auer, OT Acute Rehabilitation Services [phone] Office- [phone]     Klarisa Barman, Karin Golden D 02/14/2020, 1:09 PM

## 2020-02-14 NOTE — Progress Notes (Signed)
Occupational Therapy Treatment Patient Details Name: Haley Sandoval MRN: [id number] DOB: [birth year] Today's Date: 02/14/2020    History of present illness 19 year old female with past medical history of rhabdomyolysis, pelvic inflammatory disease admitted with sepsis 2* pelvic inflammatory disease, R buttock abscess.   OT comments  MD approved pt to go outside with OT!   Follow Up Recommendations  Supervision - Intermittent;Home health OT    Equipment Recommendations  None recommended by OT    Recommendations for Other Services      Precautions / Restrictions Precautions Precautions: Fall       Mobility Bed Mobility Overal bed mobility: Modified Independent       Supine to sit: Modified independent (Device/Increase time)     General bed mobility comments: up in recliner  Transfers Overall transfer level: Modified independent                        ADL either performed or assessed with clinical judgement   ADL Overall ADL's : Needs assistance/impaired Eating/Feeding: Modified independent   Grooming: Modified independent;Standing   Upper Body Bathing: Modified independent;Sitting   Lower Body Bathing: Minimal assistance;Sit to/from stand;Cueing for safety;Cueing for sequencing   Upper Body Dressing : Modified independent;Sitting   Lower Body Dressing: Minimal assistance;Sit to/from stand;Cueing for safety;Cueing for sequencing   Toilet Transfer: Modified Independent   Toileting- Clothing Manipulation and Hygiene: Modified independent         General ADL Comments: MD gave pt permission to go outside.  OT session focused on energy conservation with fxal mobility and did go outside!  Pt so pleased with being able to get outside and focus on getting stronger.  Pt in much better spirits this day and focused on gettnig strongers     Vision Patient Visual Report: No change from baseline            Cognition Arousal/Alertness:  Awake/alert Behavior During Therapy: WFL for tasks assessed/performed Overall Cognitive Status: Within Functional Limits for tasks assessed                                          Exercises Shoulder Exercises Shoulder Flexion: AAROM;Both;10 reps;Seated Elbow Flexion: AROM;Both;Theraband;5 reps Theraband Level (Elbow Flexion): Level 2 (Red)           Pertinent Vitals/ Pain       Pain Assessment: No/denies pain Pain Score: 3  Pain Location: generalized Pain Descriptors / Indicators: Aching;Sore Pain Intervention(s): Monitored during session  Home Living Family/patient expects to be discharged to:: Private residence Living Arrangements: Parent Available Help at Discharge: Family;Available PRN/intermittently Type of Home: House Home Access: Stairs to enter [employer] of Steps: 3 Entrance Stairs-Rails: Right Home Layout: One level     Bathroom Shower/Tub: Chief Strategy Officer: Standard Bathroom Accessibility: Yes   Home Equipment: None   Additional Comments: lives with mother who works      Prior Functioning/Environment Level of Independence: Independent        Comments: Tourist information centre manager, drives   Frequency  Min 2X/week        Progress Toward Goals  OT Goals(current goals can now be found in the care plan section)  Progress towards OT goals: Progressing toward goals  Acute Rehab OT Goals Patient Stated Goal: return home with family to assist OT Goal Formulation: With patient Time For Goal Achievement: 02/26/20  ADL Goals Pt Will Perform Lower Body Bathing: with modified independence;sit to/from stand Pt Will Perform Lower Body Dressing: with modified independence;sit to/from stand;with adaptive equipment Pt Will Perform Tub/Shower Transfer: with modified independence Pt/caregiver will Perform Home Exercise Program: Both right and left upper extremity;With written HEP provided;With theraband  Plan Discharge  plan remains appropriate    Co-evaluation                 AM-PAC OT "6 Clicks" Daily Activity     Outcome Measure   Help from another person eating meals?: None Help from another person taking care of personal grooming?: None Help from another person toileting, which includes using toliet, bedpan, or urinal?: A Little Help from another person bathing (including washing, rinsing, drying)?: A Little Help from another person to put on and taking off regular upper body clothing?: None Help from another person to put on and taking off regular lower body clothing?: A Little 6 Click Score: 21    End of Session    OT Visit Diagnosis: Unsteadiness on feet (R26.81);Muscle weakness (generalized) (M62.81)   Activity Tolerance Patient tolerated treatment well   Patient Left in chair   Nurse Communication Mobility status        Time: 1340-1400 OT Time Calculation (min): 20 min  Charges: OT General Charges $OT Visit: 1 Visit OT Evaluation $OT Eval Moderate Complexity: 1 Mod OT Treatments $Self Care/Home Management : 8-22 mins  Lise Auer, OT Acute Rehabilitation Services [phone] Office- [phone]      Brindle Leyba, Karin Golden D 02/14/2020, 2:15 PM

## 2020-02-14 NOTE — Plan of Care (Signed)
  Problem: Clinical Measurements: Goal: Respiratory complications will improve Outcome: Progressing   Problem: Clinical Measurements: Goal: Cardiovascular complication will be avoided Outcome: Progressing   Problem: Pain Managment: Goal: General experience of comfort will improve Outcome: Progressing   Problem: Skin Integrity: Goal: Risk for impaired skin integrity will decrease Outcome: Progressing   

## 2020-02-14 NOTE — Progress Notes (Signed)
PROGRESS NOTE    Haley Sandoval  YPP:[id number] DOB: 2000/03/02 DOA: 01/30/2020 PCP: Barbette Merino, NP    Chief Complaint  Patient presents with  . Abscess    Brief Narrative:  19 year old female  had recent patient at [facility] for abdomen pain, and myositis Initial CK was elevated at 31,000.  CT was concerning for PID.  She was empirically started on ceftriaxone, doxycycline and metronidazole.  Patient was also started on steroids for rhabdomyolysis.  she was discharged home on 12/2 She came back to the ED on 12/11 and  was found to have small abscess on right buttock which was drained by the ER physician and started on empiric antibiotics.  Also complained of pain in both thighs and arms, concern for myositis  Subjective:  Still significant volume overloaded, not improving, less pain, less weakness Urinating a lot after lasix, 2.7 L last 24 hours, still +12 Ls ( was + 18L at one point) less left breast pain, no fever She sleeps on her left side due to PICC line on the right side  Assessment & Plan:   Principal Problem:   Sepsis (HCC) Active Problems:   Rhabdomyolysis   Elevated liver enzymes   Obesity, Class III, BMI 40-49.9 (morbid obesity) (HCC)   Cellulitis   Morbidly obese (HCC)  Sepsis present on admission -On admission meets criteria for sepsis HR> 90,> 12 K, lactic acid> 2  -Source of infection abscess of right buttock ( drained  In the ER), and possible PID -She received vancomycin and cefepime from December 11 to December 13,  Then Rocephin from December 13 to December 21, Zithromax from December 13 to December 19,  Flagyl from 12/13 to 12/24,  Keflex from 12/21 to December 23 -She has no fever, leukocytosis normalized  Volume overload/generalized edema ivf  d/ced on 12/22, started iv lasix on 12/22,  encourage activity, start physical therapy  repeat bmp q48hrs  Rhabdomyolysis -Possible myositis, negative ana, negative anti ssa/anti ssb, initial  planned muscle biopsy canceled after Dr. Sharl Ma discussion with rheumatology Dr. Dimple Casey who recommend  To hold off muscle biopsy as her CK has significantly improved without taking prednisone -Patient will follow up with Dr. Dimple Casey -CK trending down  Transaminitis likely from rhabdomyolysis Acute hepatitis panel negative LFT trending down Already have GI follow-up appointment  Hypertension/sinus tachycardia Continue beta-blocker Started Lasix as signs of significant  volume overload  Normocytic anemia No sign of blood loss, stool is brown, FOBT negative  Possibly from volume overload, start IV Lasix Monitor CBC  Hypomagnesemia, replaced, monitor  Repeat CT pelvis shows persistent pelvic inflammatory disease with some improvement.  chlamydia and gonorrhea probe negative transvaginal ultrasound unremarkable She finished treatment with rocephin and flagyl OB/GYN Dr. Vergie Living has signed off  Class III obesity Body mass index is 49.42 kg/m. Will benefit from outpatient weight loss program  Left-sided breast pain, left breast appears bigger than the right side No erythema on exam, does has mild tenderness breast ultrasound no fluids collection,likely  from edema, Monitor off abx  DVT prophylaxis: SCD's Start: 02/09/20 0000 enoxaparin (LOVENOX) injection 40 mg Start: 02/08/20 1800 SCDs Start: 01/31/20 0148   Code Status:full Family Communication: Patient, declined my offer to call her family, states she kept her mother updated over the phone Disposition:   Status is: Inpatient  Dispo: The patient is from: Home              Anticipated d/c is to: Home  Anticipated d/c date is: possible 48hrs, currently significant volume overload                Consultants:   General surgery  Rheumatology  OB/GYN  Procedures:   Transvaginal ultrasound  Antimicrobials:    vancomycin and cefepime from December 11 to December 13,  Then Rocephin from December 13 to December  21, Zithromax from December 13 to December 19,  Flagyl from 12/13 to 12/24,  Keflex from 12/21 to December 23     Objective: Vitals:   02/13/20 0523 02/13/20 1347 02/13/20 2054 02/14/20 0543  BP: (!) 141/61 (!) 151/99 (!) 137/56 135/75  Pulse: (!) 108 (!) 104 (!) 109 (!) 102  Resp: 16 18 18 18   Temp: 99.1 F (37.3 C) 98.7 F (37.1 C) 98.9 F (37.2 C) 98.4 F (36.9 C)  TempSrc: Oral     SpO2: 100% 100% 100% 100%  Weight:      Height:        Intake/Output Summary (Last 24 hours) at 02/14/2020 0905 Last data filed at 02/14/2020 0741 Gross per 24 hour  Intake 1320 ml  Output 2700 ml  Net -1380 ml   Filed Weights   01/30/20 2144  Weight: 134.7 kg    Examination:  General exam: calm, NAD Respiratory system: Diminished at bases , no rales , no wheezing  Respiratory effort normal. Cardiovascular system: less Tachycardia  Gastrointestinal system: Abdomen is nondistended, soft and nontender. Normal bowel sounds heard. Central nervous system: Alert and oriented. No focal neurological deficits. Extremities: Symmetric 5 x 5 power.  Generalized edema Skin: No rashes, lesions or ulcers Psychiatry: Judgement and insight appear normal. Mood & affect appropriate.     Data Reviewed: I have personally reviewed following labs and imaging studies  CBC: Recent Labs  Lab 02/08/20 0343 02/12/20 0330 02/14/20 0449  WBC 10.9* 11.7* 10.0  NEUTROABS  --  7.6 6.2  HGB 10.0* 9.8* 9.4*  HCT 31.1* 30.7* 29.7*  MCV 90.1 91.1 91.7  PLT 353 393 373    Basic Metabolic Panel: Recent Labs  Lab 02/08/20 0343 02/09/20 0410 02/10/20 0338 02/11/20 0410 02/12/20 0330 02/13/20 0327 02/14/20 0449  NA 139  --   --   --  140  --  140  K 3.5  --   --   --  3.7  --  3.5  CL 104  --   --   --  104  --  104  CO2 26  --   --   --  25  --  24  GLUCOSE 94  --   --   --  99  --  92  BUN 6  --   --   --  7  --  8  CREATININE 0.42*  --   --   --  0.56  --  0.56  CALCIUM 7.9*  --   --   --  8.7*   --  8.9  MG 1.6* 1.9 1.6* 1.8 1.8 1.9  --   PHOS 4.3 4.6 3.9 4.2  --   --   --     GFR: Estimated Creatinine Clearance: 156 mL/min (by C-G formula based on SCr of 0.56 mg/dL).  Liver Function Tests: Recent Labs  Lab 02/12/20 0330 02/14/20 0449  AST 71* 51*  ALT 33 29  ALKPHOS 28* 26*  BILITOT 0.6 0.6  PROT 5.8* 6.0*  ALBUMIN 2.8* 2.9*    CBG: No results for input(s): GLUCAP in the last  168 hours.   Recent Results (from the past 240 hour(s))  MRSA PCR Screening     Status: None   Collection Time: 02/08/20  3:30 PM   Specimen: Nasal Mucosa; Nasopharyngeal  Result Value Ref Range Status   MRSA by PCR NEGATIVE NEGATIVE Final    Comment:        The GeneXpert MRSA Assay (FDA approved for NASAL specimens only), is one component of a comprehensive MRSA colonization surveillance program. It is not intended to diagnose MRSA infection nor to guide or monitor treatment for MRSA infections. Performed at [facility], [address], [place], [place] [address]          Radiology Studies: [place] CHEST SOFT TISSUE  Result Date: 02/12/2020 CLINICAL DATA:  Left periareolar cellulitis. EXAM: ULTRASOUND OF HEAD/NECK SOFT TISSUES TECHNIQUE: Ultrasound examination of the head and neck soft tissues was performed in the area of clinical concern. COMPARISON:  None. FINDINGS: Sonographic images of the area of concern in patient's left breast 10 o'clock, retroareolar location, demonstrate diffuse skin and breast edema with skin thickening of up to 5 mm. No suspicious masses or drainable fluid collections are seen on the presented images. IMPRESSION: Diffuse subareolar left breast edema, consistent with the given history cellulitis/mastitis. No breast abscess is seen on the presented images. Please note that the complete depth of the breast tissue is not seen on this exam. Electronically Signed   By: Ted Mcalpine M.D.   On: 02/12/2020 16:54        Scheduled  Meds: . Chlorhexidine Gluconate Cloth  6 each Topical Daily  . enoxaparin (LOVENOX) injection  40 mg Subcutaneous Q24H  . furosemide  40 mg Intravenous Daily  . metoprolol tartrate  25 mg Oral BID  . oxyCODONE  10 mg Oral Q12H  . polyethylene glycol  17 g Oral BID  . senna  1 tablet Oral QHS   Continuous Infusions:    LOS: 14 days   Time spent: Greater than 50% of this time was spent in counseling, explanation of diagnosis, planning of further management, and coordination of care.  I have personally reviewed and interpreted on  02/14/2020 daily labs,I reviewed all nursing notes, pharmacy notes, consultant notes,  vitals, pertinent old records  I have discussed plan of care as described above with RN , patient on 02/14/2020  Voice Recognition /Dragon dictation system was used to create this note, attempts have been made to correct errors. Please contact the author with questions and/or clarifications.   Albertine Grates, MD PhD FACP [facility]  Available via Epic secure chat 7am-7pm for nonurgent issues Please page for urgent issues To page the attending provider between 7A-7P or the covering provider during after hours 7P-7A, please log into the web site [email or web address] and access using universal [place] password for that web site. If you do not have the password, please call the hospital operator.    02/14/2020, 9:05 AM

## 2020-02-15 DIAGNOSIS — M6282 Rhabdomyolysis: Secondary | ICD-10-CM | POA: Diagnosis not present

## 2020-02-15 DIAGNOSIS — D649 Anemia, unspecified: Secondary | ICD-10-CM | POA: Diagnosis not present

## 2020-02-15 DIAGNOSIS — E8770 Fluid overload, unspecified: Secondary | ICD-10-CM | POA: Diagnosis not present

## 2020-02-15 DIAGNOSIS — R748 Abnormal levels of other serum enzymes: Secondary | ICD-10-CM | POA: Diagnosis not present

## 2020-02-15 LAB — BASIC METABOLIC PANEL
Anion gap: 10 (ref 5–15)
BUN: 10 mg/dL (ref 6–20)
CO2: 26 mmol/L (ref 22–32)
Calcium: 8.7 mg/dL — ABNORMAL LOW (ref 8.9–10.3)
Chloride: 104 mmol/L (ref 98–111)
Creatinine, Ser: 0.64 mg/dL (ref 0.44–1.00)
GFR, Estimated: >60 mL/min (ref >60)
Glucose, Bld: 105 mg/dL — ABNORMAL HIGH (ref 70–99)
Potassium: 3.5 mmol/L (ref 3.5–5.1)
Sodium: 140 mmol/L (ref 135–145)

## 2020-02-15 LAB — CBC
HCT: 29.1 % — ABNORMAL LOW (ref 36.0–46.0)
Hemoglobin: 9.3 g/dL — ABNORMAL LOW (ref 12.0–15.0)
MCH: 29.7 pg (ref 26.0–34.0)
MCHC: 32 g/dL (ref 30.0–36.0)
MCV: 93 fL (ref 80.0–100.0)
Platelets: 373 10*3/uL (ref 150–400)
RBC: 3.13 MIL/uL — ABNORMAL LOW (ref 3.87–5.11)
RDW: 16.6 % — ABNORMAL HIGH (ref 11.5–15.5)
WBC: 8.7 10*3/uL (ref 4.0–10.5)
nRBC: 0 % (ref 0.0–0.2)

## 2020-02-15 LAB — MAGNESIUM: Magnesium: 1.7 mg/dL (ref 1.7–2.4)

## 2020-02-15 LAB — CK: Total CK: 745 U/L — ABNORMAL HIGH (ref 38–234)

## 2020-02-15 MED ORDER — FUROSEMIDE 10 MG/ML IJ SOLN: 40.0000 mg | Freq: Two times a day (BID) | INTRAMUSCULAR | Status: DC

## 2020-02-15 MED ORDER — MAGNESIUM OXIDE 400 (241.3 MG) MG PO TABS
400.0000 mg | ORAL_TABLET | Freq: Every day | ORAL | Status: DC
  Administered 2020-02-15 – 2020-02-16 (×2): 400 mg via ORAL
  Filled 2020-02-15 (×2): qty 1

## 2020-02-15 MED ORDER — FUROSEMIDE 10 MG/ML IJ SOLN
40.0000 mg | Freq: Two times a day (BID) | INTRAMUSCULAR | Status: DC
  Administered 2020-02-15: 16:00:00 40 mg via INTRAVENOUS
  Filled 2020-02-15: qty 4

## 2020-02-15 MED ORDER — POTASSIUM CHLORIDE CRYS ER 20 MEQ PO TBCR
40.0000 meq | EXTENDED_RELEASE_TABLET | Freq: Once | ORAL | Status: AC
  Administered 2020-02-15: 10:00:00 40 meq via ORAL
  Filled 2020-02-15: qty 2

## 2020-02-15 NOTE — Progress Notes (Addendum)
PROGRESS NOTE    Haley Sandoval  TGG:[id number] DOB: [birth year] DOA: 01/30/2020 PCP: Barbette Merino, NP    Chief Complaint  Patient presents with  . Abscess    Brief Narrative:  19 year old female  had [facility] at [facility] for abdomen pain, and myositis Initial CK was elevated at 31,000.  CT was concerning for PID.  She was empirically started on ceftriaxone, doxycycline and metronidazole.  Patient was also started on steroids for rhabdomyolysis.  she was discharged home on 12/2 She came back to the ED on 12/11 and  was found to have small abscess on right buttock which was drained by the ER physician and started on empiric antibiotics.  Also complained of pain in both thighs and arms, concern for myositis  Subjective:  Still significant volume overloaded, start to improve, less pain, less weakness Urinating a lot after lasix, 1.6L last 24 hours, still +12 Ls ( was + 18L prior to starting lasix) less left breast pain, no fever She sleeps on her left side due to PICC line on the right side  Assessment & Plan:   Principal Problem:   Sepsis (HCC) Active Problems:   Rhabdomyolysis   Elevated liver enzymes   Obesity, Class III, BMI 40-49.9 (morbid obesity) (HCC)   Cellulitis   Morbidly obese (HCC)  Sepsis present on admission -On admission meets criteria for sepsis HR> 90,> 12 K, lactic acid> 2  -Source of infection abscess of right buttock ( drained  In the ER), and possible PID -She received vancomycin and cefepime from December 11 to December 13,  Then Rocephin from December 13 to December 21, Zithromax from December 13 to December 19,  Flagyl from 12/13 to 12/24,  Keflex from 12/21 to December 23 -She has no fever, leukocytosis normalized -wbc normalized, left buttock wound improving, will have wound care evaluate, may need home health RN for wound care at discharge  Volume overload/generalized edema ivf  d/ced on 12/22, started iv lasix on 12/22, increase lasix  to bid on 12/26 due to significant edema encourage activity, start physical therapy  repeat bmp q48hrs  Left-sided breast pain, left breast appears bigger than the right side No erythema on exam, does has mild tenderness breast ultrasound no fluids collection,likely  from edema,  Improving on lasix, wbc normalized, Monitor off abx   Hypokalemia/hypomagnesemia Replace k/mag, recheck in am  Rhabdomyolysis -Possible myositis, negative ana, negative anti ssa/anti ssb, initial planned muscle biopsy canceled after Dr. Sharl Ma discussion with rheumatology Dr. Dimple Casey who recommend  To hold off muscle biopsy as her CK has significantly improved without taking prednisone -Patient will follow up with Dr. Dimple Casey -CK trending down  Transaminitis likely from rhabdomyolysis Acute hepatitis panel negative LFT trending down Already have GI follow-up appointment  Hypertension/sinus tachycardia Continue beta-blocker Started Lasix as signs of significant  volume overload  Normocytic anemia No sign of blood loss, stool is brown, FOBT negative  Possibly from volume overload and frequent blood draw, start IV Lasix Monitor CBC  Repeat CT pelvis shows persistent pelvic inflammatory disease with some improvement.  chlamydia and gonorrhea probe negative transvaginal ultrasound unremarkable She finished treatment with rocephin and flagyl OB/GYN Dr. Vergie Living has signed off  Class III obesity Body mass index is 49.42 kg/m. Will benefit from outpatient weight loss program   DVT prophylaxis: SCD's Start: 02/09/20 0000 enoxaparin (LOVENOX) injection 40 mg Start: 02/08/20 1800 SCDs Start: 01/31/20 0148   Code Status:full Family Communication: Patient, declined my offer to call her  family, states she kept her mother updated over the phone Disposition:   Status is: Inpatient  Dispo: The patient is from: Home              Anticipated d/c is to: Home              Anticipated d/c date is: possible 57-  48hrs, currently significant volume overload, will need home health OT and likely RN for right buttock wound packing                Consultants:   General surgery  Rheumatology  OB/GYN  Procedures:   Transvaginal ultrasound  Antimicrobials:    vancomycin and cefepime from December 11 to December 13,  Then Rocephin from December 13 to December 21, Zithromax from December 13 to December 19,  Flagyl from 12/13 to 12/24,  Keflex from 12/21 to December 23     Objective: Vitals:   02/14/20 0543 02/14/20 1334 02/14/20 2100 02/15/20 0520  BP: 135/75 (!) 150/93 (!) 155/76 (!) 138/57  Pulse: (!) 102 96 (!) 106 (!) 102  Resp: 18 18 17 16   Temp: 98.4 F (36.9 C)  99.8 F (37.7 C) 98.9 F (37.2 C)  TempSrc:   Oral Oral  SpO2: 100% 100% 100% 100%  Weight:      Height:        Intake/Output Summary (Last 24 hours) at 02/15/2020 1114 Last data filed at 02/15/2020 02/17/2020 Gross per 24 hour  Intake 1560 ml  Output 700 ml  Net 860 ml   Filed Weights   01/30/20 2144  Weight: 134.7 kg    Examination:  General exam: calm, NAD Respiratory system: Diminished at bases , no rales , no wheezing  Respiratory effort normal. Cardiovascular system: less Tachycardia  Gastrointestinal system: Abdomen is nondistended, soft and nontender. Normal bowel sounds heard. Central nervous system: Alert and oriented. No focal neurological deficits. Extremities: Symmetric 5 x 5 power.  Generalized edema Skin: left buttock wound healing, does not seem infected Psychiatry: Judgement and insight appear normal. Mood & affect appropriate.       Data Reviewed: I have personally reviewed following labs and imaging studies  CBC: Recent Labs  Lab 02/12/20 0330 02/14/20 0449 02/15/20 0436  WBC 11.7* 10.0 8.7  NEUTROABS 7.6 6.2  --   HGB 9.8* 9.4* 9.3*  HCT 30.7* 29.7* 29.1*  MCV 91.1 91.7 93.0  PLT 393 373 373    Basic Metabolic Panel: Recent Labs  Lab 02/09/20 0410 02/10/20 0338  02/11/20 0410 02/12/20 0330 02/13/20 0327 02/14/20 0449 02/15/20 0436  NA  --   --   --  140  --  140 140  K  --   --   --  3.7  --  3.5 3.5  CL  --   --   --  104  --  104 104  CO2  --   --   --  25  --  24 26  GLUCOSE  --   --   --  99  --  92 105*  BUN  --   --   --  7  --  8 10  CREATININE  --   --   --  0.56  --  0.56 0.64  CALCIUM  --   --   --  8.7*  --  8.9 8.7*  MG 1.9 1.6* 1.8 1.8 1.9  --  1.7  PHOS 4.6 3.9 4.2  --   --   --   --  GFR: Estimated Creatinine Clearance: 156 mL/min (by C-G formula based on SCr of 0.64 mg/dL).  Liver Function Tests: Recent Labs  Lab 02/12/20 0330 02/14/20 0449  AST 71* 51*  ALT 33 29  ALKPHOS 28* 26*  BILITOT 0.6 0.6  PROT 5.8* 6.0*  ALBUMIN 2.8* 2.9*    CBG: No results for input(s): GLUCAP in the last 168 hours.   Recent Results (from the past 240 hour(s))  MRSA PCR Screening     Status: None   Collection Time: 02/08/20  3:30 PM   Specimen: Nasal Mucosa; Nasopharyngeal  Result Value Ref Range Status   MRSA by PCR NEGATIVE NEGATIVE Final    Comment:        The GeneXpert MRSA Assay (FDA approved for NASAL specimens only), is one component of a comprehensive MRSA colonization surveillance program. It is not intended to diagnose MRSA infection nor to guide or monitor treatment for MRSA infections. Performed at [facility], [address], [place], [place] [address]          Radiology Studies: No results found.      Scheduled Meds: . Chlorhexidine Gluconate Cloth  6 each Topical Daily  . enoxaparin (LOVENOX) injection  40 mg Subcutaneous Q24H  . magnesium oxide  400 mg Oral Daily  . metoprolol tartrate  25 mg Oral BID  . oxyCODONE  10 mg Oral Q12H  . polyethylene glycol  17 g Oral BID  . senna  1 tablet Oral QHS   Continuous Infusions:    LOS: 15 days   Time spent: Greater than 50% of this time was spent in counseling, explanation of diagnosis, planning of further  management, and coordination of care.  I have personally reviewed and interpreted on  02/15/2020 daily labs,I reviewed all nursing notes, pharmacy notes, consultant notes,  vitals, pertinent old records  I have discussed plan of care as described above with RN , patient on 02/15/2020  Voice Recognition /Dragon dictation system was used to create this note, attempts have been made to correct errors. Please contact the author with questions and/or clarifications.   Albertine Grates, MD PhD FACP [facility]  Available via Epic secure chat 7am-7pm for nonurgent issues Please page for urgent issues To page the attending provider between 7A-7P or the covering provider during after hours 7P-7A, please log into the web site [email or web address] and access using universal [place] password for that web site. If you do not have the password, please call the hospital operator.    02/15/2020, 11:14 AM

## 2020-02-15 NOTE — Plan of Care (Signed)
  Problem: Clinical Measurements: Goal: Diagnostic test results will improve Outcome: Progressing   Problem: Clinical Measurements: Goal: Respiratory complications will improve Outcome: Progressing   Problem: Clinical Measurements: Goal: Cardiovascular complication will be avoided Outcome: Progressing   Problem: Pain Managment: Goal: General experience of comfort will improve Outcome: Progressing   

## 2020-02-15 NOTE — Progress Notes (Signed)
Ted hose applied and dressing changed. Md in room saw pt wound and wound care consult ordered.

## 2020-02-16 ENCOUNTER — Ambulatory Visit: Payer: Medicaid Other

## 2020-02-16 DIAGNOSIS — A419 Sepsis, unspecified organism: Secondary | ICD-10-CM | POA: Diagnosis not present

## 2020-02-16 LAB — BASIC METABOLIC PANEL
Anion gap: 9 (ref 5–15)
BUN: 8 mg/dL (ref 6–20)
CO2: 28 mmol/L (ref 22–32)
Calcium: 9 mg/dL (ref 8.9–10.3)
Chloride: 103 mmol/L (ref 98–111)
Creatinine, Ser: 0.61 mg/dL (ref 0.44–1.00)
GFR, Estimated: >60 mL/min (ref >60)
Glucose, Bld: 102 mg/dL — ABNORMAL HIGH (ref 70–99)
Potassium: 3.6 mmol/L (ref 3.5–5.1)
Sodium: 140 mmol/L (ref 135–145)

## 2020-02-16 LAB — CK: Total CK: 532 U/L — ABNORMAL HIGH (ref 38–234)

## 2020-02-16 LAB — MAGNESIUM: Magnesium: 1.8 mg/dL (ref 1.7–2.4)

## 2020-02-16 MED ORDER — FUROSEMIDE 20 MG PO TABS: 20.0000 mg | ORAL_TABLET | Freq: Every day | ORAL | 0 refills | Status: DC

## 2020-02-16 MED ORDER — MAGNESIUM OXIDE 400 (241.3 MG) MG PO TABS: 400.0000 mg | ORAL_TABLET | Freq: Every day | ORAL | 0 refills | Status: DC

## 2020-02-16 MED ORDER — OXYCODONE HCL 5 MG PO TABS: 5.0000 mg | ORAL_TABLET | ORAL | 0 refills | Status: DC | PRN

## 2020-02-16 MED ORDER — POLYETHYLENE GLYCOL 3350 17 G PO PACK: 17.0000 g | PACK | Freq: Every day | ORAL | 0 refills | Status: DC | PRN

## 2020-02-16 NOTE — Progress Notes (Signed)
Patient discharged to home w/ family. Given all belongings, instructions. Verbalized understanding. Escorted to pov via w/c. ?

## 2020-02-16 NOTE — Discharge Summary (Signed)
Physician Discharge Summary  Haley Sandoval ERX:[id number] DOB: [birth year] DOA: 01/30/2020  PCP: Barbette Merino, NP  Admit date: 01/30/2020 Discharge date: 02/16/2020  Admitted From: Home Disposition:  Home  Discharge Condition:Stable CODE STATUS:FULL Diet recommendation: Regular  Brief/Interim Summary:  19 year old female  [facility] for abdomen pain, and myositisInitial CK was elevated at 31,000. CT was concerning for PID. She was empirically started on ceftriaxone, doxycycline and metronidazole. Patient was also started on steroids for rhabdomyolysis. she was discharged home on 12/2 but she came back to the ED on 12/11 and  was found to have small abscess on right buttock which was drained by the ER physician and started on empiric antibiotics. Also complained of pain in both thighs and arms, concern for myositis.  Hospital course remarkable for volume overload, started on IV diuresis.  Her edema has significantly improved.  She was seen by PT/OT and recommended home health.  She is medically stable for discharge home today.  Following problems were addressed during her hospitalization:  Sepsis present on admission -On admission meets criteria for sepsis HR> 90,> 12 K, lactic acid> 2  -Source of infection abscess of right buttock ( drained  In the ER), and possible PID -She received vancomycin and cefepime from December 11 to December 13,  Then Rocephin from December 13 to December 21, Zithromax from December 13 to December 19,  Flagyl from 12/13 to 12/24,  Keflex from 12/21 to December 23 -She has no fever, leukocytosis normalized -wbc normalized, left buttock wound improving,she will benefit  With  home health RN for wound care at discharge  Volume overload/generalized edema -ivf  d/ced on 12/22, started iv lasix on 12/22, increase lasix to bid on 12/26 due to significant edema. Take PO  Lasix for few more days. 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 Sharl Ma discussion with rheumatology Dr. Dimple Casey who recommend  To hold off muscle biopsy as her CK has significantly improved without taking prednisone -Patient will follow up with Dr. Dimple Casey -CK trending down  Transaminitis likely from rhabdomyolysis Acute hepatitis panel negative LFT trending down Already have GI follow-up appointment  Hypertension/sinus tachycardia Currently stable Started Lasix as signs of significant  volume overload  Normocytic anemia No sign of blood loss, stool is brown, FOBT negative  Possibly from volume overload Check CBC in a week  PID Repeat CT pelvis shows persistent pelvic inflammatory disease with some improvement.  chlamydia and gonorrhea probe negative transvaginal ultrasound unremarkable She finished treatment with rocephin and flagyl OB/GYN Dr. Vergie Living has signed off  Class III obesity Body mass index is 49.42 kg/m. Will benefit from outpatient weight loss program   Discharge Diagnoses:  Principal Problem:   Sepsis (HCC) Active Problems:   Rhabdomyolysis   Elevated liver enzymes   Obesity, Class III, BMI 40-49.9 (morbid obesity) (HCC)   Cellulitis   Morbidly obese [facility])    Discharge Instructions  Discharge Instructions    Diet general   Complete by: As directed    Discharge instructions   Complete by: As directed    1)Please take prescribed medications as instructed 2)Follow up with PCP and  rheumatology as an outpatient.  Name and number providers have been attached. 3)Follow up with home health   Discharge wound care:    Complete by: As directed    As per home health RN   Increase activity slowly   Complete by: As directed      Allergies as of 02/16/2020   No Known Allergies     Medication List    STOP taking these medications   predniSONE 10 MG tablet Commonly known as: DELTASONE     TAKE these medications   furosemide 20 MG tablet Commonly known as: Lasix Take 1 tablet (20 mg total) by mouth daily for 7 days.   magnesium oxide 400 (241.3 Mg) MG tablet Commonly known as: MAG-OX Take 1 tablet (400 mg total) by mouth daily.   medroxyPROGESTERone 150 MG/ML injection Commonly known as: DEPO-PROVERA Inject 150 mg into the muscle every 3 (three) months.   methocarbamol 500 MG tablet Commonly known as: Robaxin Take 1 tablet (500 mg total) by mouth every 6 (six) hours as needed for muscle spasms.   oxyCODONE 5 MG immediate release tablet Commonly known as: Oxy IR/ROXICODONE Take 1 tablet (5 mg total) by mouth every 4 (four) hours as needed for moderate pain or severe pain.   polyethylene glycol 17 g packet Commonly known as: MIRALAX / GLYCOLAX Take 17 g by mouth daily as needed.            Durable Medical Equipment  (From admission, onward)         Start     Ordered   02/16/20 0841  For home use only DME 3 n 1  Once        02/16/20 9604           Discharge Care Instructions  (From admission, onward)         Start     Ordered   02/16/20 0000  Discharge wound care:       Comments: As per home health RN   02/16/20 0851          Follow-up Information    Barbette Merino, NP Follow up in 1 week(s).   Specialty: Adult Health Nurse Practitioner Why: Hospital discharge follow-up, repeat CBC/BMP/CK at follow-up pcp to monitor buttock wound to ensure continued healing  Contact information: [address] [place] [address] [phone]        Fuller Plan, MD Follow up in 3 week(s).   Specialty: Rheumatology Why: follow up for myositis  Contact  information: [address] [place] [address] [phone]              No Known Allergies  Consultations:  GYN   Procedures/Studies: CT PELVIS W CONTRAST  Result Date: 01/31/2020 CLINICAL DATA:  Anorectal abscess. EXAM: CT PELVIS WITH CONTRAST TECHNIQUE: Multidetector CT imaging of the pelvis was performed using the standard protocol following the bolus administration of intravenous contrast. CONTRAST:  OMNIPAQUE IOHEXOL 300 MG/ML  SOLN COMPARISON:  January 18, 2020 FINDINGS: Urinary Tract: There is circumferential wall thickening of the of the urinary bladder. Bowel: The visualized bowel is unremarkable. The appendix is unremarkable. Vascular/Lymphatic: There is no acute vascular abnormality detected. There are mildly enlarged bilateral inguinal lymph nodes, right worse than left. Reproductive:  No mass or other significant abnormality Other: There are persistent diffuse inflammatory changes in the pelvis with a small amount of free fluid. This is similar but slightly improved from prior study. Musculoskeletal: There is diffuse intracompartmental edema within  the proximal thighs. There may be some edema involving some of the musculature of the proximal thighs bilaterally. This appearance is near symmetric. There is a defect in the posterior subcutaneous soft tissues overlying the right gluteal muscle. In this region, there is fat induration without evidence for an abscess. There is nonspecific edema along the patient's flanks. IMPRESSION: 1. There is a defect in the posterior subcutaneous soft tissues overlying the right gluteal muscle. In this region, there is fat induration without evidence for an abscess. 2. There are persistent diffuse inflammatory changes in the pelvis with a small amount of free fluid. This is similar but slightly improved from prior study. 3. Circumferential wall thickening of the urinary bladder. Correlation with urinalysis is recommended to  exclude cystitis. 4. Apparent intracompartmental edema involving the proximal thighs bilaterally with possible intramuscular edema bilaterally is nonspecific but can be seen in patients with rhabdomyolysis. Correlation with laboratory studies, specifically CK, is recommended. Electronically Signed   By: Katherine Mantle M.D.   On: 01/31/2020 03:27   MR PELVIS WO CONTRAST  Addendum Date: 02/02/2020   ADDENDUM REPORT: 02/02/2020 07:58 ADDENDUM: The uterus and ovaries are normal. Electronically Signed   By: Rudie Meyer M.D.   On: 02/02/2020 07:58   Result Date: 02/02/2020 CLINICAL DATA:  Severe leg swelling and markedly elevated CK levels. EXAM: MRI pelvis, MRI OF THE RIGHT FEMUR WITHOUT CONTRAST TECHNIQUE: Multiplanar, multisequence MR imaging of the pelvis and right femur was performed. No intravenous contrast was administered. COMPARISON:  CT scan 01/31/2020 FINDINGS: Severe diffuse inflammatory process involving the paraspinal, rectus, pelvic, hip and thigh musculature. All of these muscles are markedly swollen and edematous with diffuse increased T2 signal intensity which is quite symmetric. There is also mild subcutaneous soft tissue swelling/edema most notably in the right buttock area where there is an open wound. No underlying abscess is identified. No obvious findings for pyomyositis without contrast. Diffuse inflammatory changes, edema and fluid involving the intrapelvic structures without obvious abscess. The bony structures are unremarkable. No findings suspicious for septic arthritis or osteomyelitis. IMPRESSION: 1. Severe diffuse myositis. Findings could be due to post infectious myositis, paraneoplastic process, autoimmune disorders and drug reactions. 2. Open wound in the right buttock area. No abscess. 3. No findings for septic arthritis or osteomyelitis. Electronically Signed: By: Rudie Meyer M.D. On: 01/31/2020 12:46   CT ABDOMEN PELVIS W CONTRAST  Result Date: 01/18/2020 CLINICAL  DATA:  Body aches. EXAM: CT ABDOMEN AND PELVIS WITH CONTRAST TECHNIQUE: Multidetector CT imaging of the abdomen and pelvis was performed using the standard protocol following bolus administration of intravenous contrast. CONTRAST:  OMNIPAQUE IOHEXOL 300 MG/ML  SOLN COMPARISON:  None. FINDINGS: Lower chest: No acute abnormality. Hepatobiliary: No focal liver abnormality is seen. No gallstones, gallbladder wall thickening, or biliary dilatation. Pancreas: Unremarkable. No pancreatic ductal dilatation or surrounding inflammatory changes. Spleen: Normal in size without focal abnormality. Adrenals/Urinary Tract: Adrenal glands are unremarkable. Kidneys are normal, without renal calculi, focal lesion, or hydronephrosis. Bladder is unremarkable. Stomach/Bowel: Stomach is within normal limits. Appendix appears normal. No evidence of bowel wall thickening, distention, or inflammatory changes. Vascular/Lymphatic: No significant vascular findings are present. No enlarged abdominal or pelvic lymph nodes. Reproductive: Uterus is normal in appearance. Multiple ill-defined bilateral subcentimeter ovarian cysts are seen. Other: No abdominal wall hernia or abnormality. No abdominopelvic ascites. Mild, bilateral nonspecific mesenteric inflammatory fat stranding is seen along the posterior aspects of the mid to lower abdomen. A mild amount of diffuse pelvic inflammatory fat  stranding is seen. Musculoskeletal: Mild subcutaneous inflammatory fat stranding is seen along the lateral aspect of the gluteal region on the right. No acute or significant osseous findings. IMPRESSION: 1. Mild, nonspecific bilateral posterior abdominal and pelvic inflammatory fat stranding which may represent sequelae associated with mild mesenteritis and/or pelvic inflammatory disease. Electronically Signed   By: Aram Candelahaddeus  Houston M.D.   On: 01/18/2020 20:38   MR [facility] RIGHT [facility] CONTRAST  Addendum Date: 02/02/2020   ADDENDUM REPORT: 02/02/2020 07:58  ADDENDUM: The uterus and ovaries are normal. Electronically Signed   By: Rudie MeyerP.  Gallerani M.D.   On: 02/02/2020 07:58   Result Date: 02/02/2020 CLINICAL DATA:  Severe leg swelling and markedly elevated CK levels. EXAM: MRI pelvis, MRI OF THE RIGHT FEMUR WITHOUT CONTRAST TECHNIQUE: Multiplanar, multisequence MR imaging of the pelvis and right femur was performed. No intravenous contrast was administered. COMPARISON:  CT scan 01/31/2020 FINDINGS: Severe diffuse inflammatory process involving the paraspinal, rectus, pelvic, hip and thigh musculature. All of these muscles are markedly swollen and edematous with diffuse increased T2 signal intensity which is quite symmetric. There is also mild subcutaneous soft tissue swelling/edema most notably in the right buttock area where there is an open wound. No underlying abscess is identified. No obvious findings for pyomyositis without contrast. Diffuse inflammatory changes, edema and fluid involving the intrapelvic structures without obvious abscess. The bony structures are unremarkable. No findings suspicious for septic arthritis or osteomyelitis. IMPRESSION: 1. Severe diffuse myositis. Findings could be due to post infectious myositis, paraneoplastic process, autoimmune disorders and drug reactions. 2. Open wound in the right buttock area. No abscess. 3. No findings for septic arthritis or osteomyelitis. Electronically Signed: By: Rudie MeyerP.  Gallerani M.D. On: 01/31/2020 12:46   DG Chest Portable 1 View  Result Date: 01/18/2020 CLINICAL DATA:  Fever and body aches. EXAM: PORTABLE CHEST 1 VIEW COMPARISON:  Jul 11, 2013 FINDINGS: The heart size and mediastinal contours are within normal limits. Both lungs are clear. The visualized skeletal structures are unremarkable. IMPRESSION: No active disease. Electronically Signed   By: Aram Candelahaddeus  Houston M.D.   On: 01/18/2020 18:21   US PELVIC COMPLETE W TRANSVAGINAL AND TORSION R/O  Result Date: 01/31/2020 CLINICAL DATA:  Abdominal  pain. EXAM: TRANSABDOMINAL AND TRANSVAGINAL ULTRASOUND OF PELVIS TECHNIQUE: Both transabdominal and transvaginal ultrasound examinations of the pelvis were performed. Transabdominal technique was performed for global imaging of the pelvis including uterus, ovaries, adnexal regions, and pelvic cul-de-sac. It was necessary to proceed with endovaginal exam following the transabdominal exam to visualize the endometrium and ovaries. COMPARISON:  CT scan of same day. FINDINGS: Uterus Measurements: 7.8 x 4.1 x 3.5 cm = volume: 58 mL. No fibroids or other mass visualized. Endometrium Thickness: 6 mm which is within normal limits. No focal abnormality visualized. Right ovary Not visualized. Left ovary Not visualized. Other findings Small amount of free fluid is noted which most likely is physiologic. IMPRESSION: Uterus and endometrium are unremarkable. Ovaries are not visualized. No definite adnexal abnormality is noted. Electronically Signed   By: Lupita RaiderJames  Green Jr M.D.   On: 01/31/2020 16:25   US CHEST SOFT TISSUE  Result Date: 02/12/2020 CLINICAL DATA:  Left periareolar cellulitis. EXAM: ULTRASOUND OF HEAD/NECK SOFT TISSUES TECHNIQUE: Ultrasound examination of the head and neck soft tissues was performed in the area of clinical concern. COMPARISON:  None. FINDINGS: Sonographic images of the area of concern in patient's left breast 10 o'clock, retroareolar location, demonstrate diffuse skin and breast edema with skin thickening of up to 5  mm. No suspicious masses or drainable fluid collections are seen on the presented images. IMPRESSION: Diffuse subareolar left breast edema, consistent with the given history cellulitis/mastitis. No breast abscess is seen on the presented images. Please note that the complete depth of the breast tissue is not seen on this exam. Electronically Signed   By: Ted Mcalpine M.D.   On: 02/12/2020 16:54   [place] EKG SITE RITE  Result Date: 02/01/2020 If Site Rite image not attached,  placement could not be confirmed due to current cardiac rhythm.     Subjective:  Patient seen and examined the bedside this morning.  Hemodynamically stable for discharge today. Discharge Exam: Vitals:   02/15/20 2145 02/16/20 0436  BP: (!) 142/72 (!) 124/48  Pulse: (!) 108 95  Resp: 17 17  Temp: 100.1 F (37.8 C) 98.8 F (37.1 C)  SpO2: 100% 100%   Vitals:   02/15/20 0520 02/15/20 1411 02/15/20 2145 02/16/20 0436  BP: (!) 138/57 115/62 (!) 142/72 (!) 124/48  Pulse: (!) 102 95 (!) 108 95  Resp: Temp: 98.9 F (37.2 C) 98.4 F (36.9 C) 100.1 F (37.8 C) 98.8 F (37.1 C)  TempSrc: Oral Oral Oral Oral  SpO2: 100% 100% 100% 100%  Weight:      Height:        General: Pt is alert, awake, not in acute distress,obese Cardiovascular: RRR, S1/S2 +, no rubs, no gallops Respiratory: CTA bilaterally, no wheezing, no rhonchi Abdominal: Soft, NT, ND, bowel sounds + Extremities: trace edema, no cyanosis    The results of significant diagnostics from this hospitalization (including imaging, microbiology, ancillary and laboratory) are listed below for reference.     Microbiology: Recent Results (from the past 240 hour(s))  MRSA PCR Screening     Status: None   Collection Time: 02/08/20  3:30 PM   Specimen: Nasal Mucosa; Nasopharyngeal  Result Value Ref Range Status   MRSA by PCR NEGATIVE NEGATIVE Final    Comment:        The GeneXpert MRSA Assay (FDA approved for NASAL specimens only), is one component of a comprehensive MRSA colonization surveillance program. It is not intended to diagnose MRSA infection nor to guide or monitor treatment for MRSA infections. Performed at [facility], [address], [place], [place] [address]      Labs: BNP (last 3 results) Recent Labs    01/30/20 1538  BNP CANCELED   Basic Metabolic Panel: Recent Labs  Lab 02/10/20 0338 02/11/20 0410 02/12/20 0330 02/13/20 0327 02/14/20 0449  02/15/20 0436 02/16/20 0423  NA  --   --  140  --  140 140 140  K  --   --  3.7  --  3.5 3.5 3.6  CL  --   --  104  --  104 104 103  CO2  --   --  25  --  GLUCOSE  --   --  99  --  92 105* 102*  BUN  --   --  7  --  CREATININE  --   --  0.56  --  0.56 0.64 0.61  CALCIUM  --   --  8.7*  --  8.9 8.7* 9.0  MG 1.6* 1.8 1.8 1.9  --  1.7 1.8  PHOS 3.9 4.2  --   --   --   --   --    Liver Function Tests: Recent  Labs  Lab 02/12/20 0330 02/14/20 0449  AST 71* 51*  ALT 33 29  ALKPHOS 28* 26*  BILITOT 0.6 0.6  PROT 5.8* 6.0*  ALBUMIN 2.8* 2.9*   No results for input(s): LIPASE, AMYLASE in the last 168 hours. No results for input(s): AMMONIA in the last 168 hours. CBC: Recent Labs  Lab 02/12/20 0330 02/14/20 0449 02/15/20 0436  WBC 11.7* 10.0 8.7  NEUTROABS 7.6 6.2  --   HGB 9.8* 9.4* 9.3*  HCT 30.7* 29.7* 29.1*  MCV 91.1 91.7 93.0  PLT 393 373 373   Cardiac Enzymes: Recent Labs  Lab 02/11/20 0410 02/12/20 0330 02/13/20 0327 02/15/20 0436 02/16/20 0423  CKTOTAL 1,967* 1,740* 1,517* 745* 532*   BNP: Invalid input(s): POCBNP CBG: No results for input(s): GLUCAP in the last 168 hours. D-Dimer No results for input(s): DDIMER in the last 72 hours. Hgb A1c No results for input(s): HGBA1C in the last 72 hours. Lipid Profile No results for input(s): CHOL, HDL, LDLCALC, TRIG, CHOLHDL, LDLDIRECT in the last 72 hours. Thyroid function studies No results for input(s): TSH, T4TOTAL, T3FREE, THYROIDAB in the last 72 hours.  Invalid input(s): FREET3 Anemia work up No results for input(s): VITAMINB12, FOLATE, FERRITIN, TIBC, IRON, RETICCTPCT in the last 72 hours. Urinalysis    Component Value Date/Time   COLORURINE YELLOW 01/30/2020 2012   APPEARANCEUR HAZY (A) 01/30/2020 2012   LABSPEC 1.029 01/30/2020 2012   PHURINE 5.0 01/30/2020 2012   GLUCOSEU NEGATIVE 01/30/2020 2012   HGBUR MODERATE (A) 01/30/2020 2012   BILIRUBINUR NEGATIVE 01/30/2020 2012    BILIRUBINUR negative 01/30/2020 1441   KETONESUR NEGATIVE 01/30/2020 2012   PROTEINUR 30 (A) 01/30/2020 2012   UROBILINOGEN 0.2 01/30/2020 1441   UROBILINOGEN 0.2 03/30/2013 1044   NITRITE NEGATIVE 01/30/2020 2012   LEUKOCYTESUR NEGATIVE 01/30/2020 2012   Sepsis Labs Invalid input(s): PROCALCITONIN,  WBC,  LACTICIDVEN Microbiology Recent Results (from the past 240 hour(s))  MRSA PCR Screening     Status: None   Collection Time: 02/08/20  3:30 PM   Specimen: Nasal Mucosa; Nasopharyngeal  Result Value Ref Range Status   MRSA by PCR NEGATIVE NEGATIVE Final    Comment:        The GeneXpert MRSA Assay (FDA approved for NASAL specimens only), is one component of a comprehensive MRSA colonization surveillance program. It is not intended to diagnose MRSA infection nor to guide or monitor treatment for MRSA infections. Performed at [facility], [address] [place], [place] [address]     Please note: You were cared for by a hospitalist during your hospital stay. Once you are discharged, your primary care physician will handle any further medical issues. Please note that NO REFILLS for any discharge medications will be authorized once you are discharged, as it is imperative that you return to your primary care physician (or establish a relationship with a primary care physician if you do not have one) for your post hospital discharge needs so that they can reassess your need for medications and monitor your lab values.    Time coordinating discharge: 40 minutes  SIGNED:   Burnadette Pop, MD  [facility] 02/16/2020, 8:57 AM Pager [phone]  If 7PM-7AM, please contact night-coverage [email or web address] Password [email or web address]

## 2020-02-16 NOTE — Progress Notes (Signed)
Occupational Therapy Treatment Patient Details Name: Haley Sandoval MRN: [id number] DOB: 09-13-2000 Today's Date: 02/16/2020    History of present illness 19 year old female with past medical history of rhabdomyolysis, pelvic inflammatory disease admitted with sepsis 2* pelvic inflammatory disease, R buttock abscess.   OT comments  Upon arrival patient seated in recliner dressed, reports waiting for her ride home. Pt still reports difficulty with don/doff socks d/t LE swelling. Educated patient is use of sock aid, pt return demo at mod I level. Also demo use of reacher to doff socks at mod I level. Patient denies any further questions/concerns for this OT at this time.   Follow Up Recommendations  Supervision - Intermittent;Home health OT    Equipment Recommendations  None recommended by OT       Precautions / Restrictions Precautions Precautions: Fall Precaution Comments: monitor HR       Mobility Bed Mobility               General bed mobility comments: up in recliner         ADL either performed or assessed with clinical judgement   ADL Overall ADL's : Needs assistance/impaired                     Lower Body Dressing: Sitting/lateral leans;Modified independent;With adaptive equipment Lower Body Dressing Details (indicate cue type and reason): patient dressed for D/C home however reports still needing assistance with doff/don socks. Patient educated in use of sock aid with patient return demo at mod I level. patient also demo use of reacher to doff socks as she is wearing crocs home/doesn't want socks on. patient very appreciative                               Cognition Arousal/Alertness: Awake/alert Behavior During Therapy: WFL for tasks assessed/performed Overall Cognitive Status: Within Functional Limits for tasks assessed                                                     Pertinent Vitals/ Pain       Pain  Assessment: Faces Faces Pain Scale: Hurts a little bit Pain Location: LEs Pain Descriptors / Indicators: Other (Comment);Heaviness;Tightness (swollen) Pain Intervention(s): Monitored during session         Frequency  Min 2X/week        Progress Toward Goals  OT Goals(current goals can now be found in the care plan section)  Progress towards OT goals: Progressing toward goals  Acute Rehab OT Goals Patient Stated Goal: return home with family to assist OT Goal Formulation: With patient Time For Goal Achievement: 02/26/20 ADL Goals Pt Will Perform Lower Body Bathing: with modified independence;sit to/from stand Pt Will Perform Lower Body Dressing: with modified independence;sit to/from stand;with adaptive equipment Pt Will Perform Tub/Shower Transfer: with modified independence Pt/caregiver will Perform Home Exercise Program: Both right and left upper extremity;With written HEP provided;With theraband  Plan Discharge plan remains appropriate       AM-PAC OT "6 Clicks" Daily Activity     Outcome Measure   Help from another person eating meals?: None Help from another person taking care of personal grooming?: None Help from another person toileting, which includes using toliet, bedpan, or urinal?: A Little Help from another person  bathing (including washing, rinsing, drying)?: A Little Help from another person to put on and taking off regular upper body clothing?: None Help from another person to put on and taking off regular lower body clothing?: None 6 Click Score: 22    End of Session  OT Visit Diagnosis: Pain Pain - Right/Left:  (bilateral) Pain - part of body: Leg   Activity Tolerance Patient tolerated treatment well   Patient Left in chair           Time: 3500-9381 OT Time Calculation (min): 12 min  Charges: OT General Charges $OT Visit: 1 Visit OT Treatments $Self Care/Home Management : 8-22 mins  Marlyce Huge OT OT pager: [phone]   Carmelia Roller 02/16/2020, 2:57 PM

## 2020-02-16 NOTE — TOC Progression Note (Signed)
Transition of Care [facility]) - Progression Note    Patient Details  Name: ORETA SOLOWAY MRN: [id number] Date of Birth: [birth year]  Transition of [facility]) CM/SW Contact  Armanda Heritage, RN Phone Number: 02/16/2020, 1:38 PM  Clinical Narrative:    [facility] services.   Patient reports has 3in1 at home, no need for a new one.   Expected Discharge Plan: Home w Home Health Services Barriers to Discharge: Continued Medical Work up  Expected Discharge Plan and Services Expected Discharge Plan: Home w Home Health Services In-house Referral: Clinical Social Work     Living arrangements for the past 2 months: Single Family Home Expected Discharge Date: 02/16/20                         HH Arranged: RN HH Agency: [facility] Date HH Agency Contacted: 02/07/20 Time HH Agency Contacted: 1516 Representative spoke with at [facility] Agency: Amy   Social Determinants of Health (SDOH) Interventions    Readmission Risk Interventions Readmission Risk Prevention Plan 02/04/2020  Transportation Screening Complete  PCP or Specialist Appt within 5-7 Days Complete  Home Care Screening Complete  Medication Review (RN CM) Complete  Some recent data might be hidden

## 2020-02-16 NOTE — Consult Note (Signed)
WOC consult requested for right buttock wound.  Pt had I&D performed in the ED and orders for dressing changes have already been provided for daily Iodoform packing to the location and patient states bedside nurse performed the procedure this morning.  No further role for WOC nurse.  Please re-consult if further assistance is needed.  Thank-you,  Cammie Mcgee MSN, RN, CWOCN, [place], CNS [phone]

## 2020-02-16 NOTE — Plan of Care (Signed)
  Problem: Education: Goal: Knowledge of General Education information will improve Description: Including pain rating scale, medication(s)/side effects and non-pharmacologic comfort measures Outcome: Progressing   Problem: Clinical Measurements: Goal: Ability to maintain clinical measurements within normal limits will improve Outcome: Progressing   

## 2020-02-17 ENCOUNTER — Telehealth: Payer: Self-pay

## 2020-02-17 NOTE — Progress Notes (Signed)
CM received message from floor RN stating patient's mother has called the floor and reported that the home health agency set up to provide nursing has called and indicated that they cannot provide nursing care and patient will need to go to outpatient wound center. CM reached out to [facility] rep regarding this situation and was informed that unfortunately at this time, staffing constraints have resulted in agency being unable to staff this case.  Agency rep reports that they are working with the outpatient wound center to get patient an appointment to be seen there.  CM reached out to all other are [facility] agencies to see if there was any availability to staff this case, unfortunately all agencies have declined.  At this time there is no option to find another home health agency.  [facility] rep reports that an appointment has been made with the [place] wound care center [phone]) for next week, but the center is looking to see if there is any way to get the patient seen earlier.  Agency rep reports the wound care center has been in contact with patient's mother and agency rep also intends to contact patient and mother to discuss this situation further and assist in any way.

## 2020-02-17 NOTE — Telephone Encounter (Signed)
Transition Care Management Follow-up Telephone Call  Date of discharge and from where: 02/16/2020 [facility].   How have you been since you were released from the hospital? Right eye swelling. But feeling over all better.   Any questions or concerns? No   Items Reviewed:  Did the pt receive and understand the discharge instructions provided? Yes   Medications obtained and verified? Yes   Other? No   Any new allergies since your discharge? No   Dietary orders reviewed? Yes  Do you have support at home? Yes   Home Care and Equipment/Supplies: Were home health services ordered? yes 1 If so, what is the name of the agency? [facility]  Has the agency set up a time to come to the patient's home? no Were any new equipment or medical supplies ordered?  No What is the name of the medical supply agency? na Were you able to get the supplies/equipment? no Do you have any questions related to the use of the equipment or supplies? No   Patient stated she contacted [facility] and they stated they did not have her in their system for Home Health Nurse. I will refax summary from chart to [facility] to [phone].  Functional Questionnaire: (I = Independent and D = Dependent) ADLs: I  Bathing/Dressing- I  Meal Prep- I  Eating- I  Maintaining continence- I  Transferring/Ambulation- I  Managing Meds- I  Follow up appointments reviewed:   PCP Hospital f/u appt confirmed? Yes  Scheduled to see Thad Ranger, NP on 02/26/2020 @ 2:40pm.  Specialist Hospital f/u appt confirmed? Yes  Scheduled to see [facility], [place], [place] on 03/05/2020 @ 8am.  Are transportation arrangements needed? No   If their condition worsens, is the pt aware to call PCP or go to the Emergency Dept.? Yes  Was the patient provided with contact information for the PCP's office or ED? Yes  Was to pt encouraged to call back with questions or concerns? Yes

## 2020-02-25 ENCOUNTER — Ambulatory Visit: Payer: Medicaid Other | Admitting: Internal Medicine

## 2020-02-26 ENCOUNTER — Inpatient Hospital Stay: Payer: Medicaid Other | Admitting: Nurse Practitioner

## 2020-02-29 ENCOUNTER — Encounter (HOSPITAL_COMMUNITY): Payer: Self-pay

## 2020-02-29 ENCOUNTER — Other Ambulatory Visit: Payer: Self-pay

## 2020-02-29 ENCOUNTER — Emergency Department (HOSPITAL_COMMUNITY)
Admission: EM | Admit: 2020-02-29 | Discharge: 2020-02-29 | Disposition: A | Discharge status: Home / Self Care | Payer: Medicaid Other | Attending: Emergency Medicine | Admitting: Emergency Medicine

## 2020-02-29 DIAGNOSIS — R059 Cough, unspecified: Secondary | ICD-10-CM | POA: Diagnosis not present

## 2020-02-29 DIAGNOSIS — R439 Unspecified disturbances of smell and taste: Secondary | ICD-10-CM | POA: Diagnosis not present

## 2020-02-29 DIAGNOSIS — J029 Acute pharyngitis, unspecified: Secondary | ICD-10-CM | POA: Insufficient documentation

## 2020-02-29 DIAGNOSIS — Z5321 Procedure and treatment not carried out due to patient leaving prior to being seen by health care provider: Secondary | ICD-10-CM | POA: Diagnosis not present

## 2020-02-29 HISTORY — DX: Rhabdomyolysis: M62.82

## 2020-02-29 HISTORY — DX: Cutaneous abscess, unspecified: L02.91

## 2020-02-29 NOTE — ED Triage Notes (Signed)
Patient reports that she has been exposed to Covid. Patient c/o sore throat, slight cough. Loss of taste. Patient also reports that she was recently hospitalized for rhabdo

## 2020-03-01 ENCOUNTER — Other Ambulatory Visit: Payer: Medicaid Other

## 2020-03-01 DIAGNOSIS — Z20822 Contact with and (suspected) exposure to covid-19: Secondary | ICD-10-CM

## 2020-03-03 ENCOUNTER — Ambulatory Visit: Payer: Medicaid Other | Admitting: Internal Medicine

## 2020-03-04 ENCOUNTER — Inpatient Hospital Stay (HOSPITAL_COMMUNITY)
Admission: EM | Admit: 2020-03-04 | Discharge: 2020-03-16 | DRG: 557 | Disposition: A | Discharge status: Home Health | Payer: Medicaid Other | Attending: Family Medicine | Admitting: Family Medicine

## 2020-03-04 ENCOUNTER — Other Ambulatory Visit: Payer: Self-pay

## 2020-03-04 ENCOUNTER — Encounter (HOSPITAL_COMMUNITY): Payer: Self-pay

## 2020-03-04 DIAGNOSIS — R1310 Dysphagia, unspecified: Secondary | ICD-10-CM

## 2020-03-04 DIAGNOSIS — Z8249 Family history of ischemic heart disease and other diseases of the circulatory system: Secondary | ICD-10-CM

## 2020-03-04 DIAGNOSIS — K224 Dyskinesia of esophagus: Secondary | ICD-10-CM | POA: Diagnosis present

## 2020-03-04 DIAGNOSIS — E871 Hypo-osmolality and hyponatremia: Secondary | ICD-10-CM | POA: Diagnosis present

## 2020-03-04 DIAGNOSIS — U071 COVID-19: Secondary | ICD-10-CM | POA: Diagnosis not present

## 2020-03-04 DIAGNOSIS — E8809 Other disorders of plasma-protein metabolism, not elsewhere classified: Secondary | ICD-10-CM | POA: Diagnosis present

## 2020-03-04 DIAGNOSIS — R6510 Systemic inflammatory response syndrome (SIRS) of non-infectious origin without acute organ dysfunction: Secondary | ICD-10-CM | POA: Diagnosis present

## 2020-03-04 DIAGNOSIS — M791 Myalgia, unspecified site: Secondary | ICD-10-CM | POA: Diagnosis not present

## 2020-03-04 DIAGNOSIS — R7401 Elevation of levels of liver transaminase levels: Secondary | ICD-10-CM | POA: Diagnosis present

## 2020-03-04 DIAGNOSIS — R0989 Other specified symptoms and signs involving the circulatory and respiratory systems: Secondary | ICD-10-CM

## 2020-03-04 DIAGNOSIS — M6282 Rhabdomyolysis: Secondary | ICD-10-CM | POA: Diagnosis not present

## 2020-03-04 DIAGNOSIS — R601 Generalized edema: Secondary | ICD-10-CM | POA: Diagnosis present

## 2020-03-04 DIAGNOSIS — M609 Myositis, unspecified: Secondary | ICD-10-CM | POA: Diagnosis present

## 2020-03-04 DIAGNOSIS — E861 Hypovolemia: Secondary | ICD-10-CM | POA: Diagnosis present

## 2020-03-04 DIAGNOSIS — D72829 Elevated white blood cell count, unspecified: Secondary | ICD-10-CM | POA: Diagnosis present

## 2020-03-04 DIAGNOSIS — R748 Abnormal levels of other serum enzymes: Secondary | ICD-10-CM | POA: Diagnosis present

## 2020-03-04 DIAGNOSIS — R21 Rash and other nonspecific skin eruption: Secondary | ICD-10-CM | POA: Diagnosis present

## 2020-03-04 DIAGNOSIS — Z6841 Body Mass Index (BMI) 40.0 and over, adult: Secondary | ICD-10-CM

## 2020-03-04 MED ORDER — SODIUM CHLORIDE 0.9 % IV BOLUS
1000.0000 mL | Freq: Once | INTRAVENOUS | Status: AC
  Administered 2020-03-05: 1000 mL via INTRAVENOUS

## 2020-03-04 NOTE — ED Notes (Signed)
Pt refused EKG due to having diffuse rash over chest from previous EKG

## 2020-03-04 NOTE — ED Notes (Signed)
Attempted IV, infiltrated. Will re-attempt.

## 2020-03-04 NOTE — ED Triage Notes (Addendum)
Patient was recently hospitalized with rhabdo. Patient continues to c/o feeling tired and has a rash from EKG electrodes from when she was hospitalized. HR- 140 in triage.

## 2020-03-04 NOTE — Progress Notes (Deleted)
Referring Provider: Halford Chessman, MD Primary Care Physician:  Vevelyn Francois, NP Primary GI Physician: Dr. Abbey Chatters  No chief complaint on file.   HPI:   Haley Sandoval is a 20 y.o. female presenting today for hospital follow-up of elevated LFTs.   We saw patient during hospitalization 01/18/2020 - 01/22/2020 for elevated LFTs suspected to be secondary to rhabdomyolysis.  Patient presented to the emergency room with 10-day history of severe muscle aches and was found to have elevated CK greater than 31,000 and elevated transaminases with AST 528, ALT 142, alk phos and T bili normal. Denied any trauma, fall or anyknownmusculoskeletal/immunological disorder in the family. No baseline LFTs to compare to.  Denied OTC herbal supplements/meds or alcohol.  Isolated Tylenol use.  Only medication prior to onset was doxycycline for presumed buttock furuncle. Viral hepatitis panel negative. CT A/P with contrast with no focal liver lesions, gallbladder, or biliary abnormality.  CT did reveal mild, nonspecific bilateral posterior abdominal and pelvic inflammatory fat stranding which could represent sequela associated with mild mesenteritis and/or PID.  She was started on IV antibiotics, IV fluids, and IV steroids.  LFTs were improving with supportive measures as CK was downtrending.  Etiology of nontraumatic rhabdomyolysis was not clear.  At time of discharge, CK had come down to 12,199, AST 339, ALT 152, alk phos and total bilirubin remain normal.  She was discharged on steroid taper, narcotic pain medications, and doxycycline.  Also advised to see rheumatology for follow-up to evaluate for myositis.  She was again admitted 01/30/2020- 02/16/2020 with sepsis in the setting of right buttock abscess and PID.  Also with ongoing rhabdomyolysis, and volume overload.  CK elevated at 11,283, lactic acid 2.2, CRP 5.8, sed rate 37, WBC 20.8, AST 270, ALT 78.  Repeat CT pelvis with persistent PID with some improvement.   GC and Chlamydia negative.  Right buttock abscess was drained and she was started on empiric antibiotics and IV fluids. Sepsis resolved.  Due to volume overload, she then received Lasix with volume overload improving.  At discharge, CK had trended down to 532.  Had discussed possible liver biopsy, but rheumatology recommended holding off and following up outpatient as CK trended down without additional steroids.  LFTs on 12/25 had improved with AST 51, ALT 29.  She was discharged on Lasix 20 mg daily x7 days and oxycodone for pain control.  According to discharge summary, appears patient has follow-up with Dr. Benjamine Mola (rheumatologist) on 1/17.  Today:    Past Medical History:  Diagnosis Date  . Abscess   . Rhabdomyolysis   . Seasonal allergies     Past Surgical History:  Procedure Laterality Date  . WISDOM TOOTH EXTRACTION      Current Outpatient Medications  Medication Sig Dispense Refill  . furosemide (LASIX) 20 MG tablet Take 1 tablet (20 mg total) by mouth daily for 7 days. 7 tablet 0  . magnesium oxide (MAG-OX) 400 (241.3 Mg) MG tablet Take 1 tablet (400 mg total) by mouth daily. 60 tablet 0  . medroxyPROGESTERone (DEPO-PROVERA) 150 MG/ML injection Inject 150 mg into the muscle every 3 (three) months.    . methocarbamol (ROBAXIN) 500 MG tablet Take 1 tablet (500 mg total) by mouth every 6 (six) hours as needed for muscle spasms. (Patient not taking: Reported on 01/31/2020) 30 tablet 0  . oxyCODONE (OXY IR/ROXICODONE) 5 MG immediate release tablet Take 1 tablet (5 mg total) by mouth every 4 (four) hours as needed for moderate  pain or severe pain. 15 tablet 0  . polyethylene glycol (MIRALAX / GLYCOLAX) 17 g packet Take 17 g by mouth daily as needed. 14 each 0   No current facility-administered medications for this visit.    Allergies as of 03/05/2020  . (No Known Allergies)    Family History  Problem Relation Age of Onset  . Hypertension Mother   . Colon cancer Neg Hx   . Colon  polyps Neg Hx   . Liver disease Neg Hx   . Sickle cell anemia Neg Hx     Social History   Socioeconomic History  . Marital status: Single    Spouse name: Not on file  . Number of children: Not on file  . Years of education: Not on file  . Highest education level: Not on file  Occupational History  . Not on file  Tobacco Use  . Smoking status: Never Smoker  . Smokeless tobacco: Never Used  Vaping Use  . Vaping Use: Never used  Substance and Sexual Activity  . Alcohol use: Not Currently  . Drug use: Not Currently  . Sexual activity: Yes  Other Topics Concern  . Not on file  Social History Narrative  . Not on file   Social Determinants of Health   Financial Resource Strain: Not on file  Food Insecurity: No Food Insecurity  . Worried About Charity fundraiser in the Last Year: Never true  . Ran Out of Food in the Last Year: Never true  Transportation Needs: No Transportation Needs  . Lack of Transportation (Medical): No  . Lack of Transportation (Non-Medical): No  Physical Activity: Not on file  Stress: Not on file  Social Connections: Not on file    Review of Systems: Gen: Denies fever, chills, anorexia. Denies fatigue, weakness, weight loss.  CV: Denies chest pain, palpitations, syncope, peripheral edema, and claudication. Resp: Denies dyspnea at rest, cough, wheezing, coughing up blood, and pleurisy. GI: Denies vomiting blood, jaundice, and fecal incontinence.   Denies dysphagia or odynophagia. Derm: Denies rash, itching, dry skin Psych: Denies depression, anxiety, memory loss, confusion. No homicidal or suicidal ideation.  Heme: Denies bruising, bleeding, and enlarged lymph nodes.  Physical Exam: There were no vitals taken for this visit. General:   Alert and oriented. No distress noted. Pleasant and cooperative.  Head:  Normocephalic and atraumatic. Eyes:  Conjuctiva clear without scleral icterus. Mouth:  Oral mucosa pink and moist. Good dentition. No  lesions. Heart:  S1, S2 present without murmurs appreciated. Lungs:  Clear to auscultation bilaterally. No wheezes, rales, or rhonchi. No distress.  Abdomen:  +BS, soft, non-tender and non-distended. No rebound or guarding. No HSM or masses noted. Msk:  Symmetrical without gross deformities. Normal posture. Extremities:  Without edema. Neurologic:  Alert and  oriented x4 Psych:  Alert and cooperative. Normal mood and affect.

## 2020-03-05 ENCOUNTER — Ambulatory Visit: Payer: Medicaid Other | Admitting: Gastroenterology

## 2020-03-05 ENCOUNTER — Emergency Department (HOSPITAL_COMMUNITY): Payer: Medicaid Other

## 2020-03-05 ENCOUNTER — Encounter (HOSPITAL_COMMUNITY): Payer: Self-pay | Admitting: Family Medicine

## 2020-03-05 DIAGNOSIS — R21 Rash and other nonspecific skin eruption: Secondary | ICD-10-CM | POA: Diagnosis not present

## 2020-03-05 DIAGNOSIS — M791 Myalgia, unspecified site: Secondary | ICD-10-CM

## 2020-03-05 DIAGNOSIS — R601 Generalized edema: Secondary | ICD-10-CM | POA: Diagnosis not present

## 2020-03-05 DIAGNOSIS — R1310 Dysphagia, unspecified: Secondary | ICD-10-CM | POA: Diagnosis not present

## 2020-03-05 DIAGNOSIS — E871 Hypo-osmolality and hyponatremia: Secondary | ICD-10-CM | POA: Diagnosis not present

## 2020-03-05 DIAGNOSIS — E861 Hypovolemia: Secondary | ICD-10-CM | POA: Diagnosis not present

## 2020-03-05 DIAGNOSIS — G7249 Other inflammatory and immune myopathies, not elsewhere classified: Secondary | ICD-10-CM | POA: Insufficient documentation

## 2020-03-05 DIAGNOSIS — M6282 Rhabdomyolysis: Principal | ICD-10-CM

## 2020-03-05 DIAGNOSIS — R6510 Systemic inflammatory response syndrome (SIRS) of non-infectious origin without acute organ dysfunction: Secondary | ICD-10-CM | POA: Diagnosis not present

## 2020-03-05 DIAGNOSIS — E876 Hypokalemia: Secondary | ICD-10-CM | POA: Diagnosis not present

## 2020-03-05 DIAGNOSIS — U071 COVID-19: Secondary | ICD-10-CM | POA: Diagnosis not present

## 2020-03-05 DIAGNOSIS — Z8616 Personal history of COVID-19: Secondary | ICD-10-CM | POA: Diagnosis not present

## 2020-03-05 DIAGNOSIS — W1839XA Other fall on same level, initial encounter: Secondary | ICD-10-CM | POA: Diagnosis not present

## 2020-03-05 DIAGNOSIS — D72829 Elevated white blood cell count, unspecified: Secondary | ICD-10-CM | POA: Diagnosis not present

## 2020-03-05 DIAGNOSIS — Y9389 Activity, other specified: Secondary | ICD-10-CM | POA: Diagnosis not present

## 2020-03-05 DIAGNOSIS — F329 Major depressive disorder, single episode, unspecified: Secondary | ICD-10-CM | POA: Diagnosis not present

## 2020-03-05 DIAGNOSIS — M609 Myositis, unspecified: Secondary | ICD-10-CM | POA: Diagnosis not present

## 2020-03-05 DIAGNOSIS — R600 Localized edema: Secondary | ICD-10-CM | POA: Diagnosis not present

## 2020-03-05 DIAGNOSIS — M79605 Pain in left leg: Secondary | ICD-10-CM | POA: Diagnosis not present

## 2020-03-05 DIAGNOSIS — Y92008 Other place in unspecified non-institutional (private) residence as the place of occurrence of the external cause: Secondary | ICD-10-CM | POA: Diagnosis not present

## 2020-03-05 DIAGNOSIS — Z8249 Family history of ischemic heart disease and other diseases of the circulatory system: Secondary | ICD-10-CM | POA: Diagnosis not present

## 2020-03-05 DIAGNOSIS — R7401 Elevation of levels of liver transaminase levels: Secondary | ICD-10-CM | POA: Diagnosis not present

## 2020-03-05 DIAGNOSIS — M79604 Pain in right leg: Secondary | ICD-10-CM | POA: Diagnosis not present

## 2020-03-05 DIAGNOSIS — D649 Anemia, unspecified: Secondary | ICD-10-CM | POA: Diagnosis not present

## 2020-03-05 DIAGNOSIS — R748 Abnormal levels of other serum enzymes: Secondary | ICD-10-CM

## 2020-03-05 DIAGNOSIS — Z6841 Body Mass Index (BMI) 40.0 and over, adult: Secondary | ICD-10-CM | POA: Diagnosis not present

## 2020-03-05 DIAGNOSIS — Y999 Unspecified external cause status: Secondary | ICD-10-CM | POA: Diagnosis not present

## 2020-03-05 DIAGNOSIS — E8809 Other disorders of plasma-protein metabolism, not elsewhere classified: Secondary | ICD-10-CM | POA: Diagnosis not present

## 2020-03-05 DIAGNOSIS — K224 Dyskinesia of esophagus: Secondary | ICD-10-CM | POA: Diagnosis not present

## 2020-03-05 DIAGNOSIS — R000 Tachycardia, unspecified: Secondary | ICD-10-CM | POA: Diagnosis not present

## 2020-03-05 DIAGNOSIS — R531 Weakness: Secondary | ICD-10-CM | POA: Diagnosis present

## 2020-03-05 LAB — CBC
HCT: 39.2 % (ref 36.0–46.0)
HCT: 45.2 % (ref 36.0–46.0)
Hemoglobin: 12.8 g/dL (ref 12.0–15.0)
Hemoglobin: 14.4 g/dL (ref 12.0–15.0)
MCH: 28.4 pg (ref 26.0–34.0)
MCH: 28.6 pg (ref 26.0–34.0)
MCHC: 31.9 g/dL (ref 30.0–36.0)
MCHC: 32.7 g/dL (ref 30.0–36.0)
MCV: 86.9 fL (ref 80.0–100.0)
MCV: 89.7 fL (ref 80.0–100.0)
Platelets: 306 10*3/uL (ref 150–400)
Platelets: 312 10*3/uL (ref 150–400)
RBC: 4.51 MIL/uL (ref 3.87–5.11)
RBC: 5.04 MIL/uL (ref 3.87–5.11)
RDW: 15.6 % — ABNORMAL HIGH (ref 11.5–15.5)
RDW: 15.9 % — ABNORMAL HIGH (ref 11.5–15.5)
WBC: 13.8 10*3/uL — ABNORMAL HIGH (ref 4.0–10.5)
WBC: 15.4 10*3/uL — ABNORMAL HIGH (ref 4.0–10.5)
nRBC: 0 % (ref 0.0–0.2)
nRBC: 0 % (ref 0.0–0.2)

## 2020-03-05 LAB — URINALYSIS, COMPLETE (UACMP) WITH MICROSCOPIC
Bilirubin Urine: NEGATIVE
Glucose, UA: NEGATIVE mg/dL
Ketones, ur: NEGATIVE mg/dL
Leukocytes,Ua: NEGATIVE
Nitrite: NEGATIVE
Protein, ur: 100 mg/dL — AB
Specific Gravity, Urine: 1.026 (ref 1.005–1.030)
pH: 5 (ref 5.0–8.0)

## 2020-03-05 LAB — C-REACTIVE PROTEIN: CRP: 5.2 mg/dL — ABNORMAL HIGH (ref <1.0)

## 2020-03-05 LAB — RESP PANEL BY RT-PCR (FLU A&B, COVID) ARPGX2
Influenza A by PCR: NEGATIVE
Influenza B by PCR: NEGATIVE
SARS Coronavirus 2 by RT PCR: POSITIVE — AB

## 2020-03-05 LAB — COMPREHENSIVE METABOLIC PANEL
ALT: 48 U/L — ABNORMAL HIGH (ref 0–44)
ALT: 57 U/L — ABNORMAL HIGH (ref 0–44)
AST: 243 U/L — ABNORMAL HIGH (ref 15–41)
AST: 281 U/L — ABNORMAL HIGH (ref 15–41)
Albumin: 3 g/dL — ABNORMAL LOW (ref 3.5–5.0)
Albumin: 3.5 g/dL (ref 3.5–5.0)
Alkaline Phosphatase: 29 U/L — ABNORMAL LOW (ref 38–126)
Alkaline Phosphatase: 35 U/L — ABNORMAL LOW (ref 38–126)
Anion gap: 12 (ref 5–15)
Anion gap: 13 (ref 5–15)
BUN: 16 mg/dL (ref 6–20)
BUN: 20 mg/dL (ref 6–20)
CO2: 20 mmol/L — ABNORMAL LOW (ref 22–32)
CO2: 20 mmol/L — ABNORMAL LOW (ref 22–32)
Calcium: 8.7 mg/dL — ABNORMAL LOW (ref 8.9–10.3)
Calcium: 9 mg/dL (ref 8.9–10.3)
Chloride: 97 mmol/L — ABNORMAL LOW (ref 98–111)
Chloride: 99 mmol/L (ref 98–111)
Creatinine, Ser: 0.61 mg/dL (ref 0.44–1.00)
Creatinine, Ser: 0.81 mg/dL (ref 0.44–1.00)
GFR, Estimated: >60 mL/min (ref >60)
GFR, Estimated: >60 mL/min (ref >60)
Glucose, Bld: 100 mg/dL — ABNORMAL HIGH (ref 70–99)
Glucose, Bld: 106 mg/dL — ABNORMAL HIGH (ref 70–99)
Potassium: 4.6 mmol/L (ref 3.5–5.1)
Potassium: 4.9 mmol/L (ref 3.5–5.1)
Sodium: 130 mmol/L — ABNORMAL LOW (ref 135–145)
Sodium: 131 mmol/L — ABNORMAL LOW (ref 135–145)
Total Bilirubin: 0.5 mg/dL (ref 0.3–1.2)
Total Bilirubin: 0.5 mg/dL (ref 0.3–1.2)
Total Protein: 6.3 g/dL — ABNORMAL LOW (ref 6.5–8.1)
Total Protein: 7.3 g/dL (ref 6.5–8.1)

## 2020-03-05 LAB — PROCALCITONIN: Procalcitonin: 0.13 ng/mL

## 2020-03-05 LAB — RAPID URINE DRUG SCREEN, HOSP PERFORMED
Amphetamines: NOT DETECTED
Barbiturates: NOT DETECTED
Benzodiazepines: NOT DETECTED
Cocaine: NOT DETECTED
Opiates: NOT DETECTED
Tetrahydrocannabinol: POSITIVE — AB

## 2020-03-05 LAB — LACTIC ACID, PLASMA
Lactic Acid, Venous: 2.8 mmol/L (ref 0.5–1.9)
Lactic Acid, Venous: 2.2 mmol/L (ref 0.5–1.9)

## 2020-03-05 LAB — OSMOLALITY, URINE: Osmolality, Ur: 682 mOsm/kg (ref 300–900)

## 2020-03-05 LAB — LIPASE, BLOOD: Lipase: 20 U/L (ref 11–51)

## 2020-03-05 LAB — SODIUM, URINE, RANDOM: Sodium, Ur: <10 mmol/L

## 2020-03-05 LAB — MAGNESIUM: Magnesium: 2.2 mg/dL (ref 1.7–2.4)

## 2020-03-05 LAB — HCG, QUANTITATIVE, PREGNANCY: hCG, Beta Chain, Quant, S: 1 m[IU]/mL (ref <5)

## 2020-03-05 LAB — TSH: TSH: 1.231 u[IU]/mL (ref 0.350–4.500)

## 2020-03-05 LAB — SEDIMENTATION RATE: Sed Rate: 75 mm/hr — ABNORMAL HIGH (ref 0–22)

## 2020-03-05 LAB — CK
Total CK: 11713 U/L — ABNORMAL HIGH (ref 38–234)
Total CK: 9530 U/L — ABNORMAL HIGH (ref 38–234)

## 2020-03-05 MED ORDER — ONDANSETRON HCL 4 MG PO TABS: 4.0000 mg | ORAL_TABLET | Freq: Four times a day (QID) | ORAL | Status: DC | PRN

## 2020-03-05 MED ORDER — SODIUM CHLORIDE 0.9 % IV BOLUS
1000.0000 mL | Freq: Once | INTRAVENOUS | Status: AC
  Administered 2020-03-05: 1000 mL via INTRAVENOUS

## 2020-03-05 MED ORDER — LACTATED RINGERS IV BOLUS
1000.0000 mL | Freq: Once | INTRAVENOUS | Status: AC
  Administered 2020-03-05: 1000 mL via INTRAVENOUS

## 2020-03-05 MED ORDER — SODIUM CHLORIDE 0.9 % IV SOLN: INTRAVENOUS | Status: AC

## 2020-03-05 MED ORDER — SODIUM CHLORIDE 0.9% FLUSH
10.0000 mL | Freq: Two times a day (BID) | INTRAVENOUS | Status: DC
  Administered 2020-03-06 (×2): 10 mL
  Administered 2020-03-06: 20 mL
  Administered 2020-03-07 – 2020-03-16 (×16): 10 mL

## 2020-03-05 MED ORDER — FLUCONAZOLE 100MG IVPB
100.0000 mg | INTRAVENOUS | Status: DC
  Administered 2020-03-05: 100 mg via INTRAVENOUS
  Filled 2020-03-05 (×2): qty 50

## 2020-03-05 MED ORDER — SILVER SULFADIAZINE 1 % EX CREA
TOPICAL_CREAM | Freq: Two times a day (BID) | CUTANEOUS | Status: DC
  Administered 2020-03-05 – 2020-03-10 (×4): 1 via TOPICAL
  Filled 2020-03-05 (×2): qty 50

## 2020-03-05 MED ORDER — FENTANYL CITRATE (PF) 100 MCG/2ML IJ SOLN
50.0000 ug | INTRAMUSCULAR | Status: DC | PRN
  Administered 2020-03-05 (×2): 50 ug via INTRAVENOUS
  Filled 2020-03-05 (×2): qty 2

## 2020-03-05 MED ORDER — ONDANSETRON HCL 4 MG/2ML IJ SOLN: 4.0000 mg | Freq: Four times a day (QID) | INTRAMUSCULAR | Status: DC | PRN

## 2020-03-05 MED ORDER — OXYCODONE HCL 5 MG PO TABS
5.0000 mg | ORAL_TABLET | ORAL | Status: DC | PRN
  Administered 2020-03-05 – 2020-03-06 (×2): 5 mg via ORAL
  Filled 2020-03-05 (×2): qty 1

## 2020-03-05 MED ORDER — SENNOSIDES-DOCUSATE SODIUM 8.6-50 MG PO TABS: 1.0000 | ORAL_TABLET | Freq: Every evening | ORAL | Status: DC | PRN

## 2020-03-05 MED ORDER — FENTANYL CITRATE (PF) 100 MCG/2ML IJ SOLN
100.0000 ug | Freq: Once | INTRAMUSCULAR | Status: AC
  Administered 2020-03-05: 100 ug via INTRAVENOUS
  Filled 2020-03-05: qty 2

## 2020-03-05 MED ORDER — ENOXAPARIN SODIUM 40 MG/0.4ML ~~LOC~~ SOLN
40.0000 mg | SUBCUTANEOUS | Status: DC
  Administered 2020-03-05 – 2020-03-06 (×2): 40 mg via SUBCUTANEOUS
  Filled 2020-03-05 (×2): qty 0.4

## 2020-03-05 MED ORDER — SODIUM CHLORIDE 0.9% FLUSH: 10.0000 mL | INTRAVENOUS | Status: DC | PRN

## 2020-03-05 MED ORDER — TRAMADOL HCL 50 MG PO TABS
50.0000 mg | ORAL_TABLET | Freq: Three times a day (TID) | ORAL | Status: DC | PRN
  Administered 2020-03-05 – 2020-03-12 (×11): 50 mg via ORAL
  Filled 2020-03-05 (×12): qty 1

## 2020-03-05 NOTE — ED Notes (Signed)
Patient refused cardiac monitoring due to chest wound

## 2020-03-05 NOTE — ED Notes (Signed)
Date and time results received: 03/05/20 2:52 AM  (use smartphrase ".now" to insert current time)  Test: COVID+  Critical Value: POSITIVE  Name of Provider Notified: Bebe Shaggy, MD  Orders Received? Or Actions Taken?: Orders Received - See Orders for details

## 2020-03-05 NOTE — Progress Notes (Signed)
Pt is newly admitted to 5 [facility] room 1535 she is in a yellow MEWS due to  temp 101.1 HR 143. She was oriented to the unit. PT is alert and Ox4

## 2020-03-05 NOTE — ED Notes (Signed)
Date and time results received: 03/05/20 1:22 AM  Test: Lactic Critical Value: 2.8  Name of Provider Notified: Bebe Shaggy, MD  Orders Received? Or Actions Taken?: Orders Received - See Orders for details

## 2020-03-05 NOTE — H&P (Signed)
History and Physical    Haley Sandoval DOB: 01-Jun-2000 DOA: 03/04/2020  PCP: Vevelyn Francois, NP   Patient coming from: Home   Chief Complaint: Muscle aches and weakness, rash, fatigue   HPI: Haley Sandoval is a 20 y.o. female with medical history significant for seasonal allergy and 2 recent admissions for rhabdomyolysis, now presenting to the emergency department with worsening aches and weakness involving the bilateral lower extremities and proximal upper extremities bilaterally.  Patient reports that she seemed to improve a little bit during the 2 recent admissions but began to worsen again within a few days of discharge.  She is also developed a rash on her chest that began with swelling and tenderness during the recent admission, and while the swelling and tenderness is decreased, there has been some desquamation and crusting.  She has also had some periorbital edema since the recent admission which seems to have improved but she now has some mild periorbital erythema.  She also developed a cough 1 to 2 weeks ago, sore throat, and fatigue.  She denies shortness of breath or chest pain.    During recent admissions: Surgery was consulted for muscle biopsy but after discussion with Dr. Benjamine Mola of rheumatology, decision was made to hold off on that as the patient seemed to be improving.  She had an MRI pelvis and right femur last month notable for severe diffuse myositis.  She was treated with antibiotics for suspected PID and soft tissue infection involving the right buttock, but pelvic ultrasound and MRI were unremarkable and chlamydia and gonorrhea testing were negative.  Culture from the right buttock wound grew normal skin flora.  Blood cultures were negative.  Viral hepatitis testing was negative.  ANA, SSA, and SSB were negative.  CRP was elevated to 5.8 and ESR normal.  Plan was for patient to follow-up with Dr. Benjamine Mola of rheumatology, but unfortunately her mother passed away  unexpectedly and she was unable to follow through with this. She was diuresed during the recent hospitalization and went home with Lasix and reports that her lower extremity swelling has resolved.    ED Course: Upon arrival to the ED, patient is found to be afebrile, saturating well on room air, tachycardic to 140, and with stable blood pressure.  Chemistry panel notable for sodium 130, AST 281, and ALT 57.  Serum CK is elevated at 11,713.  CBC notable for leukocytosis to 15,400.  Lactic acid elevated to 2.8.  COVID PCR is positive.  Chest x-ray negative for acute cardiopulmonary disease.  Patient was treated with 2 L of IV fluids and fentanyl in the ED.  ED physician discussed the case with rheumatology at both [facility] and [facility], no beds were available for transfer to either institution, and recommendations were made for IV fluids, muscle biopsy, EMG, and no steroids at this time.  Review of Systems:  All other systems reviewed and apart from HPI, are negative.  Past Medical History:  Diagnosis Date  . Abscess   . Rhabdomyolysis   . Seasonal allergies     Past Surgical History:  Procedure Laterality Date  . WISDOM TOOTH EXTRACTION      Social History:   reports that she has never smoked. She has never used smokeless tobacco. She reports previous alcohol use. She reports previous drug use.  No Known Allergies  Family History  Problem Relation Age of Onset  . Hypertension Mother   . Colon cancer Neg Hx   . Colon  polyps Neg Hx   . Liver disease Neg Hx   . Sickle cell anemia Neg Hx      Prior to Admission medications   Medication Sig Start Date End Date Taking? Authorizing Provider  furosemide (LASIX) 20 MG tablet Take 1 tablet (20 mg total) by mouth daily for 7 days. 02/16/20 02/23/20 Yes Adhikari, Tamsen Meek, MD  magnesium oxide (MAG-OX) 400 (241.3 Mg) MG tablet Take 1 tablet (400 mg total) by mouth daily. 02/16/20  Yes Shelly Coss, MD    Physical Exam: Vitals:    03/05/20 0027 03/05/20 0100 03/05/20 0237 03/05/20 0300  BP: (!) 129/92 (!) 172/92 (!) 170/85 (!) 176/87  Pulse: (!) 130 (!) 127 (!) 127 (!) 126  Resp: 16 19 (!) 22 (!) 22  Temp:      TempSrc:      SpO2: 100% 98% 99% 99%  Weight:      Height:        Constitutional: NAD, calm  Eyes: PERTLA, faint infraorbital erythema  ENMT: Mucous membranes are moist. Posterior pharynx clear of any exudate or lesions.   Neck: normal, supple, no masses, no thyromegaly Respiratory:  no wheezing, no crackles. No accessory muscle use.  Cardiovascular: S1 & S2 heard, regular rate and rhythm. No extremity edema.   Abdomen: No distension, no tenderness, soft. Bowel sounds active.  Musculoskeletal: no clubbing / cyanosis. Tender proximal UEs and LEs b/l, compartments soft.    Skin: Hypopigmentation and crust anterior chest. Warm, dry, well-perfused. Neurologic: CN 2-12 grossly intact. Sensation intact. Moving all extremities.  Psychiatric: Alert and oriented to person, place, and situation. Very pleasant and cooperative.    Labs and Imaging on Admission: I have personally reviewed following labs and imaging studies  CBC: Recent Labs  Lab 03/04/20 2348  WBC 15.4*  HGB 14.4  HCT 45.2  MCV 89.7  PLT 888   Basic Metabolic Panel: Recent Labs  Lab 03/04/20 2349  NA 130*  K 4.9  CL 97*  CO2 20*  GLUCOSE 106*  BUN 20  CREATININE 0.81  CALCIUM 9.0  MG 2.2   GFR: Estimated Creatinine Clearance: 154.1 mL/min (by C-G formula based on SCr of 0.81 mg/dL). Liver Function Tests: Recent Labs  Lab 03/04/20 2349  AST 281*  ALT 57*  ALKPHOS 35*  BILITOT 0.5  PROT 7.3  ALBUMIN 3.5   Recent Labs  Lab 03/04/20 2349  LIPASE 20   No results for input(s): AMMONIA in the last 168 hours. Coagulation Profile: No results for input(s): INR, PROTIME in the last 168 hours. Cardiac Enzymes: Recent Labs  Lab 03/04/20 2348  CKTOTAL 11,713*   BNP (last 3 results) No results for input(s): PROBNP in  the last 8760 hours. HbA1C: No results for input(s): HGBA1C in the last 72 hours. CBG: No results for input(s): GLUCAP in the last 168 hours. Lipid Profile: No results for input(s): CHOL, HDL, LDLCALC, TRIG, CHOLHDL, LDLDIRECT in the last 72 hours. Thyroid Function Tests: No results for input(s): TSH, T4TOTAL, FREET4, T3FREE, THYROIDAB in the last 72 hours. Anemia Panel: No results for input(s): VITAMINB12, FOLATE, FERRITIN, TIBC, IRON, RETICCTPCT in the last 72 hours. Urine analysis:    Component Value Date/Time   COLORURINE YELLOW 01/30/2020 2012   APPEARANCEUR HAZY (A) 01/30/2020 2012   LABSPEC 1.029 01/30/2020 2012   PHURINE 5.0 01/30/2020 2012   GLUCOSEU NEGATIVE 01/30/2020 2012   HGBUR MODERATE (A) 01/30/2020 2012   BILIRUBINUR NEGATIVE 01/30/2020 2012   BILIRUBINUR negative 01/30/2020 1441   KETONESUR  NEGATIVE 01/30/2020 2012   PROTEINUR 30 (A) 01/30/2020 2012   UROBILINOGEN 0.2 01/30/2020 1441   UROBILINOGEN 0.2 03/30/2013 1044   NITRITE NEGATIVE 01/30/2020 2012   LEUKOCYTESUR NEGATIVE 01/30/2020 2012   Sepsis Labs: '@LABRCNTIP' (procalcitonin:4,lacticidven:4) ) Recent Results (from the past 240 hour(s))  Resp Panel by RT-PCR (Flu A&B, Covid) Nasopharyngeal Swab     Status: Abnormal   Collection Time: 03/05/20 12:13 AM   Specimen: Nasopharyngeal Swab; Nasopharyngeal(NP) swabs in vial transport medium  Result Value Ref Range Status   SARS Coronavirus 2 by RT PCR POSITIVE (A) NEGATIVE Final    Comment: RESULT CALLED TO, READ BACK BY AND VERIFIED WITH: SARA, RN @ 2440 ON 03/05/20 C VARNER (NOTE) SARS-CoV-2 target nucleic acids are DETECTED.  The SARS-CoV-2 RNA is generally detectable in upper respiratory specimens during the acute phase of infection. Positive results are indicative of the presence of the identified virus, but do not rule out bacterial infection or co-infection with other pathogens not detected by the test. Clinical correlation with patient history  and other diagnostic information is necessary to determine patient infection status. The expected result is Negative.  Fact Sheet for Patients: EntrepreneurPulse.com.au  Fact Sheet for Healthcare Providers: IncredibleEmployment.be  This test is not yet approved or cleared by the [place] FDA and  has been authorized for detection and/or diagnosis of SARS-CoV-2 by FDA under an Emergency Use Authorization (EUA).  This EUA will remain in effect (meaning this test can  be used) for the duration of  the COVID-19 declaration under Section 564(b)(1) of the Act, 21 U.S.C. section 360bbb-3(b)(1), unless the authorization is terminated or revoked sooner.     Influenza A by PCR NEGATIVE NEGATIVE Final   Influenza B by PCR NEGATIVE NEGATIVE Final    Comment: (NOTE) The Xpert Xpress SARS-CoV-2/FLU/RSV plus assay is intended as an aid in the diagnosis of influenza from Nasopharyngeal swab specimens and should not be used as a sole basis for treatment. Nasal washings and aspirates are unacceptable for Xpert Xpress SARS-CoV-2/FLU/RSV testing.  Fact Sheet for Patients: EntrepreneurPulse.com.au  Fact Sheet for Healthcare Providers: IncredibleEmployment.be  This test is not yet approved or cleared by the [place] FDA and has been authorized for detection and/or diagnosis of SARS-CoV-2 by FDA under an Emergency Use Authorization (EUA). This EUA will remain in effect (meaning this test can be used) for the duration of the COVID-19 declaration under Section 564(b)(1) of the Act, 21 U.S.C. section 360bbb-3(b)(1), unless the authorization is terminated or revoked.  Performed at [facility], [facility] [address], [place], [place] [address]      Radiological Exams on Admission: DG Chest Port 1 View  Result Date: 03/05/2020 CLINICAL DATA:  COVID-19 EXAM: PORTABLE CHEST 1 VIEW COMPARISON:  01/18/2020  FINDINGS: The heart size and mediastinal contours are within normal limits. Both lungs are clear. The visualized skeletal structures are unremarkable. IMPRESSION: No active disease. Electronically Signed   By: Ulyses Jarred M.D.   On: 03/05/2020 03:24    Assessment/Plan   1. Myositis  - Presents with worsening myalgias and weakness involving proximal UEs and LEs after improving some during recent admissions and is found to have increased CK with preserved renal function and elevated transaminases without benign abd exam and recent negative viral hepatitis panel  - MRI right femur and pelvis during recent admission notable for severe diffuse myositis  - ED physician spoke with rheumatologists at [facility] (Dr. Graylin Shiver) and [facility] (Dr. Fredonia Highland), there were no beds available for transfer,  but recommendations were given to continue IVF, hold steroids, obtain muscle biopsy, and EMG  - Plan to continue IVF, transfer patient when bed available, and consider surgery and neurology consultations for biopsy and EMG if there is a long wait for transfer    2. Rash  - Presents with rash on chest, reports it began with tenderness and edema 2 wks ago and now has scale and crust (see picture in Dr. Deanna Artis note) as well as faint periorbital erythema  - Does not appear infected, possibly related to #1, no beds available at any local institutions with dermatology consultants, plan for supportive care for now    3. SIRS  - Leukocytosis, tachycardia noted on admission without fever or apparent bacterial infection  - She had similar presentation with last 2 admissions and had undetectable procalcitonin and negative cultures  - Plan to culture if febrile, check procalcitonin, watch off of antibiotics for now    4. COVID-19  - She reports 7-10 days mild non-productive cough and sore throat and is found to have COVID-19  - No tachypnea or hypoxia, CXR clear  - Supportive care, isolation    5. Hyponatremia  - Serum sodium is  130 on admission  - She appears euvolemic  - Check urine sodium and osm, TSH - Repeat chem panel in am    6. Elevated transaminases  - AST is 281 and ALT 57 on admission  - Likely secondary to #1, no RUQ tenderness, viral hepatitis panel negative during recent admission  - Continue IVF hydration and trend    DVT prophylaxis: Lovenox  Code Status: Full  Family Communication: Discussed with patient  Disposition Plan:  Patient is from: Home  Anticipated d/c is to: TBD, likely transfer to other institution  Anticipated d/c date is: 03/09/20 Patient currently: Pending further evaluation of myopathy, likely muscle biopsy though may be transferred prior to that   Consults called: ED discussed with rheumatology at [facility] where there are no beds available for transfer  Admission status: Inpatient     Vianne Bulls, MD [facility]  03/05/2020, 5:17 AM

## 2020-03-05 NOTE — ED Provider Notes (Signed)
[facility]-EMERGENCY DEPT Provider Note   CSN: [id number] Arrival date & time: 03/04/20  1453     History Chief Complaint  Patient presents with  . rash  . Fatigue  . Tachycardia    Haley Sandoval is a 20 y.o. female.  20 year old female with prior medical history as detailed above presents for evaluation of reported myalgias and weakness.  Patient reports symptoms have been ongoing since recent admission.  Patient was diagnosed rhabdomyolysis and myositis at that time of admission.  Patient reports that after discharge she felt mildly improved for several days but then began to have diffuse muscle aches and pains.  She denies fever.  She reports that her wound on her buttock is improved.  She complains also of rash to the anterior chest wall which was present at time of prior admission as well.  The history is provided by the patient and medical records.  Illness Location:  Myalgia Severity:  Moderate Onset quality:  Gradual Duration:  2 weeks Timing:  Constant Progression:  Worsening Associated symptoms: no fever        Past Medical History:  Diagnosis Date  . Abscess   . Rhabdomyolysis   . Seasonal allergies     Patient Active Problem List   Diagnosis Date Noted  . Sepsis (HCC) 01/31/2020  . Morbidly obese (HCC) 01/31/2020  . Cellulitis 01/30/2020  . Atypical pigmented skin lesion 01/29/2020  . Non-intractable vomiting   . Constipation   . Hepatitis   . Rhabdomyolysis 01/18/2020  . Elevated liver enzymes 01/18/2020  . Acute febrile illness 01/18/2020  . Hypoalbuminemia 01/18/2020  . Hyponatremia 01/18/2020  . Obesity, Class III, BMI 40-49.9 (morbid obesity) (HCC) 01/18/2020    Past Surgical History:  Procedure Laterality Date  . WISDOM TOOTH EXTRACTION       OB History   No obstetric history on file.     Family History  Problem Relation Age of Onset  . Hypertension Mother   . Colon cancer Neg Hx   . Colon polyps Neg Hx    . Liver disease Neg Hx   . Sickle cell anemia Neg Hx     Social History   Tobacco Use  . Smoking status: Never Smoker  . Smokeless tobacco: Never Used  Vaping Use  . Vaping Use: Never used  Substance Use Topics  . Alcohol use: Not Currently  . Drug use: Not Currently    Home Medications Prior to Admission medications   Medication Sig Start Date End Date Taking? Authorizing Provider  furosemide (LASIX) 20 MG tablet Take 1 tablet (20 mg total) by mouth daily for 7 days. 02/16/20 02/23/20  Burnadette Pop, MD  magnesium oxide (MAG-OX) 400 (241.3 Mg) MG tablet Take 1 tablet (400 mg total) by mouth daily. 02/16/20   Burnadette Pop, MD  medroxyPROGESTERone (DEPO-PROVERA) 150 MG/ML injection Inject 150 mg into the muscle every 3 (three) months.    [provider]  methocarbamol (ROBAXIN) 500 MG tablet Take 1 tablet (500 mg total) by mouth every 6 (six) hours as needed for muscle spasms. Patient not taking: Reported on 01/31/2020 01/22/20   Maurilio Lovely D, DO  oxyCODONE (OXY IR/ROXICODONE) 5 MG immediate release tablet Take 1 tablet (5 mg total) by mouth every 4 (four) hours as needed for moderate pain or severe pain. 02/16/20   Burnadette Pop, MD  polyethylene glycol (MIRALAX / GLYCOLAX) 17 g packet Take 17 g by mouth daily as needed. 02/16/20   Burnadette Pop, MD  Allergies    Patient has no known allergies.  Review of Systems   Review of Systems  Constitutional: Negative for fever.  All other systems reviewed and are negative.   Physical Exam Updated Vital Signs BP 105/79 (BP Location: Left Arm)   Pulse (!) 142   Temp 97.9 F (36.6 C) (Oral)   Resp (!) 22   Ht 5\' 5"  (1.651 m)   Wt 134.7 kg   LMP  (LMP Unknown)   SpO2 100%   BMI 49.42 kg/m   Physical Exam Vitals and nursing note reviewed.  Constitutional:      General: She is not in acute distress.    Appearance: She is well-developed and well-nourished.  HENT:     Head: Normocephalic and atraumatic.      Mouth/Throat:     Mouth: Oropharynx is clear and moist.  Eyes:     Extraocular Movements: EOM normal.     Conjunctiva/sclera: Conjunctivae normal.     Pupils: Pupils are equal, round, and reactive to light.  Cardiovascular:     Rate and Rhythm: Normal rate and regular rhythm.     Heart sounds: Normal heart sounds.  Pulmonary:     Effort: Pulmonary effort is normal. No respiratory distress.     Breath sounds: Normal breath sounds.  Abdominal:     General: There is no distension.     Palpations: Abdomen is soft.     Tenderness: There is no abdominal tenderness.  Musculoskeletal:        General: No deformity or edema. Normal range of motion.     Cervical back: Normal range of motion and neck supple.  Skin:    General: Skin is warm and dry.     Comments: Rash to anterior chest wall - see image below.   Right buttock shows healed site of I&D.  No erythema or evidence of recurrent abscess noted.  Neurological:     Mental Status: She is alert and oriented to person, place, and time.  Psychiatric:        Mood and Affect: Mood and affect normal.         ED Results / Procedures / Treatments   Labs (all labs ordered are listed, but only abnormal results are displayed) Labs Reviewed  CBC - Abnormal; Notable for the following components:      Result Value   WBC 15.4 (*)    RDW 15.9 (*)    All other components within normal limits  COMPREHENSIVE METABOLIC PANEL - Abnormal; Notable for the following components:   Sodium 130 (*)    Chloride 97 (*)    CO2 20 (*)    Glucose, Bld 106 (*)    AST 281 (*)    ALT 57 (*)    Alkaline Phosphatase 35 (*)    All other components within normal limits  RESP PANEL BY RT-PCR (FLU A&B, COVID) ARPGX2  LIPASE, BLOOD  CK  LACTIC ACID, PLASMA  LACTIC ACID, PLASMA  MAGNESIUM  HCG, QUANTITATIVE, PREGNANCY    EKG None  Radiology No results found.  Procedures Procedures (including critical care time)  Medications Ordered in  ED Medications  sodium chloride 0.9 % bolus 1,000 mL (has no administration in time range)    ED Course  I have reviewed the triage vital signs and the nursing notes.  Pertinent labs & imaging results that were available during my care of the patient were reviewed by me and considered in my medical decision making (see chart  for details).    MDM Rules/Calculators/A&P                          MDM  Screen complete  Haley Sandoval was evaluated in Emergency Department on 03/05/2020 for the symptoms described in the history of present illness. She was evaluated in the context of the global COVID-19 pandemic, which necessitated consideration that the patient might be at risk for infection with the SARS-CoV-2 virus that causes COVID-19. Institutional protocols and algorithms that pertain to the evaluation of patients at risk for COVID-19 are in a state of rapid change based on information released by regulatory bodies including the CDC and federal and state organizations. These policies and algorithms were followed during the patient's care in the ED.  Patient is  presenting for evaluation of reported myalgia.  Patient with recent admission from 12/10 through 12/27 - she was diagnosed with rhabdomyolysis and possible some myositis.  See recent discharge summary for additional details.  Patient reports to this examiner that she is still taking Lasix.  It appears that she was only supposed to take it for several days after discharge.  Patient is presenting with worsening myalgia per her report.  She is concerned that she may be in rhabdo again.  Labs to be obtained.  Case signed out to Dr. Bebe Shaggy.   Final Clinical Impression(s) / ED Diagnoses Final diagnoses:  Myalgia    Rx / DC Orders ED Discharge Orders    None       Wynetta Fines, MD 03/05/20 0040

## 2020-03-05 NOTE — Progress Notes (Signed)
[facility] PROGRESS NOTE    Progress Note  Haley Sandoval  YTK:[id number] DOB: 08/05/2000 DOA: 03/04/2020 PCP: Barbette Merino, NP     Brief Narrative:   Haley Sandoval is an 20 y.o. female no significant past medical history comes in for her second admission of rhabdomyolysis with worsening body aches bilateral lower and upper extremity she had a CK of 12,000, [facility] rheumatology recommended IV fluids muscle biopsy EMG and no steroids at this time.  Assessment/Plan:   Muscle ache, rash and rhabdomyolysis: Mother CK on admission of 12,000. No aggressive IV fluid hydration her CK is 9000 we will give her a bolus and continue IV fluids. CRP is elevated procalcitonin is low yield she has a mild, 13 but she is afebrile. We will get surgery and neurology, due to her COVID positivity and her being relatively stable she is not a candidate for surgical biopsy after speaking to the surgeon. Check a CK in the morning. Rheumatologic work-up is pending, previous ANA was negative. As noted on 03/04/2020. Urine sodium was less than 10.  Elevated liver enzymes: Likely due to rhabdo which really will improve with IV fluid hydration.  Hypovolemic hyponatremia Urinary sodium less than 10 continue aggressive IV fluid hydration.  SIRS (systemic inflammatory response syndrome) (HCC) He was not septic on admission, due to inflammatory possibly autoimmune process.  COVID-19 virus infection Noted continue isolation no respiratory symptoms.   DVT prophylaxis: scd Family Communication:none Status is: Inpatient  Remains inpatient appropriate because:Hemodynamically unstable   Dispo: The patient is from: Home              Anticipated d/c is to: Home              Anticipated d/c date is: > 3 days              Patient currently is not medically stable to d/c.        Code Status:     Code Status Orders  (From admission, onward)         Start     Ordered   03/05/20 0445  Full  code  Continuous        03/05/20 0446        Code Status History    Date Active Date Inactive Code Status Order ID Comments User Context   01/31/2020 0148 02/16/2020 2056 Full Code [id number]  Eduard Clos, MD Inpatient   01/19/2020 0012 01/22/2020 2203 Full Code [id number]  Frankey Shown, DO ED   Advance Care Planning Activity        IV Access:    Peripheral IV   Procedures and diagnostic studies:   DG Chest Port 1 View  Result Date: 03/05/2020 CLINICAL DATA:  COVID-19 EXAM: PORTABLE CHEST 1 VIEW COMPARISON:  01/18/2020 FINDINGS: The heart size and mediastinal contours are within normal limits. Both lungs are clear. The visualized skeletal structures are unremarkable. IMPRESSION: No active disease. Electronically Signed   By: Deatra Robinson M.D.   On: 03/05/2020 03:24     Medical Consultants:    None.  Anti-Infectives:   None  Subjective:    Haley Sandoval continues to be weak.  Objective:    Vitals:   03/05/20 0100 03/05/20 0237 03/05/20 0300 03/05/20 0700  BP: (!) 172/92 (!) 170/85 (!) 176/87 (!) 134/44  Pulse: (!) 127 (!) 127 (!) 126 (!) 129  Resp: 19 (!) 22 (!) 22   Temp:      TempSrc:  SpO2: 98% 99% 99% 100%  Weight:      Height:       SpO2: 100 %  No intake or output data in the 24 hours ending 03/05/20 0730 Filed Weights   03/04/20 1513  Weight: 134.7 kg    Exam: General exam: In no acute distress. Respiratory system: Good air movement and clear to auscultation. Cardiovascular system: S1 & S2 heard, RRR. No JVD. Gastrointestinal system: Abdomen is nondistended, soft and nontender.  Extremities: No pedal edema. Skin: No rashes, lesions or ulcers Psychiatry: Judgement and insight appear normal. Mood & affect appropriate.   Data Reviewed:    Labs: Basic Metabolic Panel: Recent Labs  Lab 03/04/20 2349 03/05/20 0525  NA 130* 131*  K 4.9 4.6  CL 97* 99  CO2 20* 20*  GLUCOSE 106* 100*  BUN 20 16  CREATININE  0.81 0.61  CALCIUM 9.0 8.7*  MG 2.2  --    GFR Estimated Creatinine Clearance: 156 mL/min (by C-G formula based on SCr of 0.61 mg/dL). Liver Function Tests: Recent Labs  Lab 03/04/20 2349 03/05/20 0525  AST 281* 243*  ALT 57* 48*  ALKPHOS 35* 29*  BILITOT 0.5 0.5  PROT 7.3 6.3*  ALBUMIN 3.5 3.0*   Recent Labs  Lab 03/04/20 2349  LIPASE 20   No results for input(s): AMMONIA in the last 168 hours. Coagulation profile No results for input(s): INR, PROTIME in the last 168 hours. COVID-19 Labs  Recent Labs    03/05/20 0524  CRP 5.2*    Lab Results  Component Value Date   SARSCOV2NAA POSITIVE (A) 03/05/2020   SARSCOV2NAA NEGATIVE 01/30/2020   SARSCOV2NAA NEGATIVE 01/18/2020    CBC: Recent Labs  Lab 03/04/20 2348 03/05/20 0525  WBC 15.4* 13.8*  HGB 14.4 12.8  HCT 45.2 39.2  MCV 89.7 86.9  PLT 306 312   Cardiac Enzymes: Recent Labs  Lab 03/04/20 2348 03/05/20 0525  CKTOTAL 11,713* 9,530*   BNP (last 3 results) No results for input(s): PROBNP in the last 8760 hours. CBG: No results for input(s): GLUCAP in the last 168 hours. D-Dimer: No results for input(s): DDIMER in the last 72 hours. Hgb A1c: No results for input(s): HGBA1C in the last 72 hours. Lipid Profile: No results for input(s): CHOL, HDL, LDLCALC, TRIG, CHOLHDL, LDLDIRECT in the last 72 hours. Thyroid function studies: Recent Labs    03/05/20 0530  TSH 1.231   Anemia work up: No results for input(s): VITAMINB12, FOLATE, FERRITIN, TIBC, IRON, RETICCTPCT in the last 72 hours. Sepsis Labs: Recent Labs  Lab 03/04/20 2348 03/05/20 0013 03/05/20 0240 03/05/20 0525  WBC 15.4*  --   --  13.8*  LATICACIDVEN  --  2.8* 2.2*  --    Microbiology Recent Results (from the past 240 hour(s))  Resp Panel by RT-PCR (Flu A&B, Covid) Nasopharyngeal Swab     Status: Abnormal   Collection Time: 03/05/20 12:13 AM   Specimen: Nasopharyngeal Swab; Nasopharyngeal(NP) swabs in vial transport medium   Result Value Ref Range Status   SARS Coronavirus 2 by RT PCR POSITIVE (A) NEGATIVE Final    Comment: RESULT CALLED TO, READ BACK BY AND VERIFIED WITH: SARA, RN @ 0252 ON 03/05/20 C VARNER (NOTE) SARS-CoV-2 target nucleic acids are DETECTED.  The SARS-CoV-2 RNA is generally detectable in upper respiratory specimens during the acute phase of infection. Positive results are indicative of the presence of the identified virus, but do not rule out bacterial infection or co-infection with other pathogens not  detected by the test. Clinical correlation with patient history and other diagnostic information is necessary to determine patient infection status. The expected result is Negative.  Fact Sheet for Patients: BloggerCourse.com  Fact Sheet for Healthcare Providers: SeriousBroker.it  This test is not yet approved or cleared by the [place] FDA and  has been authorized for detection and/or diagnosis of SARS-CoV-2 by FDA under an Emergency Use Authorization (EUA).  This EUA will remain in effect (meaning this test can  be used) for the duration of  the COVID-19 declaration under Section 564(b)(1) of the Act, 21 U.S.C. section 360bbb-3(b)(1), unless the authorization is terminated or revoked sooner.     Influenza A by PCR NEGATIVE NEGATIVE Final   Influenza B by PCR NEGATIVE NEGATIVE Final    Comment: (NOTE) The Xpert Xpress SARS-CoV-2/FLU/RSV plus assay is intended as an aid in the diagnosis of influenza from Nasopharyngeal swab specimens and should not be used as a sole basis for treatment. Nasal washings and aspirates are unacceptable for Xpert Xpress SARS-CoV-2/FLU/RSV testing.  Fact Sheet for Patients: BloggerCourse.com  Fact Sheet for Healthcare Providers: SeriousBroker.it  This test is not yet approved or cleared by the [place] FDA and has been authorized for  detection and/or diagnosis of SARS-CoV-2 by FDA under an Emergency Use Authorization (EUA). This EUA will remain in effect (meaning this test can be used) for the duration of the COVID-19 declaration under Section 564(b)(1) of the Act, 21 U.S.C. section 360bbb-3(b)(1), unless the authorization is terminated or revoked.  Performed at [facility], [address], [place], [place] [address]      Medications:   . enoxaparin (LOVENOX) injection  40 mg Subcutaneous Q24H   Continuous Infusions: . sodium chloride 125 mL/hr at 03/05/20 0620  . sodium chloride        LOS: 0 days   Marinda Elk  [facility]  03/05/2020, 7:30 AM

## 2020-03-05 NOTE — Progress Notes (Signed)
Consult received for muscle biopsy in patient with recurrent rhabdomyolysis. Would not recommend at present with patient being COVID positive. Increased risk of complications with general anesthesia has been seen even in asymptomatic COVID patients and this is not something that needs to be done emergently. Patient can certainly call and schedule follow up in our office and this could even be scheduled to be done as an outpatient procedure. I will provide our office information in AVS.   Juliet Rude, [facility] 03/05/2020, 8:41 AM Please see Amion for pager number during day hours 7:00am-4:30pm

## 2020-03-05 NOTE — ED Provider Notes (Signed)
Patient has recurrent rhabdomyolysis of unclear etiology. She is tachycardic. She has been given IV fluids. Discussed with Dr. Antionette Char with [facility]. He request that I consult rheumatology at an outside facility as he does not believe that rheumatology can see her in this hospital. Will consult [facility]   Zadie Rhine, [place] 03/05/20 [phone]

## 2020-03-05 NOTE — ED Provider Notes (Signed)
.  Critical Care Performed by: Zadie Rhine, MD Authorized by: Zadie Rhine, MD   Critical care provider statement:    Critical care time (minutes):  60   Critical care start time:  03/05/2020 3:00 AM   Critical care end time:  03/05/2020 4:00 AM   Critical care time was exclusive of:  Separately billable procedures and treating other patients   Critical care was necessary to treat or prevent imminent or life-threatening deterioration of the following conditions:  Metabolic crisis and dehydration   Critical care was time spent personally by me on the following activities:  Ordering and review of laboratory studies, re-evaluation of patient's condition, review of old charts, examination of patient, development of treatment plan with patient or surrogate, pulse oximetry and evaluation of patient's response to treatment   I assumed direction of critical care for this patient from another provider in my specialty: no     Care discussed with: admitting provider     I spoke to Dr. Erroll Luna at [facility] as well as Dr. Denyce Robert at [facility] with rheumatology.  Dr. Lorre Munroe suggests withholding steroids, but patient will need EMG as well as muscle biopsy.  We should continue IV fluids.  On initial history it does not sound like an inflammatory myositis.  Patient also found to have COVID-19 Plan was endorsed to Dr. Antionette Char for admission.  He has asked me to place the patient on the list for transfer to [facility] Haley Sandoval was evaluated in Emergency Department on 03/05/2020 for the symptoms described in the history of present illness. She was evaluated in the context of the global COVID-19 pandemic, which necessitated consideration that the patient might be at risk for infection with the SARS-CoV-2 virus that causes COVID-19. Institutional protocols and algorithms that pertain to the evaluation of patients at risk for COVID-19 are in a state of rapid change based on information released by regulatory  bodies including the CDC and federal and state organizations. These policies and algorithms were followed during the patient's care in the ED.    Zadie Rhine, MD 03/05/20 [phone]

## 2020-03-05 NOTE — Consult Note (Signed)
WOC Nurse Consult Note: Reason for Consult: WOC Nurse consulted for area of dry desquamation on chest. See photo provided to the EMR by Dr.T.Opyd at 23:53 on 01/02/21. 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 This will be topped with a saline-dampened gauze dressing )(opened) so that it doesn't dry out and adhere to dressing. The damp gauze is to be topped with an ABD pad and secured with paper tape. Changed are to be twice daily and may be continued for 21 days.  I have requested the Bedside RN via the Orders to measure and document the measurements of the affected area on the chest on the Nursing Flow Sheet today prior to first dressing change with silver sulfadiazine.  When available, patient would benefit from care oversight with rheumatology. At this timer and according to Dr. Francesco Runner note, neither [facility] nor [facility] has available beds for transfer.  WOC nursing team will not follow, but will remain available to this patient, the nursing and medical teams.  Please re-consult if needed. Thanks, Ladona Mow, MSN, RN, GNP, [place]  Pager# [phone]

## 2020-03-05 NOTE — ED Notes (Signed)
Called [facility] at 248am for Rheamotology per Bebe Shaggy

## 2020-03-05 NOTE — ED Notes (Signed)
Called [facility] at 321 per Dr Bebe Shaggy for Rheamtology

## 2020-03-06 DIAGNOSIS — U071 COVID-19: Secondary | ICD-10-CM | POA: Diagnosis not present

## 2020-03-06 DIAGNOSIS — E871 Hypo-osmolality and hyponatremia: Secondary | ICD-10-CM | POA: Diagnosis not present

## 2020-03-06 DIAGNOSIS — R748 Abnormal levels of other serum enzymes: Secondary | ICD-10-CM | POA: Diagnosis not present

## 2020-03-06 DIAGNOSIS — M6282 Rhabdomyolysis: Secondary | ICD-10-CM | POA: Diagnosis not present

## 2020-03-06 LAB — COMPREHENSIVE METABOLIC PANEL
ALT: 43 U/L (ref 0–44)
AST: 212 U/L — ABNORMAL HIGH (ref 15–41)
Albumin: 2.4 g/dL — ABNORMAL LOW (ref 3.5–5.0)
Alkaline Phosphatase: 28 U/L — ABNORMAL LOW (ref 38–126)
Anion gap: 10 (ref 5–15)
BUN: 16 mg/dL (ref 6–20)
CO2: 18 mmol/L — ABNORMAL LOW (ref 22–32)
Calcium: 8.3 mg/dL — ABNORMAL LOW (ref 8.9–10.3)
Chloride: 102 mmol/L (ref 98–111)
Creatinine, Ser: 0.64 mg/dL (ref 0.44–1.00)
GFR, Estimated: >60 mL/min (ref >60)
Glucose, Bld: 96 mg/dL (ref 70–99)
Potassium: 4.8 mmol/L (ref 3.5–5.1)
Sodium: 130 mmol/L — ABNORMAL LOW (ref 135–145)
Total Bilirubin: 0.4 mg/dL (ref 0.3–1.2)
Total Protein: 5.4 g/dL — ABNORMAL LOW (ref 6.5–8.1)

## 2020-03-06 LAB — CBC
HCT: 39.2 % (ref 36.0–46.0)
Hemoglobin: 12.5 g/dL (ref 12.0–15.0)
MCH: 28.7 pg (ref 26.0–34.0)
MCHC: 31.9 g/dL (ref 30.0–36.0)
MCV: 89.9 fL (ref 80.0–100.0)
Platelets: 268 10*3/uL (ref 150–400)
RBC: 4.36 MIL/uL (ref 3.87–5.11)
RDW: 15.9 % — ABNORMAL HIGH (ref 11.5–15.5)
WBC: 14.7 10*3/uL — ABNORMAL HIGH (ref 4.0–10.5)
nRBC: 0 % (ref 0.0–0.2)

## 2020-03-06 LAB — ANA W/REFLEX IF POSITIVE: Anti Nuclear Antibody (ANA): NEGATIVE

## 2020-03-06 LAB — ANTI-SMITH ANTIBODY: ENA SM Ab Ser-aCnc: <0.2 AI (ref 0.0–0.9)

## 2020-03-06 LAB — ALDOLASE: Aldolase: 29.1 U/L — ABNORMAL HIGH (ref 3.3–10.3)

## 2020-03-06 LAB — SJOGRENS SYNDROME-B EXTRACTABLE NUCLEAR ANTIBODY: SSB (La) (ENA) Antibody, IgG: <0.2 AI (ref 0.0–0.9)

## 2020-03-06 LAB — CK: Total CK: 8898 U/L — ABNORMAL HIGH (ref 38–234)

## 2020-03-06 LAB — PROCALCITONIN: Procalcitonin: 0.11 ng/mL

## 2020-03-06 LAB — ANTI-JO 1 ANTIBODY, IGG: Anti JO-1: <0.2 AI (ref 0.0–0.9)

## 2020-03-06 LAB — ANTI-SCLERODERMA ANTIBODY: Scleroderma (Scl-70) (ENA) Antibody, IgG: <0.2 AI (ref 0.0–0.9)

## 2020-03-06 LAB — SJOGRENS SYNDROME-A EXTRACTABLE NUCLEAR ANTIBODY: SSA (Ro) (ENA) Antibody, IgG: <0.2 AI (ref 0.0–0.9)

## 2020-03-06 LAB — NOVEL CORONAVIRUS, NAA: SARS-CoV-2, NAA: NOT DETECTED

## 2020-03-06 MED ORDER — FLUCONAZOLE 100 MG PO TABS
100.0000 mg | ORAL_TABLET | Freq: Every day | ORAL | Status: DC
  Administered 2020-03-06 – 2020-03-12 (×7): 100 mg via ORAL
  Filled 2020-03-06 (×7): qty 1

## 2020-03-06 MED ORDER — SODIUM CHLORIDE 0.9 % IV BOLUS
1000.0000 mL | Freq: Once | INTRAVENOUS | Status: AC
  Administered 2020-03-06: 1000 mL via INTRAVENOUS

## 2020-03-06 MED ORDER — HYDROCODONE-ACETAMINOPHEN 7.5-325 MG PO TABS
1.0000 | ORAL_TABLET | Freq: Four times a day (QID) | ORAL | Status: DC | PRN
  Administered 2020-03-06 – 2020-03-15 (×18): 1 via ORAL
  Filled 2020-03-06 (×19): qty 1

## 2020-03-06 MED ORDER — SODIUM CHLORIDE 0.9 % IV SOLN: INTRAVENOUS | Status: AC

## 2020-03-06 MED ORDER — METHYLPREDNISOLONE SODIUM SUCC 125 MG IJ SOLR
60.0000 mg | Freq: Two times a day (BID) | INTRAMUSCULAR | Status: DC
  Administered 2020-03-06 – 2020-03-08 (×5): 60 mg via INTRAVENOUS
  Filled 2020-03-06 (×6): qty 2

## 2020-03-06 NOTE — Progress Notes (Signed)
Pt has telemetry orders but pt has not been on tele since arrival to 1536 d/t a skin irritation to ant chest/ breast area she states is r/t electrodes

## 2020-03-06 NOTE — Progress Notes (Signed)
Dressing changed performed  to area of dry desquamation on anterior chest. Area cleansed with NS, patted dry gently with gauze.  Silvadene applied thinly over area, covered with moistened gauze and ABD pad.

## 2020-03-06 NOTE — Plan of Care (Signed)
Covid careplan & education initiated

## 2020-03-06 NOTE — Progress Notes (Signed)
Patient's left arm just below insertion site of midline power port  appears more swollen and tight then right arm. Left arm measures 43 cm, Right arm measures 41 cm.

## 2020-03-06 NOTE — Progress Notes (Signed)
   03/06/20 1810  Assess: MEWS Score  Temp 97.7 F (36.5 C)  BP (!) 145/83  Pulse Rate (!) 117  Resp 18  SpO2 100 %  Assess: MEWS Score  MEWS Temp 0  MEWS Systolic 0  MEWS Pulse 2  MEWS RR 0  MEWS LOC 0  MEWS Score 2  MEWS Score Color Yellow  Assess: if the MEWS score is Yellow or Red  Were vital signs taken at a resting state? Yes  Focused Assessment No change from prior assessment  Early Detection of Sepsis Score *See Row Information* Low  MEWS guidelines implemented *See Row Information* No, previously yellow, continue vital signs every 4 hours  Treat  MEWS Interventions Administered prn meds/treatments  Take Vital Signs  Increase Vital Sign Frequency   (previously yellow q4h VS)  Escalate  MEWS: Escalate  (previously yellow q4h VS)  Notify: Charge Nurse/RN  Name of Charge Nurse/RN Notified Wendy, RN  Date Charge Nurse/RN Notified 03/06/20  Time Charge Nurse/RN Notified 1815  Document  Patient Outcome Other (Comment) (remains in yellow d/t HR)  Progress note created (see row info) Yes   Pt remains in the Yellow MEWS d/t elevated HR of 117. Pt has been previously in the Yellow MEWS. Will continue q4h VS at this time.

## 2020-03-06 NOTE — Progress Notes (Signed)
PHARMACIST - PHYSICIAN COMMUNICATION  CONCERNING: Antibiotic IV to Oral Route Change Policy  RECOMMENDATION: This patient is receiving fluconazole by the intravenous route.  Based on criteria approved by the Pharmacy and Therapeutics Committee, the antibiotic(s) is/are being converted to the equivalent oral dose form(s).   DESCRIPTION: These criteria include:  Patient being treated for a respiratory tract infection, urinary tract infection, cellulitis or clostridium difficile associated diarrhea if on metronidazole  The patient is not neutropenic and does not exhibit a GI malabsorption state  The patient is eating (either orally or via tube) and/or has been taking other orally administered medications for a least 24 hours  The patient is improving clinically and has a Tmax < 100.5  If you have questions about this conversion, please contact the Pharmacy Department  []  ( [phone] )  [place] []  ( [phone] )  [facility] []  ( [phone]  [place] []  ( [phone] )  [facility] [x]  ( [phone] )  [facility]  

## 2020-03-06 NOTE — Progress Notes (Signed)
   03/06/20 1006  Assess: MEWS Score  Temp 98.2 F (36.8 C)  BP 135/66  Pulse Rate (!) 130  Resp 20  Level of Consciousness Alert  SpO2 100 %  Assess: MEWS Score  MEWS Temp 0  MEWS Systolic 0  MEWS Pulse 3  MEWS RR 0  MEWS LOC 0  MEWS Score 3  MEWS Score Color Yellow  Assess: if the MEWS score is Yellow or Red  Were vital signs taken at a resting state? Yes  Focused Assessment No change from prior assessment  Early Detection of Sepsis Score *See Row Information* Low  MEWS guidelines implemented *See Row Information* No, previously yellow, continue vital signs every 4 hours  Treat  MEWS Interventions Administered prn meds/treatments  Take Vital Signs  Increase Vital Sign Frequency   (previously yellow q4h VS)  Escalate  MEWS: Escalate  (previously yellow q4h VS)  Notify: Charge Nurse/RN  Name of Charge Nurse/RN Notified Wendy, RN  Date Charge Nurse/RN Notified 03/06/20  Time Charge Nurse/RN Notified 1010  Document  Patient Outcome Other (Comment) (remains in Yellow d/t HR)   Pt remains in the Yellow MEWS d/t elevated HR of 130. Pt has been previously in the Yellow MEWS. Will continue q4h VS at this time.

## 2020-03-06 NOTE — Progress Notes (Signed)
[facility] PROGRESS NOTE    Progress Note  Haley Sandoval  VOH:[id number] DOB: 03-06-2000 DOA: 03/04/2020 PCP: Barbette Merino, NP     Brief Narrative:   Haley Sandoval is an 20 y.o. female no significant past medical history comes in for her second admission of rhabdomyolysis with worsening body aches bilateral lower and upper extremity she had a CK of 12,000, [facility] rheumatology recommended IV fluids muscle biopsy EMG and no steroids at this time.  Assessment/Plan:   Muscle ache, rash and rhabdomyolysis: Her CK this morning is 88,000, continue aggressive IV fluids. She will need to get a muscle biopsy as an outpatient. She has not kept up with her follow-up appointments with Dr. Dimple Casey. Will start her empirically on steroids at this point in time. Rheumatologic work-up is pending, previous ANA was negative. As noted on 03/04/2020. Urine sodium was less than 10.  Elevated liver enzymes: Likely due to rhabdo which really will improve with IV fluid hydration.  Dysphagia: I do not appreciate any oral thrush on physical exam, we will start her empirically on Diflucan she has been on steroids and antibiotics. We will get a barium swallow.  Hypovolemic hyponatremia Urinary sodium less than 10 continue aggressive IV fluid hydration.  SIRS (systemic inflammatory response syndrome) (HCC) He was not septic on admission, due to inflammatory possibly autoimmune process.  COVID-19 virus infection Noted continue isolation no respiratory symptoms.   DVT prophylaxis: scd Family Communication:none Status is: Inpatient  Remains inpatient appropriate because:Hemodynamically unstable   Dispo: The patient is from: Home              Anticipated d/c is to: Home              Anticipated d/c date is: > 3 days              Patient currently is not medically stable to d/c. Code Status:     Code Status Orders  (From admission, onward)         Start     Ordered   03/05/20 0445   Full code  Continuous        03/05/20 0446        Code Status History    Date Active Date Inactive Code Status Order ID Comments User Context   01/31/2020 0148 02/16/2020 2056 Full Code [id number]  Eduard Clos, MD Inpatient   01/19/2020 0012 01/22/2020 2203 Full Code [id number]  Frankey Shown, DO ED   Advance Care Planning Activity        IV Access:    Peripheral IV   Procedures and diagnostic studies:   DG Chest Port 1 View  Result Date: 03/05/2020 CLINICAL DATA:  COVID-19 EXAM: PORTABLE CHEST 1 VIEW COMPARISON:  01/18/2020 FINDINGS: The heart size and mediastinal contours are within normal limits. Both lungs are clear. The visualized skeletal structures are unremarkable. IMPRESSION: No active disease. Electronically Signed   By: Deatra Robinson M.D.   On: 03/05/2020 03:24     Medical Consultants:    None.  Anti-Infectives:   None  Subjective:    Haley Sandoval now with dysphagia continues to be weak.  Objective:    Vitals:   03/05/20 2224 03/06/20 0209 03/06/20 0418 03/06/20 0618  BP: 134/78 (!) 140/91 127/75 121/70  Pulse: (!) 130 (!) 128 (!) 126 (!) 123  Resp: 20 17 16 16   Temp: 98.1 F (36.7 C) 99.5 F (37.5 C) 99.1 F (37.3 C) 99.2 F (37.3 C)  TempSrc: Oral Oral Oral Oral  SpO2: 100% 100% 100% 100%  Weight:      Height:       SpO2: 100 %   Intake/Output Summary (Last 24 hours) at 03/06/2020 0912 Last data filed at 03/06/2020 0830 Gross per 24 hour  Intake 240 ml  Output --  Net 240 ml   Filed Weights   03/04/20 1513  Weight: 134.7 kg    Exam: General exam: In no acute distress. Respiratory system: Good air movement and clear to auscultation. Cardiovascular system: S1 & S2 heard, RRR. No JVD. Gastrointestinal system: Abdomen is nondistended, soft and nontender.  Extremities: No pedal edema. Skin: No rashes, lesions or ulcers Psychiatry: Judgement and insight appear normal. Mood & affect appropriate.  Data Reviewed:     Labs: Basic Metabolic Panel: Recent Labs  Lab 03/04/20 2349 03/05/20 0525 03/06/20 0434  NA 130* 131* 130*  K 4.9 4.6 4.8  CL 97* 99 102  CO2 20* 20* 18*  GLUCOSE 106* 100* 96  BUN 20 16 16   CREATININE 0.81 0.61 0.64  CALCIUM 9.0 8.7* 8.3*  MG 2.2  --   --    GFR Estimated Creatinine Clearance: 156 mL/min (by C-G formula based on SCr of 0.64 mg/dL). Liver Function Tests: Recent Labs  Lab 03/04/20 2349 03/05/20 0525 03/06/20 0434  AST 281* 243* 212*  ALT 57* 48* 43  ALKPHOS 35* 29* 28*  BILITOT 0.5 0.5 0.4  PROT 7.3 6.3* 5.4*  ALBUMIN 3.5 3.0* 2.4*   Recent Labs  Lab 03/04/20 2349  LIPASE 20   No results for input(s): AMMONIA in the last 168 hours. Coagulation profile No results for input(s): INR, PROTIME in the last 168 hours. COVID-19 Labs  Recent Labs    03/05/20 0524  CRP 5.2*    Lab Results  Component Value Date   SARSCOV2NAA POSITIVE (A) 03/05/2020   SARSCOV2NAA NEGATIVE 01/30/2020   SARSCOV2NAA NEGATIVE 01/18/2020    CBC: Recent Labs  Lab 03/04/20 2348 03/05/20 0525 03/06/20 0434  WBC 15.4* 13.8* 14.7*  HGB 14.4 12.8 12.5  HCT 45.2 39.2 39.2  MCV 89.7 86.9 89.9  PLT 306 312 268   Cardiac Enzymes: Recent Labs  Lab 03/04/20 2348 03/05/20 0525 03/06/20 0434  CKTOTAL 11,713* 9,530* 8,898*   BNP (last 3 results) No results for input(s): PROBNP in the last 8760 hours. CBG: No results for input(s): GLUCAP in the last 168 hours. D-Dimer: No results for input(s): DDIMER in the last 72 hours. Hgb A1c: No results for input(s): HGBA1C in the last 72 hours. Lipid Profile: No results for input(s): CHOL, HDL, LDLCALC, TRIG, CHOLHDL, LDLDIRECT in the last 72 hours. Thyroid function studies: Recent Labs    03/05/20 0530  TSH 1.231   Anemia work up: No results for input(s): VITAMINB12, FOLATE, FERRITIN, TIBC, IRON, RETICCTPCT in the last 72 hours. Sepsis Labs: Recent Labs  Lab 03/04/20 2348 03/05/20 0013 03/05/20 0240  03/05/20 0525 03/06/20 0434  PROCALCITON  --   --   --  0.13 0.11  WBC 15.4*  --   --  13.8* 14.7*  LATICACIDVEN  --  2.8* 2.2*  --   --    Microbiology Recent Results (from the past 240 hour(s))  Resp Panel by RT-PCR (Flu A&B, Covid) Nasopharyngeal Swab     Status: Abnormal   Collection Time: 03/05/20 12:13 AM   Specimen: Nasopharyngeal Swab; Nasopharyngeal(NP) swabs in vial transport medium  Result Value Ref Range Status   SARS Coronavirus 2 by  RT PCR POSITIVE (A) NEGATIVE Final    Comment: RESULT CALLED TO, READ BACK BY AND VERIFIED WITH: SARA, RN @ 0252 ON 03/05/20 C VARNER (NOTE) SARS-CoV-2 target nucleic acids are DETECTED.  The SARS-CoV-2 RNA is generally detectable in upper respiratory specimens during the acute phase of infection. Positive results are indicative of the presence of the identified virus, but do not rule out bacterial infection or co-infection with other pathogens not detected by the test. Clinical correlation with patient history and other diagnostic information is necessary to determine patient infection status. The expected result is Negative.  Fact Sheet for Patients: BloggerCourse.com  Fact Sheet for Healthcare Providers: SeriousBroker.it  This test is not yet approved or cleared by the [place] FDA and  has been authorized for detection and/or diagnosis of SARS-CoV-2 by FDA under an Emergency Use Authorization (EUA).  This EUA will remain in effect (meaning this test can  be used) for the duration of  the COVID-19 declaration under Section 564(b)(1) of the Act, 21 U.S.C. section 360bbb-3(b)(1), unless the authorization is terminated or revoked sooner.     Influenza A by PCR NEGATIVE NEGATIVE Final   Influenza B by PCR NEGATIVE NEGATIVE Final    Comment: (NOTE) The Xpert Xpress SARS-CoV-2/FLU/RSV plus assay is intended as an aid in the diagnosis of influenza from Nasopharyngeal swab  specimens and should not be used as a sole basis for treatment. Nasal washings and aspirates are unacceptable for Xpert Xpress SARS-CoV-2/FLU/RSV testing.  Fact Sheet for Patients: BloggerCourse.com  Fact Sheet for Healthcare Providers: SeriousBroker.it  This test is not yet approved or cleared by the [place] FDA and has been authorized for detection and/or diagnosis of SARS-CoV-2 by FDA under an Emergency Use Authorization (EUA). This EUA will remain in effect (meaning this test can be used) for the duration of the COVID-19 declaration under Section 564(b)(1) of the Act, 21 U.S.C. section 360bbb-3(b)(1), unless the authorization is terminated or revoked.  Performed at [facility], [address], [place], [place] [address]      Medications:   . enoxaparin (LOVENOX) injection  40 mg Subcutaneous Q24H  . silver sulfADIAZINE   Topical BID  . sodium chloride flush  10-40 mL Intracatheter Q12H   Continuous Infusions: . sodium chloride    . fluconazole (DIFLUCAN) IV Stopped (03/05/20 2153)      LOS: 1 day   Marinda Elk  [facility]  03/06/2020, 9:12 AM

## 2020-03-06 NOTE — Progress Notes (Signed)
   03/05/20 2224  Vitals  Temp 98.1 F (36.7 C)  Temp Source Oral  BP 134/78  MAP (mmHg) 93  BP Location Right Arm  BP Method Automatic  Patient Position (if appropriate) Lying  Pulse Rate (!) 130  Resp 20  MEWS COLOR  MEWS Score Color Yellow  Oxygen Therapy  SpO2 100 %  O2 Device Room Air  Patient Activity (if Appropriate) In bed  Pulse Oximetry Type Continuous  Pain Assessment  Pain Scale 0-10  Pain Score 8  Pain Type Acute pain  Pain Location Generalized  Pain Orientation Left;Right  Pain Descriptors / Indicators Aching  Pain Intervention(s) Medication (See eMAR)  MEWS Score  MEWS Temp 0  MEWS Systolic 0  MEWS Pulse 3  MEWS RR 0  MEWS LOC 0  MEWS Score 3  Continue with Yellow MEWS protocol

## 2020-03-06 NOTE — Evaluation (Addendum)
Physical Therapy Evaluation Patient Details Name: Haley Sandoval MRN: [id number] DOB: 17-Dec-2000 Today's Date: 03/06/2020   History of Present Illness  20 yo female admitted with rhabdomyolysis, COVID (+). Hx of pelvic inflammatory disease, R buttock abscess  Clinical Impression  On eval, pt required Mod assist for bed mobility. She sat EOB for ~10 minutes with supervision level assist. HR up to 125 bpm. Pt declined to attempt scooting or standing on today. She reported bil UE, bil LE, and upper chest (wounds) pain. She c/o feeling hot during session. She was tearful when mobilizing. Assisted pt back to bed at end of session. Will plan to follow and progress activity as tolerated. Unsure of d/c plan at this time. Hopefully pt will progress well during hospital stay.     Follow Up Recommendations SNF vs Home health PT (depending on progress)    Equipment Recommendations  Rolling walker with 5" wheels    Recommendations for Other Services       Precautions / Restrictions Precautions Precautions: Fall Precaution Comments: monitor HR Restrictions Weight Bearing Restrictions: No      Mobility  Bed Mobility Overal bed mobility: Needs Assistance Bed Mobility: Rolling;Supine to Sit;Sit to Supine Rolling: Min assist   Supine to sit: Mod assist;HOB elevated Sit to supine: Mod assist;HOB elevated   General bed mobility comments: Increased time and encouragement required. Assist for trunk and bil LEs. Utilized bedpad for scooting, positioning. Sat EOB for ~10 minutes with supv level assist. Pt c/o feeling hot. She declined attempts at scooting or standing so assisted her back to bed.    Transfers                 General transfer comment: NT-pt declined attempts  Ambulation/Gait                Stairs            Wheelchair Mobility    Modified Rankin (Stroke Patients Only)       Balance Overall balance assessment: Needs assistance Sitting-balance  support: Feet supported;Bilateral upper extremity supported Sitting balance-Leahy Scale: Good                                       Pertinent Vitals/Pain Pain Assessment: 0-10 Pain Score: 7  Pain Location: both UEs and both LEs Pain Descriptors / Indicators: Constant Pain Intervention(s): Limited activity within patient's tolerance    Home Living Family/patient expects to be discharged to:: Unsure Living Arrangements:  (was living with mother but she recently passed away)   Type of Home: House Home Access: Stairs to enter Entrance Stairs-Rails: Right Entrance Stairs-Number of Steps: 3 Home Layout: One level Home Equipment: None      Prior Function Level of Independence: Independent         Comments: Tourist information centre manager, drives     Higher education careers adviser        Extremity/Trunk Assessment   Upper Extremity Assessment Upper Extremity Assessment: Generalized weakness    Lower Extremity Assessment Lower Extremity Assessment: Generalized weakness    Cervical / Trunk Assessment Cervical / Trunk Assessment: Normal  Communication   Communication: No difficulties  Cognition Arousal/Alertness: Awake/alert Behavior During Therapy: WFL for tasks assessed/performed Overall Cognitive Status: Within Functional Limits for tasks assessed  General Comments      Exercises General Exercises - Lower Extremity Ankle Circles/Pumps: AROM;Both;5 reps Quad Sets: AROM;Both;5 reps Heel Slides: AAROM;Both;5 reps Straight Leg Raises: AAROM;Both;5 reps   Assessment/Plan    PT Assessment Patient needs continued PT services  PT Problem List Decreased strength;Decreased mobility;Decreased balance;Decreased activity tolerance;Pain       PT Treatment Interventions DME instruction;Gait training;Therapeutic activities;Therapeutic exercise;Patient/family education;Functional mobility training;Balance training    PT  Goals (Current goals can be found in the Care Plan section)  Acute Rehab PT Goals Patient Stated Goal: less pain PT Goal Formulation: With patient Time For Goal Achievement: 03/20/20 Potential to Achieve Goals: Good    Frequency Min 3X/week   Barriers to discharge        Co-evaluation               AM-PAC PT "6 Clicks" Mobility  Outcome Measure Help needed turning from your back to your side while in a flat bed without using bedrails?: A Lot Help needed moving from lying on your back to sitting on the side of a flat bed without using bedrails?: A Lot Help needed moving to and from a bed to a chair (including a wheelchair)?: A Lot Help needed standing up from a chair using your arms (e.g., wheelchair or bedside chair)?: A Lot Help needed to walk in hospital room?: Total Help needed climbing 3-5 steps with a railing? : Total 6 Click Score: 10    End of Session   Activity Tolerance: Patient limited by fatigue;Patient limited by pain Patient left: in bed;with call bell/phone within reach   PT Visit Diagnosis: Muscle weakness (generalized) (M62.81);Pain    Time: 1520-1550 PT Time Calculation (min) (ACUTE ONLY): 30 min   Charges:   PT Evaluation $PT Eval Moderate Complexity: 1 Mod PT Treatments $Therapeutic Activity: 8-22 mins           Faye Ramsay, PT Acute Rehabilitation  Office: [phone] Pager: [phone]

## 2020-03-06 NOTE — Progress Notes (Signed)
   03/06/20 1327  Assess: MEWS Score  Temp 98.6 F (37 C)  BP (!) 141/83  Pulse Rate (!) 119  Resp 20  SpO2 100 %  O2 Device Room Air  Assess: MEWS Score  MEWS Temp 0  MEWS Systolic 0  MEWS Pulse 2  MEWS RR 0  MEWS LOC 0  MEWS Score 2  MEWS Score Color Yellow  Assess: if the MEWS score is Yellow or Red  Were vital signs taken at a resting state? Yes  Focused Assessment No change from prior assessment  Early Detection of Sepsis Score *See Row Information* Low  MEWS guidelines implemented *See Row Information* No, previously yellow, continue vital signs every 4 hours  Treat  MEWS Interventions Administered prn meds/treatments  Take Vital Signs  Increase Vital Sign Frequency   (previously yellow q4h VS)  Escalate  MEWS: Escalate  (previously yellow q4h VS)  Notify: Charge Nurse/RN  Name of Charge Nurse/RN Notified Wendy, RN  Date Charge Nurse/RN Notified 03/06/20  Time Charge Nurse/RN Notified 1330  Document  Patient Outcome  (remains in yellow d/t HR)  Progress note created (see row info) Yes   Pt remains in the Yellow MEWS d/t elevated HR of 119. Pt has been previously in the Yellow MEWS. Will continue q4h VS at this time.

## 2020-03-07 DIAGNOSIS — M6282 Rhabdomyolysis: Secondary | ICD-10-CM | POA: Diagnosis not present

## 2020-03-07 DIAGNOSIS — U071 COVID-19: Secondary | ICD-10-CM | POA: Diagnosis not present

## 2020-03-07 DIAGNOSIS — R748 Abnormal levels of other serum enzymes: Secondary | ICD-10-CM | POA: Diagnosis not present

## 2020-03-07 DIAGNOSIS — E871 Hypo-osmolality and hyponatremia: Secondary | ICD-10-CM | POA: Diagnosis not present

## 2020-03-07 LAB — CBC
HCT: 31.7 % — ABNORMAL LOW (ref 36.0–46.0)
Hemoglobin: 10.2 g/dL — ABNORMAL LOW (ref 12.0–15.0)
MCH: 28.5 pg (ref 26.0–34.0)
MCHC: 32.2 g/dL (ref 30.0–36.0)
MCV: 88.5 fL (ref 80.0–100.0)
Platelets: 245 10*3/uL (ref 150–400)
RBC: 3.58 MIL/uL — ABNORMAL LOW (ref 3.87–5.11)
RDW: 15.2 % (ref 11.5–15.5)
WBC: 11 10*3/uL — ABNORMAL HIGH (ref 4.0–10.5)
nRBC: 0 % (ref 0.0–0.2)

## 2020-03-07 LAB — COMPREHENSIVE METABOLIC PANEL
ALT: 48 U/L — ABNORMAL HIGH (ref 0–44)
AST: 237 U/L — ABNORMAL HIGH (ref 15–41)
Albumin: 2.4 g/dL — ABNORMAL LOW (ref 3.5–5.0)
Alkaline Phosphatase: 25 U/L — ABNORMAL LOW (ref 38–126)
Anion gap: 8 (ref 5–15)
BUN: 14 mg/dL (ref 6–20)
CO2: 22 mmol/L (ref 22–32)
Calcium: 8.5 mg/dL — ABNORMAL LOW (ref 8.9–10.3)
Chloride: 104 mmol/L (ref 98–111)
Creatinine, Ser: 0.51 mg/dL (ref 0.44–1.00)
GFR, Estimated: >60 mL/min (ref >60)
Glucose, Bld: 120 mg/dL — ABNORMAL HIGH (ref 70–99)
Potassium: 4.6 mmol/L (ref 3.5–5.1)
Sodium: 134 mmol/L — ABNORMAL LOW (ref 135–145)
Total Bilirubin: 0.5 mg/dL (ref 0.3–1.2)
Total Protein: 5.3 g/dL — ABNORMAL LOW (ref 6.5–8.1)

## 2020-03-07 LAB — CK: Total CK: 8896 U/L — ABNORMAL HIGH (ref 38–234)

## 2020-03-07 MED ORDER — SODIUM CHLORIDE 0.9 % IV BOLUS
2000.0000 mL | Freq: Once | INTRAVENOUS | Status: AC
  Administered 2020-03-07: 2000 mL via INTRAVENOUS

## 2020-03-07 MED ORDER — SODIUM CHLORIDE 0.9 % IV SOLN: INTRAVENOUS | Status: AC

## 2020-03-07 NOTE — Progress Notes (Signed)
[facility] PROGRESS NOTE    Progress Note  Haley Sandoval  VZC:[id number] DOB: 06-Jul-2000 DOA: 03/04/2020 PCP: Barbette Merino, NP     Brief Narrative:   Haley Sandoval is an 20 y.o. female no significant past medical history comes in for her second admission of rhabdomyolysis with worsening body aches bilateral lower and upper extremity she had a CK of 12,000, [facility] rheumatology recommended IV fluids muscle biopsy EMG and no steroids at this time.  Assessment/Plan:   Muscle ache, rash and rhabdomyolysis: CK this morning is about the same 8800, continue aggressive IV fluid hydration.  We will give her 2 L bolus and continue normal saline. She was started empirically on IV steroids. She has not kept her appointments with Dr. Dimple Casey rheumatologist. She is COVID-positive, surgery was consulted and they recommended outpatient muscle biopsy once resolution of her COVID. UDS positive for cannabis  Elevated liver enzymes: Likely due to rhabdo, see above for further details.  Dysphagia: No oral thrush appreciated on physical exam, she was started empirically on Diflucan. She has been previously treated with steroids and antibiotics, with question thrush. Barium swallow is pending.  Continue soft diet  Hypovolemic hyponatremia Improving with IV fluid hydration, recheck urinary sodium.  SIRS (systemic inflammatory response syndrome) (HCC) He was not septic on admission, due to inflammatory possibly autoimmune process.  COVID-19 virus infection Noted continue isolation no respiratory symptoms.   DVT prophylaxis: scd Family Communication:none Status is: Inpatient  Remains inpatient appropriate because:Hemodynamically unstable   Dispo: The patient is from: Home              Anticipated d/c is to: Home              Anticipated d/c date is: > 3 days              Patient currently is not medically stable to d/c. Code Status:     Code Status Orders  (From admission,  onward)         Start     Ordered   03/05/20 0445  Full code  Continuous        03/05/20 0446        Code Status History    Date Active Date Inactive Code Status Order ID Comments User Context   01/31/2020 0148 02/16/2020 2056 Full Code [id number]  Eduard Clos, MD Inpatient   01/19/2020 0012 01/22/2020 2203 Full Code [id number]  Frankey Shown, DO ED   Advance Care Planning Activity        IV Access:    Peripheral IV   Procedures and diagnostic studies:   No results found.   Medical Consultants:    None.  Anti-Infectives:   None  Subjective:    Haley Sandoval continues to have dysphagia.  Objective:    Vitals:   03/06/20 1810 03/06/20 2023 03/07/20 0458 03/07/20 0500  BP: (!) 145/83 138/79 138/82   Pulse: (!) 117 (!) 114 (!) 109   Resp: 18 (!) 21 20   Temp: 97.7 F (36.5 C) 98.9 F (37.2 C) 98.1 F (36.7 C)   TempSrc:  Oral Oral   SpO2: 100% 100% 100%   Weight:    134.7 kg  Height:    5\' 5"  (1.651 m)   SpO2: 100 %   Intake/Output Summary (Last 24 hours) at 03/07/2020 0756 Last data filed at 03/07/2020 0500 Gross per 24 hour  Intake 3240 ml  Output 1400 ml  Net 1840 ml  Filed Weights   03/04/20 1513 03/07/20 0500  Weight: 134.7 kg 134.7 kg    Exam: General exam: In no acute distress. Respiratory system: Good air movement and clear to auscultation. Cardiovascular system: S1 & S2 heard, RRR. No JVD. Gastrointestinal system: Abdomen is nondistended, soft and nontender.  Extremities: No pedal edema. Skin: No rashes, lesions or ulcers Psychiatry: Judgement and insight appear normal. Mood & affect appropriate.  Data Reviewed:    Labs: Basic Metabolic Panel: Recent Labs  Lab 03/04/20 2349 03/05/20 0525 03/06/20 0434 03/07/20 0333  NA 130* 131* 130* 134*  K 4.9 4.6 4.8 4.6  CL 97* 99 102 104  CO2 20* 20* 18* 22  GLUCOSE 106* 100* 96 120*  BUN 20 16 16 14   CREATININE 0.81 0.61 0.64 0.51  CALCIUM 9.0 8.7* 8.3* 8.5*   MG 2.2  --   --   --    GFR Estimated Creatinine Clearance: 156 mL/min (by C-G formula based on SCr of 0.51 mg/dL). Liver Function Tests: Recent Labs  Lab 03/04/20 2349 03/05/20 0525 03/06/20 0434 03/07/20 0333  AST 281* 243* 212* 237*  ALT 57* 48* 43 48*  ALKPHOS 35* 29* 28* 25*  BILITOT 0.5 0.5 0.4 0.5  PROT 7.3 6.3* 5.4* 5.3*  ALBUMIN 3.5 3.0* 2.4* 2.4*   Recent Labs  Lab 03/04/20 2349  LIPASE 20   No results for input(s): AMMONIA in the last 168 hours. Coagulation profile No results for input(s): INR, PROTIME in the last 168 hours. COVID-19 Labs  Recent Labs    03/05/20 0524  CRP 5.2*    Lab Results  Component Value Date   SARSCOV2NAA POSITIVE (A) 03/05/2020   SARSCOV2NAA Not Detected 03/01/2020   SARSCOV2NAA NEGATIVE 01/30/2020   SARSCOV2NAA NEGATIVE 01/18/2020    CBC: Recent Labs  Lab 03/04/20 2348 03/05/20 0525 03/06/20 0434 03/07/20 0333  WBC 15.4* 13.8* 14.7* 11.0*  HGB 14.4 12.8 12.5 10.2*  HCT 45.2 39.2 39.2 31.7*  MCV 89.7 86.9 89.9 88.5  PLT 306 312 268 245   Cardiac Enzymes: Recent Labs  Lab 03/04/20 2348 03/05/20 0525 03/06/20 0434 03/07/20 0333  CKTOTAL 11,713* 9,530* 03/09/20* 8,896*   BNP (last 3 results) No results for input(s): PROBNP in the last 8760 hours. CBG: No results for input(s): GLUCAP in the last 168 hours. D-Dimer: No results for input(s): DDIMER in the last 72 hours. Hgb A1c: No results for input(s): HGBA1C in the last 72 hours. Lipid Profile: No results for input(s): CHOL, HDL, LDLCALC, TRIG, CHOLHDL, LDLDIRECT in the last 72 hours. Thyroid function studies: Recent Labs    03/05/20 0530  TSH 1.231   Anemia work up: No results for input(s): VITAMINB12, FOLATE, FERRITIN, TIBC, IRON, RETICCTPCT in the last 72 hours. Sepsis Labs: Recent Labs  Lab 03/04/20 2348 03/05/20 0013 03/05/20 0240 03/05/20 0525 03/06/20 0434 03/07/20 0333  PROCALCITON  --   --   --  0.13 0.11  --   WBC 15.4*  --   --  13.8*  14.7* 11.0*  LATICACIDVEN  --  2.8* 2.2*  --   --   --    Microbiology Recent Results (from the past 240 hour(s))  Novel Coronavirus, NAA ([facility])     Status: None   Collection Time: 03/01/20  3:57 PM   Specimen: Nasopharyngeal(NP) swabs in vial transport medium   Nasopharynge  Result Value Ref Range Status   SARS-CoV-2, NAA Not Detected Not Detected Final    Comment: This nucleic acid amplification test was developed  and its performance characteristics determined by [employer]. Nucleic acid amplification tests include RT-PCR and TMA. This test has not been FDA cleared or approved. This test has been authorized by FDA under an Emergency Use Authorization (EUA). This test is only authorized for the duration of time the declaration that circumstances exist justifying the authorization of the emergency use of in vitro diagnostic tests for detection of SARS-CoV-2 virus and/or diagnosis of COVID-19 infection under section 564(b)(1) of the Act, 21 U.S.C. 292KMQ-2(M) (1), unless the authorization is terminated or revoked sooner. When diagnostic testing is negative, the possibility of a false negative result should be considered in the context of a patient's recent exposures and the presence of clinical signs and symptoms consistent with COVID-19. An individual without symptoms of COVID-19 and who is not shedding SARS-CoV-2 virus wo uld expect to have a negative (not detected) result in this assay.   Resp Panel by RT-PCR (Flu A&B, Covid) Nasopharyngeal Swab     Status: Abnormal   Collection Time: 03/05/20 12:13 AM   Specimen: Nasopharyngeal Swab; Nasopharyngeal(NP) swabs in vial transport medium  Result Value Ref Range Status   SARS Coronavirus 2 by RT PCR POSITIVE (A) NEGATIVE Final    Comment: RESULT CALLED TO, READ BACK BY AND VERIFIED WITH: SARA, RN @ 0252 ON 03/05/20 C VARNER (NOTE) SARS-CoV-2 target nucleic acids are DETECTED.  The SARS-CoV-2 RNA is generally detectable  in upper respiratory specimens during the acute phase of infection. Positive results are indicative of the presence of the identified virus, but do not rule out bacterial infection or co-infection with other pathogens not detected by the test. Clinical correlation with patient history and other diagnostic information is necessary to determine patient infection status. The expected result is Negative.  Fact Sheet for Patients: BloggerCourse.com  Fact Sheet for Healthcare Providers: SeriousBroker.it  This test is not yet approved or cleared by the [place] FDA and  has been authorized for detection and/or diagnosis of SARS-CoV-2 by FDA under an Emergency Use Authorization (EUA).  This EUA will remain in effect (meaning this test can  be used) for the duration of  the COVID-19 declaration under Section 564(b)(1) of the Act, 21 U.S.C. section 360bbb-3(b)(1), unless the authorization is terminated or revoked sooner.     Influenza A by PCR NEGATIVE NEGATIVE Final   Influenza B by PCR NEGATIVE NEGATIVE Final    Comment: (NOTE) The Xpert Xpress SARS-CoV-2/FLU/RSV plus assay is intended as an aid in the diagnosis of influenza from Nasopharyngeal swab specimens and should not be used as a sole basis for treatment. Nasal washings and aspirates are unacceptable for Xpert Xpress SARS-CoV-2/FLU/RSV testing.  Fact Sheet for Patients: BloggerCourse.com  Fact Sheet for Healthcare Providers: SeriousBroker.it  This test is not yet approved or cleared by the [place] FDA and has been authorized for detection and/or diagnosis of SARS-CoV-2 by FDA under an Emergency Use Authorization (EUA). This EUA will remain in effect (meaning this test can be used) for the duration of the COVID-19 declaration under Section 564(b)(1) of the Act, 21 U.S.C. section 360bbb-3(b)(1), unless the authorization  is terminated or revoked.  Performed at [facility], [address] [place], [place] [address]      Medications:   . enoxaparin (LOVENOX) injection  40 mg Subcutaneous Q24H  . fluconazole  100 mg Oral QHS  . methylPREDNISolone (SOLU-MEDROL) injection  60 mg Intravenous Q12H  . silver sulfADIAZINE   Topical BID  . sodium chloride flush  10-40  mL Intracatheter Q12H   Continuous Infusions:     LOS: 2 days   Marinda Elkbraham Feliz Ortiz  [facility]  03/07/2020, 7:56 AM

## 2020-03-07 NOTE — Evaluation (Signed)
Occupational Therapy Evaluation Patient Details Name: Haley Sandoval MRN: [id number] DOB: [birth year] Today's Date: 03/07/2020    History of Present Illness 20 yo female admitted with rhabdomyolysis, COVID (+). Hx of pelvic inflammatory disease, R buttock abscess   Clinical Impression   Haley Sandoval is a 20 year old woman admitted with recurrent rhabdomyolysis and positive for COVID-19. On evaluation she presents with generalized weakness, decreased ROM and strength of left upper extremity secondary to pain and edema, decreased activity tolerance, generalized edema, generalized muscle pain but specifically in left arm and bilateral lower extremities with weight bearing. Patient mod assist to transfer to side of bed and able to stand once for approx 5 seconds. Patient unable to take a step and due to pain and edema in LUE had difficulty using the walker. Patient able to tolerate edge of bed sitting able to perform oral care with difficulty reaching her mouth with left hand - but grossly able too. Patient total assistance for LB dressing and toileting, mod-max assist for UB ADLs. Due to deficits above patient predominantly limited to bed. Patient will benefit from skilled OT services while in hospital to improve deficits and learn compensatory strategies as needed in order to improve functional abilities.  Patient is young and would benefit from aggressive therapy to recover functional skills. Difficult to plan discharge due to patient's unknown living situation due to the recent and unexpected death of her mother. Patient also is young and could progress quickly and only need HH services. However, at this time patient exhibits significant debility and decline in functional status thus therapist recommends SNF or CIR. IF patient progress will change POC  Accordingly.    Follow Up Recommendations  CIR;Home health OT;SNF    Equipment Recommendations  Other (comment) (TBD)    Recommendations for  Other Services Rehab consult     Precautions / Restrictions Precautions Precautions: Fall Precaution Comments: monitor HR Restrictions Weight Bearing Restrictions: No      Mobility Bed Mobility Overal bed mobility: Needs Assistance Bed Mobility: Supine to Sit;Sit to Supine     Supine to sit: Mod assist;HOB elevated Sit to supine: Mod assist        Transfers Overall transfer level: Needs assistance Equipment used: None Transfers: Sit to/from Stand Sit to Stand: Min assist         General transfer comment: Able to stand at side of bed with min assist but unable to take a step or stand longer than 5 seconds. Reports instant pain in LEs with weight bearing.    Balance Overall balance assessment: Mild deficits observed, not formally tested                                         ADL either performed or assessed with clinical judgement   ADL Overall ADL's : Needs assistance/impaired Eating/Feeding: Set up   Grooming: Set up;Oral care;Sitting Grooming Details (indicate cue type and reason): Difficulty raising left arm to reach mouth to side from cup to rinse. Performed grooming task at edge of bed Upper Body Bathing: Moderate assistance;Bed level   Lower Body Bathing: Maximal assistance;Bed level   Upper Body Dressing : Maximal assistance;Bed level   Lower Body Dressing: Total assistance;Bed level     Toilet Transfer Details (indicate cue type and reason): unable Toileting- Clothing Manipulation and Hygiene: Total assistance;Bed level  Vision Patient Visual Report: No change from baseline       Perception     Praxis      Pertinent Vitals/Pain Pain Assessment: Faces Faces Pain Scale: Hurts even more Pain Location: BLEs with weight bearing, Left shoulder/arm Pain Descriptors / Indicators: Aching;Grimacing;Guarding;Tiring Pain Intervention(s): Limited activity within patient's tolerance;Monitored during  session;Repositioned     Hand Dominance Right   Extremity/Trunk Assessment Upper Extremity Assessment Upper Extremity Assessment: RUE deficits/detail;LUE deficits/detail RUE Deficits / Details: Active assist to raise shoulder initiallty then grossly functional shoulder ROM ,WFL of elbow, forearm, wrist and fingers. MMT not performed. LUE Deficits / Details: Needed assistance to raise shoulder due to pain and heaviness. Left lower arm swollen. Grossly functional ROM elbow, forearm, wrist and fingers   Lower Extremity Assessment Lower Extremity Assessment: Defer to PT evaluation   Cervical / Trunk Assessment Cervical / Trunk Assessment: Normal   Communication Communication Communication: No difficulties   Cognition Arousal/Alertness: Awake/alert Behavior During Therapy: WFL for tasks assessed/performed Overall Cognitive Status: Within Functional Limits for tasks assessed                                     General Comments       Exercises     Shoulder Instructions      Home Living Family/patient expects to be discharged to:: Unsure                             Home Equipment: None   Additional Comments: Patient's mother died unexpectedly on 01-18-2024and patient reports unsure of living situation on dischage. She thinks where shoe goes will have a limited distance into the house without steps and bedroom with a bathroom really close.      Prior Functioning/Environment Level of Independence: Independent        Comments: Patient reports able to walk a few days after prior hospitilizations before her legs started to get "tight again" and she wasn't getting out of bed.        OT Problem List: Decreased strength;Decreased range of motion;Decreased activity tolerance;Decreased knowledge of use of DME or AE;Pain;Obesity;Increased edema      OT Treatment/Interventions: Self-care/ADL training;Patient/family education;DME and/or AE instruction     OT Goals(Current goals can be found in the care plan section) Acute Rehab OT Goals Patient Stated Goal: TO be ind again OT Goal Formulation: With patient Time For Goal Achievement: 03/21/20 Potential to Achieve Goals: Good  OT Frequency: Min 2X/week   Barriers to D/C:            Co-evaluation              AM-PAC OT "6 Clicks" Daily Activity     Outcome Measure Help from another person eating meals?: A Little Help from another person taking care of personal grooming?: A Little Help from another person toileting, which includes using toliet, bedpan, or urinal?: Total Help from another person bathing (including washing, rinsing, drying)?: A Lot Help from another person to put on and taking off regular upper body clothing?: A Lot Help from another person to put on and taking off regular lower body clothing?: Total 6 Click Score: 12   End of Session Equipment Utilized During Treatment: Rolling walker Nurse Communication: Mobility status  Activity Tolerance: Patient limited by pain Patient left: in bed;with call bell/phone within reach  OT  Visit Diagnosis: Other abnormalities of gait and mobility (R26.89);Muscle weakness (generalized) (M62.81);Pain;Other symptoms and signs involving the nervous system (R29.898)                Time: 1336-1401 OT Time Calculation (min): 25 min Charges:  OT General Charges $OT Visit: 1 Visit OT Evaluation $OT Eval Moderate Complexity: 1 Mod OT Treatments $Self Care/Home Management : 8-22 mins  Kayti Poss, OTR/L Acute Care Rehab Services  Office [phone] Pager: [phone]   Kelli Churn 03/07/2020, 2:19 PM

## 2020-03-07 NOTE — Progress Notes (Signed)
   03/07/20 1336  Assess: MEWS Score  Temp 97.6 F (36.4 C)  BP (!) 144/91  Pulse Rate (!) 117  Resp 19  SpO2 100 %  O2 Device Room Air  Assess: MEWS Score  MEWS Temp 0  MEWS Systolic 0  MEWS Pulse 2  MEWS RR 0  MEWS LOC 0  MEWS Score 2  MEWS Score Color Yellow  Assess: if the MEWS score is Yellow or Red  Were vital signs taken at a resting state? Yes  Focused Assessment No change from prior assessment  Early Detection of Sepsis Score *See Row Information* Low  MEWS guidelines implemented *See Row Information* No, previously yellow, continue vital signs every 4 hours  Take Vital Signs  Increase Vital Sign Frequency   (previously yellow continue q4h VS)  Escalate  MEWS: Escalate  (previously yellow continue q4h VS)  Notify: Charge Nurse/RN  Name of Charge Nurse/RN Notified Wendy, RN  Date Charge Nurse/RN Notified 03/07/20  Time Charge Nurse/RN Notified 1400  Document  Patient Outcome Other (Comment) (pt's HR remains elevated since admission)  Progress note created (see row info) Yes   Pt remains in the Yellow MEWS d/t elevated HR of 117. Pt has been previously in the Yellow MEWS. Will continue q4h VS at this time.

## 2020-03-07 NOTE — Plan of Care (Signed)
  Problem: Education: Goal: Knowledge of General Education information will improve Description: Including pain rating scale, medication(s)/side effects and non-pharmacologic comfort measures Outcome: Progressing   Problem: Activity: Goal: Risk for activity intolerance will decrease Outcome: Progressing   Problem: Nutrition: Goal: Adequate nutrition will be maintained Outcome: Progressing   Problem: Coping: Goal: Level of anxiety will decrease Outcome: Progressing   Problem: Pain Managment: Goal: General experience of comfort will improve Outcome: Progressing   Problem: Coping: Goal: Psychosocial and spiritual needs will be supported Outcome: Progressing

## 2020-03-08 DIAGNOSIS — M6282 Rhabdomyolysis: Secondary | ICD-10-CM | POA: Diagnosis not present

## 2020-03-08 DIAGNOSIS — R748 Abnormal levels of other serum enzymes: Secondary | ICD-10-CM | POA: Diagnosis not present

## 2020-03-08 DIAGNOSIS — U071 COVID-19: Secondary | ICD-10-CM | POA: Diagnosis not present

## 2020-03-08 DIAGNOSIS — E871 Hypo-osmolality and hyponatremia: Secondary | ICD-10-CM | POA: Diagnosis not present

## 2020-03-08 LAB — COMPREHENSIVE METABOLIC PANEL
ALT: 49 U/L — ABNORMAL HIGH (ref 0–44)
AST: 191 U/L — ABNORMAL HIGH (ref 15–41)
Albumin: 2.3 g/dL — ABNORMAL LOW (ref 3.5–5.0)
Alkaline Phosphatase: 26 U/L — ABNORMAL LOW (ref 38–126)
Anion gap: 8 (ref 5–15)
BUN: 16 mg/dL (ref 6–20)
CO2: 22 mmol/L (ref 22–32)
Calcium: 8.5 mg/dL — ABNORMAL LOW (ref 8.9–10.3)
Chloride: 111 mmol/L (ref 98–111)
Creatinine, Ser: 0.47 mg/dL (ref 0.44–1.00)
GFR, Estimated: >60 mL/min (ref >60)
Glucose, Bld: 123 mg/dL — ABNORMAL HIGH (ref 70–99)
Potassium: 4.4 mmol/L (ref 3.5–5.1)
Sodium: 141 mmol/L (ref 135–145)
Total Bilirubin: 0.4 mg/dL (ref 0.3–1.2)
Total Protein: 5.2 g/dL — ABNORMAL LOW (ref 6.5–8.1)

## 2020-03-08 LAB — CBC
HCT: 29.7 % — ABNORMAL LOW (ref 36.0–46.0)
Hemoglobin: 9.6 g/dL — ABNORMAL LOW (ref 12.0–15.0)
MCH: 29.2 pg (ref 26.0–34.0)
MCHC: 32.3 g/dL (ref 30.0–36.0)
MCV: 90.3 fL (ref 80.0–100.0)
Platelets: 246 10*3/uL (ref 150–400)
RBC: 3.29 MIL/uL — ABNORMAL LOW (ref 3.87–5.11)
RDW: 15.6 % — ABNORMAL HIGH (ref 11.5–15.5)
WBC: 13.9 10*3/uL — ABNORMAL HIGH (ref 4.0–10.5)
nRBC: 0 % (ref 0.0–0.2)

## 2020-03-08 LAB — ALDOLASE
Aldolase: 26.1 U/L — ABNORMAL HIGH (ref 3.3–10.3)
Aldolase: 27.5 U/L — ABNORMAL HIGH (ref 3.3–10.3)

## 2020-03-08 LAB — CK: Total CK: 4853 U/L — ABNORMAL HIGH (ref 38–234)

## 2020-03-08 MED ORDER — SODIUM CHLORIDE 0.9 % IV BOLUS
1000.0000 mL | Freq: Once | INTRAVENOUS | Status: AC
  Administered 2020-03-08: 1000 mL via INTRAVENOUS

## 2020-03-08 MED ORDER — PREDNISONE 20 MG PO TABS
50.0000 mg | ORAL_TABLET | Freq: Every day | ORAL | Status: DC
  Administered 2020-03-09 – 2020-03-10 (×2): 50 mg via ORAL
  Filled 2020-03-08 (×2): qty 2

## 2020-03-08 MED ORDER — SODIUM CHLORIDE 0.9 % IV SOLN: INTRAVENOUS | Status: DC

## 2020-03-08 NOTE — Plan of Care (Signed)
  Problem: Education: Goal: Knowledge of General Education information will improve Description: Including pain rating scale, medication(s)/side effects and non-pharmacologic comfort measures Outcome: Progressing   Problem: Activity: Goal: Risk for activity intolerance will decrease Outcome: Progressing   Problem: Nutrition: Goal: Adequate nutrition will be maintained Outcome: Progressing   Problem: Coping: Goal: Level of anxiety will decrease Outcome: Progressing   Problem: Respiratory: Goal: Will maintain a patent airway Outcome: Progressing

## 2020-03-08 NOTE — TOC Initial Note (Signed)
Transition of Care [facility]) - Initial/Assessment Note    Patient Details  Name: Haley Sandoval MRN: [id number] Date of Birth: 2000/05/16  [facility] CM/SW Contact:    Ida Rogue, LCSW Phone Number: 03/08/2020, 2:50 PM  Clinical Narrative:  Patient seen in follow up to PT/OT recommendation of CIR/SNF/HH.  Haley Sandoval tells me her plan is to return home from the hospital.  When I clarified about whether that was here or in [place], she told me her mother had just recently died, and that she is living in [place] off of [address]  She is open to a referral for outpatient PT, and feels like she is able to change her own dressings and does not need any kind of wound care.  She states she does help, but did not tell me who that is. TOC will continue to follow during the course of hospitalization.                Expected Discharge Plan: Home/Self Care Barriers to Discharge: No Barriers Identified   Patient Goals and CMS Choice Patient states their goals for this hospitalization and ongoing recovery are:: Go back home      Expected Discharge Plan and Services Expected Discharge Plan: Home/Self Care   Discharge Planning Services: CM Consult   Living arrangements for the past 2 months: Apartment Expected Discharge Date:  (unknown)                                    Prior Living Arrangements/Services Living arrangements for the past 2 months: Apartment Lives with:: Self Patient language and need for interpreter reviewed:: Yes        Need for Family Participation in Patient Care: Yes (Comment) Care giver support system in place?: Yes (comment)   Criminal Activity/Legal Involvement Pertinent to Current Situation/Hospitalization: No - Comment as needed  Activities of Daily Living Home Assistive Devices/Equipment: Other (Comment) (tub/shower unit, standard toilet) ADL Screening (condition at time of admission) Patient's cognitive ability adequate to safely  complete daily activities?: Yes Is the patient deaf or have difficulty hearing?: No Does the patient have difficulty seeing, even when wearing glasses/contacts?: No Does the patient have difficulty concentrating, remembering, or making decisions?: No Patient able to express need for assistance with ADLs?: Yes Does the patient have difficulty dressing or bathing?: No Independently performs ADLs?: Yes (appropriate for developmental age) Does the patient have difficulty walking or climbing stairs?: Yes (secondary to weakness) Weakness of Legs: Both Weakness of Arms/Hands: None  Permission Sought/Granted                  Emotional Assessment Appearance:: Appears stated age Attitude/Demeanor/Rapport: Engaged Affect (typically observed): Appropriate Orientation: : Oriented to Self,Oriented to Place,Oriented to  Time,Oriented to Situation Alcohol / Substance Use: Not Applicable Psych Involvement: No (comment)  Admission diagnosis:  Myalgia [M79.10] 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 Symptomatic inflammatory myopathy 03/05/2020  . Rash 03/05/2020  . SIRS (systemic inflammatory response syndrome) (HCC) 03/05/2020  . COVID-19 virus infection 03/05/2020  . Myalgia   . Sepsis (HCC) 01/31/2020  . Morbidly obese (HCC) 01/31/2020  . Cellulitis 01/30/2020  . Atypical pigmented skin lesion 01/29/2020  . Non-intractable vomiting   . Constipation   . Hepatitis   . Rhabdomyolysis 01/18/2020  . Elevated liver enzymes 01/18/2020  .  Acute febrile illness 01/18/2020  . Hypoalbuminemia 01/18/2020  . Hyponatremia 01/18/2020  . Obesity, Class III, BMI 40-49.9 (morbid obesity) (HCC) 01/18/2020   PCP:  Barbette Merino, NP Pharmacy:   [phone] [place], [place] - [address] [place] [address] Phone:  [phone] Fax: [phone]     Social Determinants of Health (SDOH) Interventions    Readmission Risk Interventions Readmission Risk Prevention Plan 02/04/2020  Transportation Screening Complete  PCP or Specialist Appt within 5-7 Days Complete  Home Care Screening Complete  Medication Review (RN CM) Complete  Some recent data might be hidden

## 2020-03-08 NOTE — Plan of Care (Signed)
  Problem: Pain Managment: Goal: General experience of comfort will improve Outcome: Progressing   Problem: Skin Integrity: Goal: Risk for impaired skin integrity will decrease Outcome: Progressing   Problem: Education: Goal: Knowledge of risk factors and measures for prevention of condition will improve Outcome: Progressing   Problem: Coping: Goal: Psychosocial and spiritual needs will be supported Outcome: Progressing   Problem: Respiratory: Goal: Will maintain a patent airway Outcome: Progressing Goal: Complications related to the disease process, condition or treatment will be avoided or minimized Outcome: Progressing   Problem: Activity: Goal: Risk for activity intolerance will decrease Outcome: Not Progressing   Problem: Nutrition: Goal: Adequate nutrition will be maintained Outcome: Not Progressing   Problem: Coping: Goal: Level of anxiety will decrease Outcome: Not Progressing

## 2020-03-08 NOTE — Progress Notes (Signed)
[facility] PROGRESS NOTE    Progress Note  Haley GarinSamone J Brasher  [id number] DOB: 2001-02-12 DOA: 03/04/2020 PCP: Barbette MerinoKing, Crystal M, NP     Brief Narrative:   Haley Sandoval is an 20 y.o. female no significant past medical history comes in for her second admission of rhabdomyolysis with worsening body aches bilateral lower and upper extremity she had a CK [facility] 12,000[facility] fluids muscle biopsy EMG and no steroids at this time.  Assessment/Plan:   Muscle ache, rash and rhabdomyolysis: Her CK is trending down and is 5000 this morning we will continue IV aggressive IV fluid hydration. Transition IV steroids to orals. She has not kept her follow-up appointments with Dr. Dimple Caseyice rheumatology, her muscle biopsy cannot be done due to her COVID-19 positivity surgery was consulted and they recommended to schedule it as an outpatient with her rheumatologist. UDS positive for cannabis. She does not appear fluid overloaded on physical exam.  Elevated liver enzymes: Likely due to rhabdo, see above for further details.  Dysphagia: No oral thrush appreciated on physical exam, daily oral Diflucan. She has been previously treated with steroids and antibiotics, will put her at high risk of esophageal candidiasis Barium swallow is pending.  Continue soft diet, continue Ensure.  Hypovolemic hyponatremia Sodium has improved with IV fluid hydration recheck urinary sodium.  SIRS (systemic inflammatory response syndrome) (HCC) He was not septic on admission, due to inflammatory possibly autoimmune process.  COVID-19 virus infection Noted continue isolation no respiratory symptoms.   DVT prophylaxis: scd Family Communication:none Status is: Inpatient  Remains inpatient appropriate because:Hemodynamically unstable   Dispo: The patient is from: Home              Anticipated d/c is to: Home              Anticipated d/c date is: > 3 days              Patient currently  is not medically stable to d/c. Code Status:     Code Status Orders  (From admission, onward)         Start     Ordered   03/05/20 0445  Full code  Continuous        03/05/20 0446        Code Status History    Date Active Date Inactive Code Status Order ID Comments User Context   01/31/2020 0148 02/16/2020 2056 Full Code [id number]  Eduard ClosKakrakandy, Arshad N, MD Inpatient   01/19/2020 0012 01/22/2020 2203 Full Code [id number]  Frankey ShownAdefeso, Oladapo, DO ED   Advance Care Planning Activity        IV Access:    Peripheral IV   Procedures and diagnostic studies:   No results found.   Medical Consultants:    None.  Anti-Infectives:   None  Subjective:    Haley GarinSamone J Sweney continues to complain of dysphagia.  Objective:    Vitals:   03/08/20 0017 03/08/20 0416 03/08/20 0420 03/08/20 0727  BP: (!) 120/54 (!) 142/65  (!) 101/46  Pulse: (!) 108 (!) 107  (!) 110  Resp: 17 16  (!) 22  Temp: 98.2 F (36.8 C) 97.9 F (36.6 C)  98.7 F (37.1 C)  TempSrc: Oral Oral  Oral  SpO2: 100% 100%  99%  Weight:   135 kg   Height:       SpO2: 99 %   Intake/Output Summary (Last 24 hours) at 03/08/2020 0934 Last data filed at 03/08/2020 0500  Gross per 24 hour  Intake 5780.73 ml  Output 2050 ml  Net 3730.73 ml   Filed Weights   03/04/20 1513 03/07/20 0500 03/08/20 0420  Weight: 134.7 kg 134.7 kg 135 kg    Exam: General exam: In no acute distress. Respiratory system: Good air movement and clear to auscultation. Cardiovascular system: S1 & S2 heard, RRR. No JVD. Gastrointestinal system: Abdomen is nondistended, soft and nontender.  Extremities: No pedal edema. Skin: No rashes, lesions or ulcers Psychiatry: Judgement and insight appear normal. Mood & affect appropriate.  Data Reviewed:    Labs: Basic Metabolic Panel: Recent Labs  Lab 03/04/20 2349 03/05/20 0525 03/06/20 0434 03/07/20 0333 03/08/20 0339  NA 130* 131* 130* 134* 141  K 4.9 4.6 4.8 4.6 4.4  CL 97*  99 102 104 111  CO2 20* 20* 18* 22 22  GLUCOSE 106* 100* 96 120* 123*  BUN 20 16 16 14 16   CREATININE 0.81 0.61 0.64 0.51 0.47  CALCIUM 9.0 8.7* 8.3* 8.5* 8.5*  MG 2.2  --   --   --   --    GFR Estimated Creatinine Clearance: 156.2 mL/min (by C-G formula based on SCr of 0.47 mg/dL). Liver Function Tests: Recent Labs  Lab 03/04/20 2349 03/05/20 0525 03/06/20 0434 03/07/20 0333 03/08/20 0339  AST 281* 243* 212* 237* 191*  ALT 57* 48* 43 48* 49*  ALKPHOS 35* 29* 28* 25* 26*  BILITOT 0.5 0.5 0.4 0.5 0.4  PROT 7.3 6.3* 5.4* 5.3* 5.2*  ALBUMIN 3.5 3.0* 2.4* 2.4* 2.3*   Recent Labs  Lab 03/04/20 2349  LIPASE 20   No results for input(s): AMMONIA in the last 168 hours. Coagulation profile No results for input(s): INR, PROTIME in the last 168 hours. COVID-19 Labs  No results for input(s): DDIMER, FERRITIN, LDH, CRP in the last 72 hours.  Lab Results  Component Value Date   SARSCOV2NAA POSITIVE (A) 03/05/2020   SARSCOV2NAA Not Detected 03/01/2020   SARSCOV2NAA NEGATIVE 01/30/2020   SARSCOV2NAA NEGATIVE 01/18/2020    CBC: Recent Labs  Lab 03/04/20 2348 03/05/20 0525 03/06/20 0434 03/07/20 0333 03/08/20 0339  WBC 15.4* 13.8* 14.7* 11.0* 13.9*  HGB 14.4 12.8 12.5 10.2* 9.6*  HCT 45.2 39.2 39.2 31.7* 29.7*  MCV 89.7 86.9 89.9 88.5 90.3  PLT 306 312 268 245 246   Cardiac Enzymes: Recent Labs  Lab 03/04/20 2348 03/05/20 0525 03/06/20 0434 03/07/20 0333 03/08/20 0339  CKTOTAL 11,713* 9,530* 03/10/20* 8,896* 4,853*   BNP (last 3 results) No results for input(s): PROBNP in the last 8760 hours. CBG: No results for input(s): GLUCAP in the last 168 hours. D-Dimer: No results for input(s): DDIMER in the last 72 hours. Hgb A1c: No results for input(s): HGBA1C in the last 72 hours. Lipid Profile: No results for input(s): CHOL, HDL, LDLCALC, TRIG, CHOLHDL, LDLDIRECT in the last 72 hours. Thyroid function studies: No results for input(s): TSH, T4TOTAL, T3FREE,  THYROIDAB in the last 72 hours.  Invalid input(s): FREET3 Anemia work up: No results for input(s): VITAMINB12, FOLATE, FERRITIN, TIBC, IRON, RETICCTPCT in the last 72 hours. Sepsis Labs: Recent Labs  Lab 03/05/20 0013 03/05/20 0240 03/05/20 0525 03/06/20 0434 03/07/20 0333 03/08/20 0339  PROCALCITON  --   --  0.13 0.11  --   --   WBC  --   --  13.8* 14.7* 11.0* 13.9*  LATICACIDVEN 2.8* 2.2*  --   --   --   --    Microbiology Recent Results (from the past  240 hour(s))  Novel Coronavirus, NAA ([facility])     Status: None   Collection Time: 03/01/20  3:57 PM   Specimen: Nasopharyngeal(NP) swabs in vial transport medium   Nasopharynge  Result Value Ref Range Status   SARS-CoV-2, NAA Not Detected Not Detected Final    Comment: This nucleic acid amplification test was developed and its performance characteristics determined by [employer]. Nucleic acid amplification tests include RT-PCR and TMA. This test has not been FDA cleared or approved. This test has been authorized by FDA under an Emergency Use Authorization (EUA). This test is only authorized for the duration of time the declaration that circumstances exist justifying the authorization of the emergency use of in vitro diagnostic tests for detection of SARS-CoV-2 virus and/or diagnosis of COVID-19 infection under section 564(b)(1) of the Act, 21 U.S.C. 841YSA-6(T) (1), unless the authorization is terminated or revoked sooner. When diagnostic testing is negative, the possibility of a false negative result should be considered in the context of a patient's recent exposures and the presence of clinical signs and symptoms consistent with COVID-19. An individual without symptoms of COVID-19 and who is not shedding SARS-CoV-2 virus wo uld expect to have a negative (not detected) result in this assay.   Resp Panel by RT-PCR (Flu A&B, Covid) Nasopharyngeal Swab     Status: Abnormal   Collection Time: 03/05/20 12:13 AM    Specimen: Nasopharyngeal Swab; Nasopharyngeal(NP) swabs in vial transport medium  Result Value Ref Range Status   SARS Coronavirus 2 by RT PCR POSITIVE (A) NEGATIVE Final    Comment: RESULT CALLED TO, READ BACK BY AND VERIFIED WITH: SARA, RN @ 0252 ON 03/05/20 C VARNER (NOTE) SARS-CoV-2 target nucleic acids are DETECTED.  The SARS-CoV-2 RNA is generally detectable in upper respiratory specimens during the acute phase of infection. Positive results are indicative of the presence of the identified virus, but do not rule out bacterial infection or co-infection with other pathogens not detected by the test. Clinical correlation with patient history and other diagnostic information is necessary to determine patient infection status. The expected result is Negative.  Fact Sheet for Patients: BloggerCourse.com  Fact Sheet for Healthcare Providers: SeriousBroker.it  This test is not yet approved or cleared by the [place] FDA and  has been authorized for detection and/or diagnosis of SARS-CoV-2 by FDA under an Emergency Use Authorization (EUA).  This EUA will remain in effect (meaning this test can  be used) for the duration of  the COVID-19 declaration under Section 564(b)(1) of the Act, 21 U.S.C. section 360bbb-3(b)(1), unless the authorization is terminated or revoked sooner.     Influenza A by PCR NEGATIVE NEGATIVE Final   Influenza B by PCR NEGATIVE NEGATIVE Final    Comment: (NOTE) The Xpert Xpress SARS-CoV-2/FLU/RSV plus assay is intended as an aid in the diagnosis of influenza from Nasopharyngeal swab specimens and should not be used as a sole basis for treatment. Nasal washings and aspirates are unacceptable for Xpert Xpress SARS-CoV-2/FLU/RSV testing.  Fact Sheet for Patients: BloggerCourse.com  Fact Sheet for Healthcare Providers: SeriousBroker.it  This test is not  yet approved or cleared by the [place] FDA and has been authorized for detection and/or diagnosis of SARS-CoV-2 by FDA under an Emergency Use Authorization (EUA). This EUA will remain in effect (meaning this test can be used) for the duration of the COVID-19 declaration under Section 564(b)(1) of the Act, 21 U.S.C. section 360bbb-3(b)(1), unless the authorization is terminated or revoked.  Performed at [facility], [address], [place], [place] [address]      Medications:   . fluconazole  100 mg Oral QHS  . methylPREDNISolone (SOLU-MEDROL) injection  60 mg Intravenous Q12H  . silver sulfADIAZINE   Topical BID  . sodium chloride flush  10-40 mL Intracatheter Q12H   Continuous Infusions:     LOS: 3 days   Marinda Elk  [facility]  03/08/2020, 9:34 AM

## 2020-03-08 NOTE — Progress Notes (Signed)
Physical Therapy Treatment Patient Details Name: Haley Sandoval MRN: [id number] DOB: [birth year] Today's Date: 03/08/2020    History of Present Illness 20 yo female admitted with rhabdomyolysis, COVID (+). Hx of pelvic inflammatory disease, R buttock abscess    PT Comments    Pt in bed on person phone.  Attempted OOB was unsuccessful. General bed mobility comments: Increased time and encouragement required. Assist for trunk and bil LEs. Utilized bedpad for scooting, positioning. Sat EOB for ~10 minutes with supv level assist. Attempted to scoot but appears minimal effort.  Attempted sit to stand but no effort.  Attempted scooting to drop arm recliner but pt no effort.  "I can't.....Marland Kitchenmy legs are too big".  Follow Up Recommendations  SNF;Home health PT (pending progress)     Equipment Recommendations  Rolling walker with 5" wheels    Recommendations for Other Services       Precautions / Restrictions Precautions Precautions: Fall    Mobility  Bed Mobility Overal bed mobility: Needs Assistance Bed Mobility: Supine to Sit;Sit to Supine Rolling: Min assist   Supine to sit: Mod assist;HOB elevated Sit to supine: Mod assist   General bed mobility comments: Increased time and encouragement required. Assist for trunk and bil LEs. Utilized bedpad for scooting, positioning. Sat EOB for ~10 minutes with supv level assist. Attempted to scoot but appears minimal effort.  Attempted sit to stand but no effort.  Attempted scooting to drop arm recliner but pt no effort.  "I can't.....Marland Kitchenmy legs are too big".  Transfers                 General transfer comment: not able  Ambulation/Gait                 Stairs             Wheelchair Mobility    Modified Rankin (Stroke Patients Only)       Balance                                            Cognition Arousal/Alertness: Awake/alert Behavior During Therapy: WFL for tasks  assessed/performed Overall Cognitive Status: Within Functional Limits for tasks assessed                                        Exercises      General Comments        Pertinent Vitals/Pain Pain Assessment: Faces Pain Location: B LE c/o MAX edenamous Pain Descriptors / Indicators: Aching;Grimacing;Guarding;Tiring Pain Intervention(s): Monitored during session;Repositioned    Home Living                      Prior Function            PT Goals (current goals can now be found in the care plan section) Progress towards PT goals: Progressing toward goals    Frequency    Min 3X/week      PT Plan Current plan remains appropriate    Co-evaluation              AM-PAC PT "6 Clicks" Mobility   Outcome Measure  Help needed turning from your back to your side while in a flat bed without using bedrails?: A Lot Help needed moving from lying  on your back to sitting on the side of a flat bed without using bedrails?: A Lot Help needed moving to and from a bed to a chair (including a wheelchair)?: A Lot Help needed standing up from a chair using your arms (e.g., wheelchair or bedside chair)?: A Lot Help needed to walk in hospital room?: Total Help needed climbing 3-5 steps with a railing? : Total 6 Click Score: 10    End of Session Equipment Utilized During Treatment: Gait belt Activity Tolerance: Patient limited by fatigue;Patient limited by pain Patient left: in bed;with call bell/phone within reach Nurse Communication: Mobility status PT Visit Diagnosis: Muscle weakness (generalized) (M62.81);Pain     Time: 6578-4696 PT Time Calculation (min) (ACUTE ONLY): 30 min  Charges:  $Therapeutic Activity: 23-37 mins                     Felecia Shelling  PTA Acute  Rehabilitation Services Pager      [phone] Office      [phone]

## 2020-03-09 DIAGNOSIS — M6282 Rhabdomyolysis: Secondary | ICD-10-CM | POA: Diagnosis not present

## 2020-03-09 DIAGNOSIS — R748 Abnormal levels of other serum enzymes: Secondary | ICD-10-CM | POA: Diagnosis not present

## 2020-03-09 DIAGNOSIS — M791 Myalgia, unspecified site: Secondary | ICD-10-CM | POA: Diagnosis not present

## 2020-03-09 DIAGNOSIS — U071 COVID-19: Secondary | ICD-10-CM | POA: Diagnosis not present

## 2020-03-09 LAB — BASIC METABOLIC PANEL
Anion gap: 7 (ref 5–15)
BUN: 20 mg/dL (ref 6–20)
CO2: 21 mmol/L — ABNORMAL LOW (ref 22–32)
Calcium: 8.3 mg/dL — ABNORMAL LOW (ref 8.9–10.3)
Chloride: 113 mmol/L — ABNORMAL HIGH (ref 98–111)
Creatinine, Ser: 0.48 mg/dL (ref 0.44–1.00)
GFR, Estimated: >60 mL/min (ref >60)
Glucose, Bld: 108 mg/dL — ABNORMAL HIGH (ref 70–99)
Potassium: 4.1 mmol/L (ref 3.5–5.1)
Sodium: 141 mmol/L (ref 135–145)

## 2020-03-09 LAB — CBC
HCT: 29.8 % — ABNORMAL LOW (ref 36.0–46.0)
Hemoglobin: 9.4 g/dL — ABNORMAL LOW (ref 12.0–15.0)
MCH: 28.8 pg (ref 26.0–34.0)
MCHC: 31.5 g/dL (ref 30.0–36.0)
MCV: 91.4 fL (ref 80.0–100.0)
Platelets: 275 10*3/uL (ref 150–400)
RBC: 3.26 MIL/uL — ABNORMAL LOW (ref 3.87–5.11)
RDW: 16.1 % — ABNORMAL HIGH (ref 11.5–15.5)
WBC: 15.5 10*3/uL — ABNORMAL HIGH (ref 4.0–10.5)
nRBC: 0 % (ref 0.0–0.2)

## 2020-03-09 LAB — CK: Total CK: 2880 U/L — ABNORMAL HIGH (ref 38–234)

## 2020-03-09 MED ORDER — FUROSEMIDE 10 MG/ML IJ SOLN
40.0000 mg | Freq: Once | INTRAMUSCULAR | Status: AC
  Administered 2020-03-09: 40 mg via INTRAVENOUS
  Filled 2020-03-09: qty 4

## 2020-03-09 MED ORDER — SODIUM CHLORIDE 0.9 % IV SOLN: INTRAVENOUS | Status: AC

## 2020-03-09 MED ORDER — SODIUM CHLORIDE 0.9 % IV BOLUS: 2000.0000 mL | Freq: Once | INTRAVENOUS | Status: DC

## 2020-03-09 NOTE — Progress Notes (Signed)
OT Cancellation Note  Patient Details Name: Haley Sandoval MRN: [id number] DOB: 05/05/2000   Cancelled Treatment:    Reason Eval/Treat Not Completed: Pain limiting ability to participate. Patient reports she hasn't had her pain medication today, her mom recently passed away "and I'm just not in the mood for this today." Provided encouragement and will re-attempt 1/19 as schedule permits.   Marlyce Huge OT OT pager: [phone]   Carmelia Roller 03/09/2020, 2:46 PM

## 2020-03-09 NOTE — Progress Notes (Signed)
[facility] PROGRESS NOTE    Progress Note  Haley Sandoval  YHC:[id number] DOB: 2000-03-02 DOA: 03/04/2020 PCP: Barbette Merino, NP     Brief Narrative:   Haley Sandoval is an 20 y.o. female no significant past medical history comes in for her second admission of rhabdomyolysis with worsening body aches bilateral lower and upper extremity she had a CK of 12,000, [facility] rheumatology recommended IV fluids muscle biopsy EMG and no steroids at this time.  Assessment/Plan:   Muscle ache, rash and rhabdomyolysis: CK is trending down this morning is 2800 we will continue aggressive IV fluid hydration. Continue oral steroids. She has not kept her appointments with Dr. Dimple Casey. Her muscle biopsy could not be done in house due to her COVID positivity this could be done as an outpatient. UDS was positive for cannabis. She does appear to be slightly fluid overloaded on physical exam she keeps relating she is swelling that that her thigh bothered her due to the swelling.  But I believe most of the is she is overweight. We will continue IV fluids for an additional 24 hours if her CK continues to be below her can be DC.  Elevated liver enzymes: Likely due to rhabdo, see above for further details.  Dysphagia: No oral thrush appreciated on physical exam, daily oral Diflucan. She has been previously treated with steroids and antibiotics, will put her at high risk of esophageal candidiasis Barium swallow continues to be pending.  Continue soft diet, continue Ensure.  Hypovolemic hyponatremia Sodium has improved with IV fluid hydration recheck urinary sodium.  SIRS (systemic inflammatory response syndrome) (HCC) He was not septic on admission, due to inflammatory possibly autoimmune process.  COVID-19 virus infection Noted continue isolation no respiratory symptoms.   DVT prophylaxis: scd Family Communication:none Status is: Inpatient  Remains inpatient appropriate  because:Hemodynamically unstable   Dispo: The patient is from: Home              Anticipated d/c is to: Home              Anticipated d/c date is: > 3 days              Patient currently is not medically stable to d/c. Code Status:     Code Status Orders  (From admission, onward)         Start     Ordered   03/05/20 0445  Full code  Continuous        03/05/20 0446        Code Status History    Date Active Date Inactive Code Status Order ID Comments User Context   01/31/2020 0148 02/16/2020 2056 Full Code [id number]  Eduard Clos, MD Inpatient   01/19/2020 0012 01/22/2020 2203 Full Code [id number]  Frankey Shown, DO ED   Advance Care Planning Activity        IV Access:    Peripheral IV   Procedures and diagnostic studies:   No results found.   Medical Consultants:    None.  Anti-Infectives:   None  Subjective:    Haley Sandoval continues to complain of dysphagia.  Objective:    Vitals:   03/08/20 2122 03/09/20 0208 03/09/20 0500 03/09/20 0559  BP: 136/62 127/65  112/66  Pulse: (!) 113 (!) 110  (!) 105  Resp: 20 20  20   Temp: 98.9 F (37.2 C) 97.8 F (36.6 C)  98.7 F (37.1 C)  TempSrc: Oral Oral  Oral  SpO2: 100%  100%  100%  Weight:   (!) 141 kg   Height:       SpO2: 100 %   Intake/Output Summary (Last 24 hours) at 03/09/2020 0941 Last data filed at 03/09/2020 0601 Gross per 24 hour  Intake 2197.94 ml  Output 1550 ml  Net 647.94 ml   Filed Weights   03/07/20 0500 03/08/20 0420 03/09/20 0500  Weight: 134.7 kg 135 kg (!) 141 kg    Exam: General exam: In no acute distress. Respiratory system: Good air movement and clear to auscultation. Cardiovascular system: S1 & S2 heard, RRR. No JVD. Gastrointestinal system: Abdomen is nondistended, soft and nontender.  Extremities: No pedal edema. Skin: No rashes, lesions or ulcers Psychiatry: Judgement and insight appear normal. Mood & affect appropriate.  Data Reviewed:     Labs: Basic Metabolic Panel: Recent Labs  Lab 03/04/20 2349 03/05/20 0525 03/06/20 0434 03/07/20 0333 03/08/20 0339 03/09/20 0343  NA 130* 131* 130* 134* 141 141  K 4.9 4.6 4.8 4.6 4.4 4.1  CL 97* 99 102 104 111 113*  CO2 20* 20* 18* 22 22 21*  GLUCOSE 106* 100* 96 120* 123* 108*  BUN 20 16 16 14 16 20   CREATININE 0.81 0.61 0.64 0.51 0.47 0.48  CALCIUM 9.0 8.7* 8.3* 8.5* 8.5* 8.3*  MG 2.2  --   --   --   --   --    GFR Estimated Creatinine Clearance: 160.4 mL/min (by C-G formula based on SCr of 0.48 mg/dL). Liver Function Tests: Recent Labs  Lab 03/04/20 2349 03/05/20 0525 03/06/20 0434 03/07/20 0333 03/08/20 0339  AST 281* 243* 212* 237* 191*  ALT 57* 48* 43 48* 49*  ALKPHOS 35* 29* 28* 25* 26*  BILITOT 0.5 0.5 0.4 0.5 0.4  PROT 7.3 6.3* 5.4* 5.3* 5.2*  ALBUMIN 3.5 3.0* 2.4* 2.4* 2.3*   Recent Labs  Lab 03/04/20 2349  LIPASE 20   No results for input(s): AMMONIA in the last 168 hours. Coagulation profile No results for input(s): INR, PROTIME in the last 168 hours. COVID-19 Labs  No results for input(s): DDIMER, FERRITIN, LDH, CRP in the last 72 hours.  Lab Results  Component Value Date   SARSCOV2NAA POSITIVE (A) 03/05/2020   SARSCOV2NAA Not Detected 03/01/2020   SARSCOV2NAA NEGATIVE 01/30/2020   SARSCOV2NAA NEGATIVE 01/18/2020    CBC: Recent Labs  Lab 03/05/20 0525 03/06/20 0434 03/07/20 0333 03/08/20 0339 03/09/20 0343  WBC 13.8* 14.7* 11.0* 13.9* 15.5*  HGB 12.8 12.5 10.2* 9.6* 9.4*  HCT 39.2 39.2 31.7* 29.7* 29.8*  MCV 86.9 89.9 88.5 90.3 91.4  PLT 312 268 245 246 275   Cardiac Enzymes: Recent Labs  Lab 03/05/20 0525 03/06/20 0434 03/07/20 0333 03/08/20 0339 03/09/20 0343  CKTOTAL 9,530* 7,8298,898* 8,896* 4,853* 2,880*   BNP (last 3 results) No results for input(s): PROBNP in the last 8760 hours. CBG: No results for input(s): GLUCAP in the last 168 hours. D-Dimer: No results for input(s): DDIMER in the last 72 hours. Hgb  A1c: No results for input(s): HGBA1C in the last 72 hours. Lipid Profile: No results for input(s): CHOL, HDL, LDLCALC, TRIG, CHOLHDL, LDLDIRECT in the last 72 hours. Thyroid function studies: No results for input(s): TSH, T4TOTAL, T3FREE, THYROIDAB in the last 72 hours.  Invalid input(s): FREET3 Anemia work up: No results for input(s): VITAMINB12, FOLATE, FERRITIN, TIBC, IRON, RETICCTPCT in the last 72 hours. Sepsis Labs: Recent Labs  Lab 03/05/20 0013 03/05/20 0240 03/05/20 [id number] 03/06/20 0434 03/07/20 [id number] 03/08/20 [id number]  03/09/20 0343  PROCALCITON  --   --  0.13 0.11  --   --   --   WBC  --   --  13.8* 14.7* 11.0* 13.9* 15.5*  LATICACIDVEN 2.8* 2.2*  --   --   --   --   --    Microbiology Recent Results (from the past 240 hour(s))  Novel Coronavirus, NAA ([facility])     Status: None   Collection Time: 03/01/20  3:57 PM   Specimen: Nasopharyngeal(NP) swabs in vial transport medium   Nasopharynge  Result Value Ref Range Status   SARS-CoV-2, NAA Not Detected Not Detected Final    Comment: This nucleic acid amplification test was developed and its performance characteristics determined by [employer]. Nucleic acid amplification tests include RT-PCR and TMA. This test has not been FDA cleared or approved. This test has been authorized by FDA under an Emergency Use Authorization (EUA). This test is only authorized for the duration of time the declaration that circumstances exist justifying the authorization of the emergency use of in vitro diagnostic tests for detection of SARS-CoV-2 virus and/or diagnosis of COVID-19 infection under section 564(b)(1) of the Act, 21 U.S.C. 425ZDG-3(O) (1), unless the authorization is terminated or revoked sooner. When diagnostic testing is negative, the possibility of a false negative result should be considered in the context of a patient's recent exposures and the presence of clinical signs and symptoms consistent with COVID-19. An  individual without symptoms of COVID-19 and who is not shedding SARS-CoV-2 virus wo uld expect to have a negative (not detected) result in this assay.   Resp Panel by RT-PCR (Flu A&B, Covid) Nasopharyngeal Swab     Status: Abnormal   Collection Time: 03/05/20 12:13 AM   Specimen: Nasopharyngeal Swab; Nasopharyngeal(NP) swabs in vial transport medium  Result Value Ref Range Status   SARS Coronavirus 2 by RT PCR POSITIVE (A) NEGATIVE Final    Comment: RESULT CALLED TO, READ BACK BY AND VERIFIED WITH: SARA, RN @ 0252 ON 03/05/20 C VARNER (NOTE) SARS-CoV-2 target nucleic acids are DETECTED.  The SARS-CoV-2 RNA is generally detectable in upper respiratory specimens during the acute phase of infection. Positive results are indicative of the presence of the identified virus, but do not rule out bacterial infection or co-infection with other pathogens not detected by the test. Clinical correlation with patient history and other diagnostic information is necessary to determine patient infection status. The expected result is Negative.  Fact Sheet for Patients: BloggerCourse.com  Fact Sheet for Healthcare Providers: SeriousBroker.it  This test is not yet approved or cleared by the [place] FDA and  has been authorized for detection and/or diagnosis of SARS-CoV-2 by FDA under an Emergency Use Authorization (EUA).  This EUA will remain in effect (meaning this test can  be used) for the duration of  the COVID-19 declaration under Section 564(b)(1) of the Act, 21 U.S.C. section 360bbb-3(b)(1), unless the authorization is terminated or revoked sooner.     Influenza A by PCR NEGATIVE NEGATIVE Final   Influenza B by PCR NEGATIVE NEGATIVE Final    Comment: (NOTE) The Xpert Xpress SARS-CoV-2/FLU/RSV plus assay is intended as an aid in the diagnosis of influenza from Nasopharyngeal swab specimens and should not be used as a sole basis for  treatment. Nasal washings and aspirates are unacceptable for Xpert Xpress SARS-CoV-2/FLU/RSV testing.  Fact Sheet for Patients: BloggerCourse.com  Fact Sheet for Healthcare Providers: SeriousBroker.it  This test is not yet approved or cleared by the [place] FDA and has been authorized for detection and/or diagnosis of SARS-CoV-2 by FDA under an Emergency Use Authorization (EUA). This EUA will remain in effect (meaning this test can be used) for the duration of the COVID-19 declaration under Section 564(b)(1) of the Act, 21 U.S.C. section 360bbb-3(b)(1), unless the authorization is terminated or revoked.  Performed at [facility], [address], [place], [place] [address]      Medications:   . fluconazole  100 mg Oral QHS  . predniSONE  50 mg Oral Q breakfast  . silver sulfADIAZINE   Topical BID  . sodium chloride flush  10-40 mL Intracatheter Q12H   Continuous Infusions: . sodium chloride 125 mL/hr at 03/09/20 0538      LOS: 4 days   Marinda Elk  [facility]  03/09/2020, 9:41 AM

## 2020-03-09 NOTE — Progress Notes (Signed)
Inpatient Rehab Admissions Coordinator:   Consult received.  Patients are eligible to be considered for admit to the [facility] when cleared from airborne precautions by Acute MD, or Infectious Disease MD.  Otherwise, they will need to be >20 days from their positive test with recovery/improvement in symptoms or 2 negative tests.  Also note PT recommending SNF, OT recs for multiple venues.  Will follow from a distance until isolation is lifted to see pt progress and whether she may be a candidate for CIR.   Estill Dooms, PT, DPT Admissions Coordinator [phone] 03/09/20  10:17 AM

## 2020-03-10 ENCOUNTER — Inpatient Hospital Stay (HOSPITAL_COMMUNITY): Payer: Medicaid Other

## 2020-03-10 DIAGNOSIS — M609 Myositis, unspecified: Secondary | ICD-10-CM | POA: Diagnosis not present

## 2020-03-10 DIAGNOSIS — U071 COVID-19: Secondary | ICD-10-CM | POA: Diagnosis not present

## 2020-03-10 DIAGNOSIS — J80 Acute respiratory distress syndrome: Secondary | ICD-10-CM | POA: Diagnosis not present

## 2020-03-10 DIAGNOSIS — R1310 Dysphagia, unspecified: Secondary | ICD-10-CM | POA: Diagnosis not present

## 2020-03-10 DIAGNOSIS — M6282 Rhabdomyolysis: Secondary | ICD-10-CM | POA: Diagnosis not present

## 2020-03-10 DIAGNOSIS — R21 Rash and other nonspecific skin eruption: Secondary | ICD-10-CM | POA: Diagnosis not present

## 2020-03-10 DIAGNOSIS — R5383 Other fatigue: Secondary | ICD-10-CM | POA: Diagnosis not present

## 2020-03-10 LAB — BASIC METABOLIC PANEL
Anion gap: 8 (ref 5–15)
BUN: 22 mg/dL — ABNORMAL HIGH (ref 6–20)
CO2: 23 mmol/L (ref 22–32)
Calcium: 8.3 mg/dL — ABNORMAL LOW (ref 8.9–10.3)
Chloride: 109 mmol/L (ref 98–111)
Creatinine, Ser: 0.48 mg/dL (ref 0.44–1.00)
GFR, Estimated: >60 mL/min (ref >60)
Glucose, Bld: 102 mg/dL — ABNORMAL HIGH (ref 70–99)
Potassium: 3.9 mmol/L (ref 3.5–5.1)
Sodium: 140 mmol/L (ref 135–145)

## 2020-03-10 LAB — CBC WITH DIFFERENTIAL/PLATELET
Abs Immature Granulocytes: 0.6 10*3/uL — ABNORMAL HIGH (ref 0.00–0.07)
Basophils Absolute: 0.1 10*3/uL (ref 0.0–0.1)
Basophils Relative: 0 %
Eosinophils Absolute: 0 10*3/uL (ref 0.0–0.5)
Eosinophils Relative: 0 %
HCT: 34.6 % — ABNORMAL LOW (ref 36.0–46.0)
Hemoglobin: 11.1 g/dL — ABNORMAL LOW (ref 12.0–15.0)
Immature Granulocytes: 3 %
Lymphocytes Relative: 8 %
Lymphs Abs: 1.3 10*3/uL (ref 0.7–4.0)
MCH: 29.1 pg (ref 26.0–34.0)
MCHC: 32.1 g/dL (ref 30.0–36.0)
MCV: 90.6 fL (ref 80.0–100.0)
Monocytes Absolute: 1.3 10*3/uL — ABNORMAL HIGH (ref 0.1–1.0)
Monocytes Relative: 7 %
Neutro Abs: 14.6 10*3/uL — ABNORMAL HIGH (ref 1.7–7.7)
Neutrophils Relative %: 82 %
Platelets: 286 10*3/uL (ref 150–400)
RBC: 3.82 MIL/uL — ABNORMAL LOW (ref 3.87–5.11)
RDW: 16.2 % — ABNORMAL HIGH (ref 11.5–15.5)
WBC: 18 10*3/uL — ABNORMAL HIGH (ref 4.0–10.5)
nRBC: 0 % (ref 0.0–0.2)

## 2020-03-10 LAB — HEPATIC FUNCTION PANEL
ALT: 46 U/L — ABNORMAL HIGH (ref 0–44)
AST: 119 U/L — ABNORMAL HIGH (ref 15–41)
Albumin: 2.3 g/dL — ABNORMAL LOW (ref 3.5–5.0)
Alkaline Phosphatase: 32 U/L — ABNORMAL LOW (ref 38–126)
Bilirubin, Direct: 0.1 mg/dL (ref 0.0–0.2)
Indirect Bilirubin: 0.2 mg/dL — ABNORMAL LOW (ref 0.3–0.9)
Total Bilirubin: 0.3 mg/dL (ref 0.3–1.2)
Total Protein: 4.9 g/dL — ABNORMAL LOW (ref 6.5–8.1)

## 2020-03-10 LAB — CK: Total CK: 3010 U/L — ABNORMAL HIGH (ref 38–234)

## 2020-03-10 NOTE — Progress Notes (Signed)
   03/09/20 2303  Assess: MEWS Score  Temp 98.4 F (36.9 C)  BP 116/60  Pulse Rate (!) 111  Resp 20  Assess: MEWS Score  MEWS Temp 0  MEWS Systolic 0  MEWS Pulse 2  MEWS RR 0  MEWS LOC 0  MEWS Score 2  MEWS Score Color Yellow  Assess: if the MEWS score is Yellow or Red  Were vital signs taken at a resting state? Yes  Focused Assessment No change from prior assessment  Early Detection of Sepsis Score *See Row Information* Low  MEWS guidelines implemented *See Row Information* Yes  Treat  MEWS Interventions Other (Comment);Escalated (See documentation below) (not time for pain meds yet)  Take Vital Signs  Increase Vital Sign Frequency  Yellow: Q 2hr X 2 then Q 4hr X 2, if remains yellow, continue Q 4hrs  Escalate  MEWS: Escalate Yellow: discuss with charge nurse/RN and consider discussing with provider and RRT  Notify: Charge Nurse/RN  Name of Charge Nurse/RN Notified Tom RN  Date Charge Nurse/RN Notified 03/09/20  Time Charge Nurse/RN Notified 2305  Document  Patient Outcome Stabilized after interventions  Progress note created (see row info) Yes

## 2020-03-10 NOTE — Progress Notes (Signed)
Physical Therapy Treatment Patient Details Name: Haley Sandoval MRN: [id number] DOB: May 07, 2000 Today's Date: 03/10/2020    History of Present Illness 20 yo female admitted with rhabdomyolysis, COVID (+). Hx of pelvic inflammatory disease, R buttock abscess    PT Comments    Pt in recliner via OT.  Pt called out requesting to use the bathroom.  In room with NT attempted transfer to [facility] however unable.   Lateral/Scoot Transfers: Max assist;Total assist;+2 physical assistance;+2 safety/equipmen  tGeneral bed mobility comments: assisted back to bed pt unable/unwilling to self assist no more than 10%.  "I can't move my legs"    "I cant use my arms"   Assisted back to bed to use bed pan.   Follow Up Recommendations  SNF;Home health PT  pending progress   Equipment Recommendations  Rolling walker with 5" wheels    Recommendations for Other Services       Precautions / Restrictions Precautions Precautions: Fall Restrictions Weight Bearing Restrictions: No    Mobility  Bed Mobility Overal bed mobility: Needs Assistance Bed Mobility: Sit to Supine       Sit to supine: Total assist;Max assist   General bed mobility comments: assisted back to bed pt unable/unwilling to self assist no more than 10%.  "I can't move my legs"    "I cant use my arms"   Assisted back to use bed pan.  Transfers Overall transfer level: Needs assistance Equipment used: None Transfers: Lateral/Scoot Transfers          Lateral/Scoot Transfers: Max assist;Total assist;+2 physical assistance;+2 safety/equipment General transfer comment: pt unable/unwilling to perform a traditional sit to stand, unable/unwilling to clear hips off recliner with < 10 effort given.  Had to lateral scoot pt from drop arm recliner to drop rail bed + 2 Total Assist with great difficulty and minimal effort by pt.  "I can't move".  Ambulation/Gait                 Stairs             Wheelchair Mobility     Modified Rankin (Stroke Patients Only)       Balance                                            Cognition Arousal/Alertness: Awake/alert Behavior During Therapy: WFL for tasks assessed/performed Overall Cognitive Status: Within Functional Limits for tasks assessed                                 General Comments: AxO x 3      Exercises      General Comments        Pertinent Vitals/Pain Pain Assessment: Faces Pain Location: B LE c/o MAX edenamous Pain Descriptors / Indicators: Aching;Grimacing;Guarding;Tiring Pain Intervention(s): Monitored during session;Repositioned    Home Living                      Prior Function            PT Goals (current goals can now be found in the care plan section) Progress towards PT goals: Progressing toward goals    Frequency    Min 3X/week      PT Plan Current plan remains appropriate    Co-evaluation  AM-PAC PT "6 Clicks" Mobility   Outcome Measure  Help needed turning from your back to your side while in a flat bed without using bedrails?: A Lot Help needed moving from lying on your back to sitting on the side of a flat bed without using bedrails?: A Lot Help needed moving to and from a bed to a chair (including a wheelchair)?: Total Help needed standing up from a chair using your arms (e.g., wheelchair or bedside chair)?: Total Help needed to walk in hospital room?: Total Help needed climbing 3-5 steps with a railing? : Total 6 Click Score: 8    End of Session Equipment Utilized During Treatment: Gait belt Activity Tolerance: Patient limited by fatigue;Patient limited by pain Patient left: in bed;with call bell/phone within reach Nurse Communication: Mobility status PT Visit Diagnosis: Muscle weakness (generalized) (M62.81);Pain     Time: 1405-1430 PT Time Calculation (min) (ACUTE ONLY): 25 min  Charges:  $Therapeutic Activity: 23-37 mins                      Felecia Shelling  PTA Acute  Rehabilitation Services Pager      [phone] Office      [phone]

## 2020-03-10 NOTE — Progress Notes (Signed)
Advised RN Dahlia Client midline has sluggish blood return, even with positional changes of arm.  Unable to draw labs; Tammy with lab notified.

## 2020-03-10 NOTE — Progress Notes (Signed)
PROGRESS NOTE    Haley Sandoval  LZJ:[id number] DOB: [birth year] DOA: 03/04/2020 PCP: Barbette Merino, NP   Brief Narrative: Haley Sandoval is a 20 y.o. 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 Patient presented secondary to recurrent muscle aches with evidence of significantly elevated CK on admission. IV fluids initiated.   Assessment & Plan:   Principal Problem:   Rhabdomyolysis Active Problems:   Elevated liver enzymes   Hyponatremia   Rash   SIRS (systemic inflammatory response syndrome) (HCC)   COVID-19 virus infection   Myositits Previously diagnosed. On admission, discussion with rheumatology at [facility] recommended IVF, muscle biopsy and EMG; no beds were available at that time. Patient has been receiving IV fluids for treatment with downtrending CK. IV fluids stopped secondary to patient complaint of swelling. -Repeat CK -Standing weights/strict in and outs -Chest x-ray to ensure no significant fluid retention  Elevated AST/ALT Likely secondary to myositis and is improving.  Dysphagia Relatively new issue that started about 3 weeks ago per patient. No associated odynophagia. -Barium swallow  Hyponatremia Given IV fluids with resolution.  SIRS Present on admission. At time of admission it was not thought to be secondary to infection.  Chest rash Not infected. Per patient this is related to EKG lead stickers. -Continue wound care  Morbid obesity Body mass index is 51.73 kg/m.    DVT prophylaxis: SCDs Code Status:   Code Status: Full Code Family Communication: None at bedside Disposition Plan: Discharge to SNF likely in several days pending improvement of CK   Consultants:   None  Procedures:   None  Antimicrobials:  None    Subjective: Patient reports increased body swelling (arms and legs/thighs)  Objective: Vitals:   03/10/20 0103 03/10/20 0306 03/10/20 0705 03/10/20 1057  BP: (!) 110/58 126/73 133/79 122/82  Pulse: (!) 103 (!) 111  100 (!) 109  Resp: 20 19 19 18   Temp: 98.3 F (36.8 C) 98.4 F (36.9 C) 98.1 F (36.7 C) 98.8 F (37.1 C)  TempSrc: Oral   Oral  SpO2: 100% 100% 100% 100%  Weight:      Height:        Intake/Output Summary (Last 24 hours) at 03/10/2020 1444 Last data filed at 03/10/2020 1100 Gross per 24 hour  Intake 1638.03 ml  Output 2100 ml  Net -461.97 ml   Filed Weights   03/07/20 0500 03/08/20 0420 03/09/20 0500  Weight: 134.7 kg 135 kg (!) 141 kg    Examination:  General exam: Appears calm and comfortable. Morbidly obese. Respiratory system: Mild rales. Respiratory effort normal. Cardiovascular system: S1 & S2 heard, RRR. No murmurs, rubs, gallops or clicks. Gastrointestinal system: Abdomen is nondistended, soft and nontender. No organomegaly or masses felt. Normal bowel sounds heard. Central nervous system: Alert and oriented. No focal neurological deficits. Musculoskeletal: edema difficult to assess secondary to body habitus No calf tenderness Skin: No cyanosis. No rashes Psychiatry: Judgement and insight appear normal. Mood & affect appropriate.     Data Reviewed: I have personally reviewed following labs and imaging studies  CBC Lab Results  Component Value Date   WBC 15.5 (H) 03/09/2020   RBC 3.26 (L) 03/09/2020   HGB 9.4 (L) 03/09/2020   HCT 29.8 (L) 03/09/2020   MCV 91.4 03/09/2020   MCH 28.8 03/09/2020   PLT 275 03/09/2020   MCHC 31.5 03/09/2020   RDW 16.1 (H) 03/09/2020   LYMPHSABS 1.7 02/14/2020   MONOABS 1.5 (H) 02/14/2020   EOSABS 0.3 02/14/2020  BASOSABS 0.1 02/14/2020     Last metabolic panel Lab Results  Component Value Date   NA 140 03/10/2020   K 3.9 03/10/2020   CL 109 03/10/2020   CO2 23 03/10/2020   BUN 22 (H) 03/10/2020   CREATININE 0.48 03/10/2020   GLUCOSE 102 (H) 03/10/2020   GFRNONAA >60 03/10/2020   GFRAA 154 01/30/2020   CALCIUM 8.3 (L) 03/10/2020   PHOS 4.2 02/11/2020   PROT 4.9 (L) 03/10/2020   ALBUMIN 2.3 (L) 03/10/2020    LABGLOB 2.7 01/30/2020   AGRATIO 1.2 01/30/2020   BILITOT 0.3 03/10/2020   ALKPHOS 32 (L) 03/10/2020   AST 119 (H) 03/10/2020   ALT 46 (H) 03/10/2020   ANIONGAP 8 03/10/2020    CBG (last 3)  No results for input(s): GLUCAP in the last 72 hours.   GFR: Estimated Creatinine Clearance: 160.4 mL/min (by C-G formula based on SCr of 0.48 mg/dL).  Coagulation Profile: No results for input(s): INR, PROTIME in the last 168 hours.  Recent Results (from the past 240 hour(s))  Novel Coronavirus, NAA ([facility])     Status: None   Collection Time: 03/01/20  3:57 PM   Specimen: Nasopharyngeal(NP) swabs in vial transport medium   Nasopharynge  Result Value Ref Range Status   SARS-CoV-2, NAA Not Detected Not Detected Final    Comment: This nucleic acid amplification test was developed and its performance characteristics determined by [employer]. Nucleic acid amplification tests include RT-PCR and TMA. This test has not been FDA cleared or approved. This test has been authorized by FDA under an Emergency Use Authorization (EUA). This test is only authorized for the duration of time the declaration that circumstances exist justifying the authorization of the emergency use of in vitro diagnostic tests for detection of SARS-CoV-2 virus and/or diagnosis of COVID-19 infection under section 564(b)(1) of the Act, 21 U.S.C. 694HWT-8(U) (1), unless the authorization is terminated or revoked sooner. When diagnostic testing is negative, the possibility of a false negative result should be considered in the context of a patient's recent exposures and the presence of clinical signs and symptoms consistent with COVID-19. An individual without symptoms of COVID-19 and who is not shedding SARS-CoV-2 virus wo uld expect to have a negative (not detected) result in this assay.   Resp Panel by RT-PCR (Flu A&B, Covid) Nasopharyngeal Swab     Status: Abnormal   Collection Time: 03/05/20 12:13 AM    Specimen: Nasopharyngeal Swab; Nasopharyngeal(NP) swabs in vial transport medium  Result Value Ref Range Status   SARS Coronavirus 2 by RT PCR POSITIVE (A) NEGATIVE Final    Comment: RESULT CALLED TO, READ BACK BY AND VERIFIED WITH: SARA, RN @ 0252 ON 03/05/20 C VARNER (NOTE) SARS-CoV-2 target nucleic acids are DETECTED.  The SARS-CoV-2 RNA is generally detectable in upper respiratory specimens during the acute phase of infection. Positive results are indicative of the presence of the identified virus, but do not rule out bacterial infection or co-infection with other pathogens not detected by the test. Clinical correlation with patient history and other diagnostic information is necessary to determine patient infection status. The expected result is Negative.  Fact Sheet for Patients: BloggerCourse.com  Fact Sheet for Healthcare Providers: SeriousBroker.it  This test is not yet approved or cleared by the [place] FDA and  has been authorized for detection and/or diagnosis of SARS-CoV-2 by FDA under an Emergency Use Authorization (EUA).  This EUA will remain in effect (meaning this test can  be used) for  the duration of  the COVID-19 declaration under Section 564(b)(1) of the Act, 21 U.S.C. section 360bbb-3(b)(1), unless the authorization is terminated or revoked sooner.     Influenza A by PCR NEGATIVE NEGATIVE Final   Influenza B by PCR NEGATIVE NEGATIVE Final    Comment: (NOTE) The Xpert Xpress SARS-CoV-2/FLU/RSV plus assay is intended as an aid in the diagnosis of influenza from Nasopharyngeal swab specimens and should not be used as a sole basis for treatment. Nasal washings and aspirates are unacceptable for Xpert Xpress SARS-CoV-2/FLU/RSV testing.  Fact Sheet for Patients: BloggerCourse.com  Fact Sheet for Healthcare Providers: SeriousBroker.it  This test is not  yet approved or cleared by the [place] FDA and has been authorized for detection and/or diagnosis of SARS-CoV-2 by FDA under an Emergency Use Authorization (EUA). This EUA will remain in effect (meaning this test can be used) for the duration of the COVID-19 declaration under Section 564(b)(1) of the Act, 21 U.S.C. section 360bbb-3(b)(1), unless the authorization is terminated or revoked.  Performed at [facility], [address], [place], [place] [address]         Radiology Studies: No results found.      Scheduled Meds: . fluconazole  100 mg Oral QHS  . predniSONE  50 mg Oral Q breakfast  . silver sulfADIAZINE   Topical BID  . sodium chloride flush  10-40 mL Intracatheter Q12H   Continuous Infusions:   LOS: 5 days     Jacquelin Hawking, MD [facility] 03/10/2020, 2:44 PM  If 7PM-7AM, please contact night-coverage [email or web address]

## 2020-03-10 NOTE — Progress Notes (Signed)
Occupational Therapy Progress Note  Patient required significantly increased time for all mobility due to pain in all 4 extremities 2* edematous. Max encouragement provided with mod A for supine to sit. Provided multiple scenarios to attempt transfer to chair with patient unable to maintain L UE on walker to attempt sit to stand. Ultimately patient lateral scoot toward drop arm recliner requiring mod A to safely complete as patient stops midway through transfer d/t pain. Anticipate patient will progress quickly as edema decreased, patient reports she has only had 1 day of her "water pill" so far.    03/10/20 1500  OT Visit Information  Last OT Received On 03/10/20  Assistance Needed +2  History of Present Illness 20 yo female admitted with rhabdomyolysis, COVID (+). Hx of pelvic inflammatory disease, R buttock abscess  Precautions  Precautions Fall  Precaution Comments monitor HR  Pain Assessment  Pain Assessment Faces  Faces Pain Scale 8  Pain Location B LE c/o MAX edematous  Pain Descriptors / Indicators Crying;Moaning;Heaviness  Pain Intervention(s) Monitored during session  Cognition  Arousal/Alertness Awake/alert  Behavior During Therapy Flat affect (tearful)  Overall Cognitive Status Within Functional Limits for tasks assessed  ADL  Overall ADL's  Needs assistance/impaired  Toilet Transfer Moderate assistance  Toilet Transfer Details (indicate cue type and reason) lateral scooting to drop arm recliner, requiring significantly increased time due to patient reporting pain in all extremities 2* edema. providing patient with cues for technique to attempt transfer with patient ultimately scooting laterally along EOB to chair. Patient require mod A with use of bed pad to safely complete scooting into chair as patient stops halfway through transition to recliner  Bed Mobility  Overal bed mobility Needs Assistance  Bed Mobility Supine to Sit  Supine to sit Mod assist;HOB elevated   General bed mobility comments significantly increased time with max cues for encouragement and problem solving to use bed rails and sliding LEs along bed vs trying to lift. mod A with use of bed pad to push up from elbow to come up to sitting  Balance  Overall balance assessment Needs assistance  Sitting-balance support Feet supported  Sitting balance-Leahy Scale Fair  Transfers  Overall transfer level Needs assistance  Equipment used None  Transfers Lateral/Scoot Transfers   Lateral/Scoot Transfers Mod assist;From elevated surface  General transfer comment please see toilet transfer in ADL section, takes significantly increased time to complete transfer  General Comments  General comments (skin integrity, edema, etc.) VSS on room air  OT - End of Session  Activity Tolerance Patient limited by pain  Patient left in chair;with call bell/phone within reach  Nurse Communication Mobility status  OT Assessment/Plan  OT Visit Diagnosis Other abnormalities of gait and mobility (R26.89);Muscle weakness (generalized) (M62.81);Pain;Other symptoms and signs involving the nervous system (R29.898)  Pain - part of body  (all 4 extremities)  OT Frequency (ACUTE ONLY) Min 2X/week  Follow Up Recommendations CIR;Other (comment) (vs HH pending progress)  OT Equipment Other (comment) (TBD)  AM-PAC OT "6 Clicks" Daily Activity Outcome Measure (Version 2)  Help from another person eating meals? 3  Help from another person taking care of personal grooming? 3  Help from another person toileting, which includes using toliet, bedpan, or urinal? 1  Help from another person bathing (including washing, rinsing, drying)? 2  Help from another person to put on and taking off regular upper body clothing? 2  Help from another person to put on and taking off regular lower body clothing? 1  6 Click Score 12  OT Goal Progression  Progress towards OT goals Progressing toward goals  Acute Rehab OT Goals  Patient  Stated Goal to be independent again  OT Goal Formulation With patient  Time For Goal Achievement 03/21/20  Potential to Achieve Goals Good  ADL Goals  Pt Will Perform Grooming with modified independence  Pt Will Perform Upper Body Bathing with modified independence  Pt Will Perform Lower Body Bathing with modified independence  Pt Will Perform Upper Body Dressing with modified independence  Pt Will Perform Lower Body Dressing with modified independence  Pt Will Transfer to Toilet with modified independence;ambulating;regular height toilet;grab bars  Pt Will Perform Toileting - Clothing Manipulation and hygiene with modified independence;sit to/from stand  OT Time Calculation  OT Start Time (ACUTE ONLY) 1118  OT Stop Time (ACUTE ONLY) 1158  OT Time Calculation (min) 40 min  OT General Charges  $OT Visit 1 Visit  OT Treatments  $Self Care/Home Management  38-52 mins   Marlyce Huge OT OT pager: [phone]

## 2020-03-10 NOTE — Progress Notes (Signed)
   03/10/20 0306  Assess: MEWS Score  Temp 98.4 F (36.9 C)  BP 126/73  Pulse Rate (!) 111  Resp 19  Level of Consciousness Alert  SpO2 100 %  O2 Device Room Air  Assess: MEWS Score  MEWS Temp 0  MEWS Systolic 0  MEWS Pulse 2  MEWS RR 0  MEWS LOC 0  MEWS Score 2  MEWS Score Color Yellow  Assess: if the MEWS score is Yellow or Red  Were vital signs taken at a resting state? Yes  Focused Assessment No change from prior assessment  Early Detection of Sepsis Score *See Row Information* Low  MEWS guidelines implemented *See Row Information* No, previously yellow, continue vital signs every 4 hours  Treat  Pain Scale 0-10  Pain Score 0  Document  Patient Outcome Stabilized after interventions  Progress note created (see row info) Yes

## 2020-03-11 ENCOUNTER — Inpatient Hospital Stay (HOSPITAL_COMMUNITY): Payer: Medicaid Other

## 2020-03-11 DIAGNOSIS — R1310 Dysphagia, unspecified: Secondary | ICD-10-CM

## 2020-03-11 DIAGNOSIS — M609 Myositis, unspecified: Secondary | ICD-10-CM | POA: Diagnosis not present

## 2020-03-11 DIAGNOSIS — M6282 Rhabdomyolysis: Secondary | ICD-10-CM | POA: Diagnosis not present

## 2020-03-11 LAB — BASIC METABOLIC PANEL
Anion gap: 9 (ref 5–15)
BUN: 15 mg/dL (ref 6–20)
CO2: 26 mmol/L (ref 22–32)
Calcium: 8.7 mg/dL — ABNORMAL LOW (ref 8.9–10.3)
Chloride: 104 mmol/L (ref 98–111)
Creatinine, Ser: 0.42 mg/dL — ABNORMAL LOW (ref 0.44–1.00)
GFR, Estimated: >60 mL/min (ref >60)
Glucose, Bld: 86 mg/dL (ref 70–99)
Potassium: 4 mmol/L (ref 3.5–5.1)
Sodium: 139 mmol/L (ref 135–145)

## 2020-03-11 LAB — CK: Total CK: 2272 U/L — ABNORMAL HIGH (ref 38–234)

## 2020-03-11 MED ORDER — LIP MEDEX EX OINT
TOPICAL_OINTMENT | CUTANEOUS | Status: DC | PRN
  Administered 2020-03-11: 1 via TOPICAL
  Filled 2020-03-11: qty 7

## 2020-03-11 MED ORDER — FUROSEMIDE 10 MG/ML IJ SOLN
40.0000 mg | Freq: Once | INTRAMUSCULAR | Status: AC
  Administered 2020-03-11: 40 mg via INTRAVENOUS
  Filled 2020-03-11: qty 4

## 2020-03-11 NOTE — Progress Notes (Signed)
Responding to Spiritual Care Consult, Chaplain checked with nurse who fears pt may have lost her will to live b/c of Covid and b/c her mother died from [place] on 03-07-22.  Chaplain offered a ministry of presence as pt was anxious to talk about her mom.  She told of how her mom had cared for her when she had a hospitalization in December, bringing her special food and making her birthday a special event even though it was in the hospital.  Pt has regrets over not calling EMT that morning as she was headed out for a Dr appt, Pt questions why God allowed this to happen when she knows she's not supposed to.  Chaplain assured Pt God can hear her pain and questions and will always be there for her just as her mom was always there even during their rough relationship times.  Chaplain asked what pt had to look forward to, if anything; upheld Pt's plan to start journaling when she gets out of hospital.  Pt reports having a strong community of friends who will support her when she is discharged.  Spiritual Care support is available for additional consult as needed.  Vernell Morgans Chaplain

## 2020-03-11 NOTE — Progress Notes (Signed)
PROGRESS NOTE    Haley Sandoval  OQH:[id number] DOB: May 14, 2000 DOA: 03/04/2020 PCP: Barbette Merino, NP   Brief Narrative: Haley Sandoval is a 20 y.o. 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 Patient presented secondary to recurrent muscle aches with evidence of significantly elevated CK on admission. IV fluids initiated.   Assessment & Plan:   Principal Problem:   Rhabdomyolysis Active Problems:   Elevated liver enzymes   Hyponatremia   Rash   SIRS (systemic inflammatory response syndrome) (HCC)   COVID-19 virus infection   Myositits Previously diagnosed. On admission, discussion with rheumatology at [facility] recommended IVF, muscle biopsy and EMG; no beds were available at that time. Patient has been receiving IV fluids for treatment with downtrending CK. IV fluids stopped secondary to patient complaint of swelling. -Standing weights/strict in and outs  Elevated AST/ALT Likely secondary to myositis and is improving.  Anasarca Secondary to IV fluid resuscitation in setting of hypoalbuminemia. -Lasix 40 mg IV x1, watch response -Daily weights  Dysphagia Relatively new issue that started about 3 weeks ago per patient. No associated odynophagia. Barium swallow significant for non-specific dysmotility issue. Discussed with Dr. Warrick Parisian who recommended outpatient follow-up since no obvious stricture was seen. -GI recommendations: finely chopped food, chew food with liquid, outpatient follow-up as needed  Hyponatremia Given IV fluids with resolution.  SIRS Present on admission. At time of admission it was not thought to be secondary to infection.  Chest rash Not infected. Per patient this is related to EKG lead stickers. -Continue wound care  Morbid obesity Body mass index is 50.04 kg/m.    DVT prophylaxis: SCDs Code Status:   Code Status: Full Code Family Communication: None at bedside Disposition Plan: Discharge to SNF likely in 2-4 days pending improvement of  CK   Consultants:   [facility] (curbside)  Procedures:   None  Antimicrobials:  None    Subjective: Feels she is still very swollen. Cannot move left arm too high because of significant swelling.  Objective: Vitals:   03/10/20 1500 03/10/20 2033 03/11/20 0411 03/11/20 0600  BP: 127/76 109/69 131/86   Pulse: 100 (!) 105 93   Resp: 18 17 18    Temp: 98.7 F (37.1 C) 98.3 F (36.8 C) 98.4 F (36.9 C)   TempSrc: Oral     SpO2: 100% 98% 100%   Weight:    (!) 136.4 kg  Height:        Intake/Output Summary (Last 24 hours) at 03/11/2020 1334 Last data filed at 03/11/2020 0825 Gross per 24 hour  Intake --  Output 2150 ml  Net -2150 ml   Filed Weights   03/08/20 0420 03/09/20 0500 03/11/20 0600  Weight: 135 kg (!) 141 kg (!) 136.4 kg    Examination:  General exam: Appears calm and comfortable Respiratory system: Clear to auscultation. Respiratory effort normal. Cardiovascular system: S1 & S2 heard, RRR. No murmurs, rubs, gallops or clicks. Gastrointestinal system: Abdomen is nondistended, soft and nontender. No organomegaly or masses felt. Normal bowel sounds heard. Central nervous system: Alert and oriented. No focal neurological deficits. Musculoskeletal: Patient does have notable edema in all extremities but seems worse in left arm. No calf tenderness Skin: No cyanosis. No rashes Psychiatry: Judgement and insight appear normal. Depressed mood.    Data Reviewed: I have personally reviewed following labs and imaging studies  CBC Lab Results  Component Value Date   WBC 18.0 (H) 03/10/2020   RBC 3.82 (L) 03/10/2020   HGB 11.1 (L)  03/10/2020   HCT 34.6 (L) 03/10/2020   MCV 90.6 03/10/2020   MCH 29.1 03/10/2020   PLT 286 03/10/2020   MCHC 32.1 03/10/2020   RDW 16.2 (H) 03/10/2020   LYMPHSABS 1.3 03/10/2020   MONOABS 1.3 (H) 03/10/2020   EOSABS 0.0 03/10/2020   BASOSABS 0.1 03/10/2020     Last metabolic panel Lab Results  Component Value Date   NA 139  03/11/2020   K 4.0 03/11/2020   CL 104 03/11/2020   CO2 26 03/11/2020   BUN 15 03/11/2020   CREATININE 0.42 (L) 03/11/2020   GLUCOSE 86 03/11/2020   GFRNONAA >60 03/11/2020   GFRAA 154 01/30/2020   CALCIUM 8.7 (L) 03/11/2020   PHOS 4.2 02/11/2020   PROT 4.9 (L) 03/10/2020   ALBUMIN 2.3 (L) 03/10/2020   LABGLOB 2.7 01/30/2020   AGRATIO 1.2 01/30/2020   BILITOT 0.3 03/10/2020   ALKPHOS 32 (L) 03/10/2020   AST 119 (H) 03/10/2020   ALT 46 (H) 03/10/2020   ANIONGAP 9 03/11/2020    CBG (last 3)  No results for input(s): GLUCAP in the last 72 hours.   GFR: Estimated Creatinine Clearance: 157.3 mL/min (A) (by C-G formula based on SCr of 0.42 mg/dL (L)).  Coagulation Profile: No results for input(s): INR, PROTIME in the last 168 hours.  Recent Results (from the past 240 hour(s))  Novel Coronavirus, NAA ([facility])     Status: None   Collection Time: 03/01/20  3:57 PM   Specimen: Nasopharyngeal(NP) swabs in vial transport medium   Nasopharynge  Result Value Ref Range Status   SARS-CoV-2, NAA Not Detected Not Detected Final    Comment: This nucleic acid amplification test was developed and its performance characteristics determined by [employer]. Nucleic acid amplification tests include RT-PCR and TMA. This test has not been FDA cleared or approved. This test has been authorized by FDA under an Emergency Use Authorization (EUA). This test is only authorized for the duration of time the declaration that circumstances exist justifying the authorization of the emergency use of in vitro diagnostic tests for detection of SARS-CoV-2 virus and/or diagnosis of COVID-19 infection under section 564(b)(1) of the Act, 21 U.S.C. 098JXB-1(Y) (1), unless the authorization is terminated or revoked sooner. When diagnostic testing is negative, the possibility of a false negative result should be considered in the context of a patient's recent exposures and the presence of clinical signs  and symptoms consistent with COVID-19. An individual without symptoms of COVID-19 and who is not shedding SARS-CoV-2 virus wo uld expect to have a negative (not detected) result in this assay.   Resp Panel by RT-PCR (Flu A&B, Covid) Nasopharyngeal Swab     Status: Abnormal   Collection Time: 03/05/20 12:13 AM   Specimen: Nasopharyngeal Swab; Nasopharyngeal(NP) swabs in vial transport medium  Result Value Ref Range Status   SARS Coronavirus 2 by RT PCR POSITIVE (A) NEGATIVE Final    Comment: RESULT CALLED TO, READ BACK BY AND VERIFIED WITH: SARA, RN @ 0252 ON 03/05/20 C VARNER (NOTE) SARS-CoV-2 target nucleic acids are DETECTED.  The SARS-CoV-2 RNA is generally detectable in upper respiratory specimens during the acute phase of infection. Positive results are indicative of the presence of the identified virus, but do not rule out bacterial infection or co-infection with other pathogens not detected by the test. Clinical correlation with patient history and other diagnostic information is necessary to determine patient infection status. The expected result is Negative.  Fact Sheet for Patients: BloggerCourse.com  Fact Sheet  for Healthcare Providers: SeriousBroker.it  This test is not yet approved or cleared by the [place] and  has been authorized for detection and/or diagnosis of SARS-CoV-2 by FDA under an Emergency Use Authorization (EUA).  This EUA will remain in effect (meaning this test can  be used) for the duration of  the COVID-19 declaration under Section 564(b)(1) of the Act, 21 U.S.C. section 360bbb-3(b)(1), unless the authorization is terminated or revoked sooner.     Influenza A by PCR NEGATIVE NEGATIVE Final   Influenza B by PCR NEGATIVE NEGATIVE Final    Comment: (NOTE) The Xpert Xpress SARS-CoV-2/FLU/RSV plus assay is intended as an aid in the diagnosis of influenza from Nasopharyngeal swab specimens  and should not be used as a sole basis for treatment. Nasal washings and aspirates are unacceptable for Xpert Xpress SARS-CoV-2/FLU/RSV testing.  Fact Sheet for Patients: BloggerCourse.com  Fact Sheet for Healthcare Providers: SeriousBroker.it  This test is not yet approved or cleared by the [place] FDA and has been authorized for detection and/or diagnosis of SARS-CoV-2 by FDA under an Emergency Use Authorization (EUA). This EUA will remain in effect (meaning this test can be used) for the duration of the COVID-19 declaration under Section 564(b)(1) of the Act, 21 U.S.C. section 360bbb-3(b)(1), unless the authorization is terminated or revoked.  Performed at [facility], [address] [place], [place] [address]         Radiology Studies: DG CHEST PORT 1 VIEW  Result Date: 03/10/2020 CLINICAL DATA:  COVID positive 03/05/2020, rash and fatigue EXAM: PORTABLE CHEST 1 VIEW COMPARISON:  Radiograph 03/05/2020 FINDINGS: Increased attenuation towards the lung bases likely a combination of breast tissue and body habitus. Accounting for body habitus, the lungs are clear. No consolidation, features of edema, pneumothorax, or effusion. Pulmonary vascularity is normally distributed. The cardiomediastinal contours are unremarkable. No acute osseous or soft tissue abnormality. IMPRESSION: No acute cardiopulmonary abnormality. Electronically Signed   By: Kreg Shropshire M.D.   On: 03/10/2020 19:40   DG ESOPHAGUS W SINGLE CM (SOL OR THIN BA)  Result Date: 03/11/2020 CLINICAL DATA:  Dysphagia. EXAM: ESOPHOGRAM/BARIUM SWALLOW TECHNIQUE: Single contrast examination was performed using  thin barium. FLUOROSCOPY TIME:  Fluoroscopy Time:  2 minutes and 30 seconds Radiation Exposure Index (if provided by the fluoroscopic device): 44.3 mGy fall Number of Acquired Spot Images: 0 COMPARISON:  None. FINDINGS: Focused, single-contrast  exam performed. Limitations secondary to patient immobility. Evaluation of esophageal peristalsis demonstrates suggestion of mild stasis in the lower esophagus. Full column evaluation of the esophagus demonstrates no persistent narrowing or stricture. No hiatal hernia. A 13 mm barium tablet passes promptly. IMPRESSION: Focused, single-contrast exam performed with the patient in LPO position. No esophageal narrowing or stricture. Possible mild nonspecific esophageal dysmotility. Please note that the hypopharyngeal region is not well evaluated secondary to technique and patient immobility. Electronically Signed   By: Jeronimo Greaves M.D.   On: 03/11/2020 10:11        Scheduled Meds: . fluconazole  100 mg Oral QHS  . furosemide  40 mg Intravenous Once  . silver sulfADIAZINE   Topical BID  . sodium chloride flush  10-40 mL Intracatheter Q12H   Continuous Infusions:   LOS: 6 days     Jacquelin Hawking, MD [facility] 03/11/2020, 1:34 PM  If 7PM-7AM, please contact night-coverage [email or web address]

## 2020-03-12 DIAGNOSIS — M609 Myositis, unspecified: Secondary | ICD-10-CM | POA: Diagnosis not present

## 2020-03-12 DIAGNOSIS — R1310 Dysphagia, unspecified: Secondary | ICD-10-CM | POA: Diagnosis not present

## 2020-03-12 DIAGNOSIS — M6282 Rhabdomyolysis: Secondary | ICD-10-CM | POA: Diagnosis not present

## 2020-03-12 LAB — CBC
HCT: 34.9 % — ABNORMAL LOW (ref 36.0–46.0)
Hemoglobin: 11.3 g/dL — ABNORMAL LOW (ref 12.0–15.0)
MCH: 29 pg (ref 26.0–34.0)
MCHC: 32.4 g/dL (ref 30.0–36.0)
MCV: 89.7 fL (ref 80.0–100.0)
Platelets: 287 10*3/uL (ref 150–400)
RBC: 3.89 MIL/uL (ref 3.87–5.11)
RDW: 16.7 % — ABNORMAL HIGH (ref 11.5–15.5)
WBC: 16.4 10*3/uL — ABNORMAL HIGH (ref 4.0–10.5)
nRBC: 0 % (ref 0.0–0.2)

## 2020-03-12 LAB — BASIC METABOLIC PANEL
Anion gap: 10 (ref 5–15)
BUN: 15 mg/dL (ref 6–20)
CO2: 28 mmol/L (ref 22–32)
Calcium: 8.7 mg/dL — ABNORMAL LOW (ref 8.9–10.3)
Chloride: 103 mmol/L (ref 98–111)
Creatinine, Ser: 0.5 mg/dL (ref 0.44–1.00)
GFR, Estimated: >60 mL/min (ref >60)
Glucose, Bld: 90 mg/dL (ref 70–99)
Potassium: 4 mmol/L (ref 3.5–5.1)
Sodium: 141 mmol/L (ref 135–145)

## 2020-03-12 LAB — CK: Total CK: 2104 U/L — ABNORMAL HIGH (ref 38–234)

## 2020-03-12 MED ORDER — FUROSEMIDE 10 MG/ML IJ SOLN
40.0000 mg | Freq: Once | INTRAMUSCULAR | Status: AC
  Administered 2020-03-12: 40 mg via INTRAVENOUS
  Filled 2020-03-12: qty 4

## 2020-03-12 MED ORDER — PANTOPRAZOLE SODIUM 40 MG PO TBEC
40.0000 mg | DELAYED_RELEASE_TABLET | Freq: Two times a day (BID) | ORAL | Status: DC
  Administered 2020-03-12 – 2020-03-16 (×7): 40 mg via ORAL
  Filled 2020-03-12 (×8): qty 1

## 2020-03-12 NOTE — Progress Notes (Addendum)
Occupational Therapy Treatment Patient Details Name: Haley Sandoval MRN: [id number] DOB: 07/05/2000 Today's Date: 03/12/2020    History of present illness 20 yo female admitted with rhabdomyolysis, COVID (+). Hx of pelvic inflammatory disease, R buttock abscess   OT comments  Patient supine in bed when therapist entered the room. Patient mod assist with use of bed rail and increased time to transfer to side of bed - with therapist assisting with LEs and trunk negotiating and using tactile cues to initiate movement. Patient performed bathing task at side of bed - to promote independence of UE movement. Patient min assist (+2 for safety) to stand with walker for LB bathing. Patient min x 2 to stand with walker to take steps to recliner. Patient let go of walker mid transfer and therapist and tech provided min assist through UEs to steady patient and reach recliner. Patient reports her legs felt weak. Increased time throughout treatment needed for encouragement and patient moving slow due to pain with most complaints of pain in LEs with weight bearing. Therapist educated patient on exercises and movements to perform while in chair and bed to reduce edema in LUE and LEs, increase strength and reduce pain. Patient verbalized understanding. Patient exhibited improved activity tolerance today. Hope to progress well towards goals.    Follow Up Recommendations  CIR;Other (comment)    Equipment Recommendations  Other (comment)    Recommendations for Other Services      Precautions / Restrictions Precautions Precautions: Fall Restrictions Weight Bearing Restrictions: No       Mobility Bed Mobility Overal bed mobility: Needs Assistance Bed Mobility: Supine to Sit     Supine to sit: Mod assist;HOB elevated;+2 for safety/equipment     General bed mobility comments: Transferred to side of bed with increased time, therapist assisting with guiding LEs and initiating moement, patient used bed  rail to assist with transfer.  Transfers Overall transfer level: Needs assistance Equipment used: Rolling walker (2 wheeled);None Transfers: Sit to/from [employer] to Stand: Min assist;From elevated surface;+2 safety/equipment Stand pivot transfers: Min assist;+2 safety/equipment       General transfer comment: Min assist to stand x 2 with RW from elevated bed height. WIth first stand for bathing patient bent over resting forearms on walker. On second stand patient came into full standing with RW and took steps to recliner. Patient left walker halfway through transfer to recliner - required min assist x 2 for stabilizing patient as she repors her legs felt week.    Balance Overall balance assessment: Needs assistance Sitting-balance support: No upper extremity supported Sitting balance-Leahy Scale: Good     Standing balance support: Bilateral upper extremity supported;During functional activity Standing balance-Leahy Scale: Poor Standing balance comment: Reliant on external support                           ADL either performed or assessed with clinical judgement   ADL Overall ADL's : Needs assistance/impaired     Grooming: Wash/dry face;Set up Grooming Details (indicate cue type and reason): washed face at side of bed with right hand. Upper Body Bathing: Moderate assistance;Sitting Upper Body Bathing Details (indicate cue type and reason): Patient washed under left arm and stomach sitting at side of bed. THerapist assisted with Right arm and back. Lower Body Bathing: Maximal assistance;Sit to/from stand Lower Body Bathing Details (indicate cue type and reason): Patient able to wash upper thighs. THerapist assisted with LEs and buttocks. Upper  Body Dressing : Sitting;Maximal assistance Upper Body Dressing Details (indicate cue type and reason): max assist to don hospital gown.                         Vision Patient Visual Report: No  change from baseline     Perception     Praxis      Cognition Arousal/Alertness: Awake/alert Behavior During Therapy: Flat affect Overall Cognitive Status: Within Functional Limits for tasks assessed                                          Exercises     Shoulder Instructions       General Comments      Pertinent Vitals/ Pain       Pain Assessment: Faces Faces Pain Scale: Hurts even more Pain Location: BLE Pain Descriptors / Indicators: Crying;Moaning;Heaviness Pain Intervention(s): Limited activity within patient's tolerance;Monitored during session  Home Living                                          Prior Functioning/Environment              Frequency  Min 2X/week        Progress Toward Goals  OT Goals(current goals can now be found in the care plan section)  Progress towards OT goals: Progressing toward goals  Acute Rehab OT Goals Patient Stated Goal: to be independent again OT Goal Formulation: With patient Time For Goal Achievement: 03/21/20 Potential to Achieve Goals: Good  Plan Discharge plan remains appropriate    Co-evaluation                 AM-PAC OT "6 Clicks" Daily Activity     Outcome Measure   Help from another person eating meals?: A Little Help from another person taking care of personal grooming?: A Little Help from another person toileting, which includes using toliet, bedpan, or urinal?: A Lot Help from another person bathing (including washing, rinsing, drying)?: A Lot Help from another person to put on and taking off regular upper body clothing?: A Lot Help from another person to put on and taking off regular lower body clothing?: Total 6 Click Score: 13    End of Session Equipment Utilized During Treatment: Rolling walker  OT Visit Diagnosis: Other abnormalities of gait and mobility (R26.89);Muscle weakness (generalized) (M62.81);Pain;Other symptoms and signs involving the  nervous system (R29.898)   Activity Tolerance Patient limited by pain   Patient Left in chair;with call bell/phone within reach   Nurse Communication Mobility status        Time: 2035-5974 OT Time Calculation (min): 34 min  Charges: OT General Charges $OT Visit: 1 Visit OT Treatments $Self Care/Home Management : 8-22 mins $Therapeutic Activity: 8-22 mins  Waldron Session, OTR/L Acute Care Rehab Services  Office [phone] Pager: [phone]    Kelli Churn 03/12/2020, 10:56 AM

## 2020-03-12 NOTE — Progress Notes (Signed)
PROGRESS NOTE    Haley Sandoval  ONG:[id number] DOB: 02/11/2001 DOA: 03/04/2020 PCP: Barbette Merino, NP   Brief Narrative: Haley Sandoval is a 20 y.o. 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 Patient presented secondary to recurrent muscle aches with evidence of significantly elevated CK on admission. IV fluids initiated.   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 On admission, discussion with rheumatology at [facility] recommended IVF, muscle biopsy and EMG; no beds were available at that time. Patient has been receiving IV fluids for treatment with slowly downtrending CK. IV fluids stopped secondary to patient complaint of swelling. -Standing weights/strict in and outs  Elevated AST/ALT Likely secondary to myositis and is improving.  Anasarca Secondary to IV fluid resuscitation in setting of hypoalbuminemia. Weight on admission of 297 lbs. Peak weight of 310 lbs and trending down -Another dose of Lasix 40 mg IV x1, watch response -Daily weights  Dysphagia Relatively new issue that started about 3 weeks ago per patient. No associated odynophagia. Barium swallow significant for non-specific dysmotility issue. -GI recommendations: finely chopped food, chew food with liquid, outpatient follow-up as needed, Protonix 40 mg BID x 3 months  Hyponatremia Given IV fluids with resolution.  SIRS Present on admission. At time of admission it was not thought to be secondary to infection.  Chest rash Not infected. Per patient this is related to EKG lead stickers. -Continue wound care  Morbid obesity Body mass index is 50.04 kg/m.    DVT prophylaxis: SCDs Code Status:   Code Status: Full Code Family Communication: None at bedside Disposition Plan: Discharge to SNF  likely in 2-4 days pending improvement of CK   Consultants:   [facility]  Procedures:   None  Antimicrobials:  None    Subjective: Continues to feel swollen. Her swelling has caused her body to hurt which is different from the pain that initially brought her into the hospital.  Objective: Vitals:   03/11/20 0600 03/11/20 1349 03/11/20 2029 03/12/20 0508  BP:  126/72 138/73 114/66  Pulse:  (!) 110 (!) 108 (!) 107  Resp:  18 18 18   Temp:  98.1 F (36.7 C) 98.8 F (37.1 C) 98.8 F (37.1 C)  TempSrc:  Oral    SpO2:  100% 100% 99%  Weight: (!) 136.4 kg     Height:        Intake/Output Summary (Last 24 hours) at 03/12/2020 0759 Last data filed at 03/12/2020 0500 Gross per 24 hour  Intake 240 ml  Output 2600 ml  Net -2360 ml   Filed Weights   03/08/20 0420 03/09/20 0500 03/11/20 0600  Weight: 135 kg (!) 141 kg (!) 136.4 kg    Examination:  General exam: Appears calm and comfortable Respiratory system: Clear to auscultation. Respiratory effort normal. Cardiovascular system: S1 & S2 heard, RRR. No murmurs, rubs, gallops or clicks. Gastrointestinal system: Abdomen is nondistended, soft and nontender. No organomegaly or masses felt. Normal bowel sounds heard. Central nervous system: Alert and oriented. No focal neurological deficits. Musculoskeletal: Upper/lower extremity pitting edema. No calf tenderness Skin: No cyanosis. No rashes Psychiatry: Judgement and insight appear normal. Mood & affect appropriate.     Data Reviewed: I have personally reviewed following labs and imaging studies  CBC Lab Results  Component Value Date  WBC 16.4 (H) 03/12/2020   RBC 3.89 03/12/2020   HGB 11.3 (L) 03/12/2020   HCT 34.9 (L) 03/12/2020   MCV 89.7 03/12/2020   MCH 29.0 03/12/2020   PLT 287 03/12/2020   MCHC 32.4 03/12/2020   RDW 16.7 (H) 03/12/2020   LYMPHSABS 1.3 03/10/2020   MONOABS 1.3 (H) 03/10/2020   EOSABS 0.0 03/10/2020   BASOSABS 0.1 03/10/2020     Last  metabolic panel Lab Results  Component Value Date   NA 141 03/12/2020   K 4.0 03/12/2020   CL 103 03/12/2020   CO2 28 03/12/2020   BUN 15 03/12/2020   CREATININE 0.50 03/12/2020   GLUCOSE 90 03/12/2020   GFRNONAA >60 03/12/2020   GFRAA 154 01/30/2020   CALCIUM 8.7 (L) 03/12/2020   PHOS 4.2 02/11/2020   PROT 4.9 (L) 03/10/2020   ALBUMIN 2.3 (L) 03/10/2020   LABGLOB 2.7 01/30/2020   AGRATIO 1.2 01/30/2020   BILITOT 0.3 03/10/2020   ALKPHOS 32 (L) 03/10/2020   AST 119 (H) 03/10/2020   ALT 46 (H) 03/10/2020   ANIONGAP 10 03/12/2020    CBG (last 3)  No results for input(s): GLUCAP in the last 72 hours.   GFR: Estimated Creatinine Clearance: 157.3 mL/min (by C-G formula based on SCr of 0.5 mg/dL).  Coagulation Profile: No results for input(s): INR, PROTIME in the last 168 hours.  Recent Results (from the past 240 hour(s))  Resp Panel by RT-PCR (Flu A&B, Covid) Nasopharyngeal Swab     Status: Abnormal   Collection Time: 03/05/20 12:13 AM   Specimen: Nasopharyngeal Swab; Nasopharyngeal(NP) swabs in vial transport medium  Result Value Ref Range Status   SARS Coronavirus 2 by RT PCR POSITIVE (A) NEGATIVE Final    Comment: RESULT CALLED TO, READ BACK BY AND VERIFIED WITH: SARA, RN @ 0252 ON 03/05/20 C VARNER (NOTE) SARS-CoV-2 target nucleic acids are DETECTED.  The SARS-CoV-2 RNA is generally detectable in upper respiratory specimens during the acute phase of infection. Positive results are indicative of the presence of the identified virus, but do not rule out bacterial infection or co-infection with other pathogens not detected by the test. Clinical correlation with patient history and other diagnostic information is necessary to determine patient infection status. The expected result is Negative.  Fact Sheet for Patients: BloggerCourse.com  Fact Sheet for Healthcare Providers: SeriousBroker.it  This test is not yet  approved or cleared by the [place] FDA and  has been authorized for detection and/or diagnosis of SARS-CoV-2 by FDA under an Emergency Use Authorization (EUA).  This EUA will remain in effect (meaning this test can  be used) for the duration of  the COVID-19 declaration under Section 564(b)(1) of the Act, 21 U.S.C. section 360bbb-3(b)(1), unless the authorization is terminated or revoked sooner.     Influenza A by PCR NEGATIVE NEGATIVE Final   Influenza B by PCR NEGATIVE NEGATIVE Final    Comment: (NOTE) The Xpert Xpress SARS-CoV-2/FLU/RSV plus assay is intended as an aid in the diagnosis of influenza from Nasopharyngeal swab specimens and should not be used as a sole basis for treatment. Nasal washings and aspirates are unacceptable for Xpert Xpress SARS-CoV-2/FLU/RSV testing.  Fact Sheet for Patients: BloggerCourse.com  Fact Sheet for Healthcare Providers: SeriousBroker.it  This test is not yet approved or cleared by the [place] FDA and has been authorized for detection and/or diagnosis of SARS-CoV-2 by FDA under an Emergency Use Authorization (EUA). This EUA will remain in effect (meaning this test can be  used) for the duration of the COVID-19 declaration under Section 564(b)(1) of the Act, 21 U.S.C. section 360bbb-3(b)(1), unless the authorization is terminated or revoked.  Performed at [facility], [address], [place], [place] [address]         Radiology Studies: DG CHEST PORT 1 VIEW  Result Date: 03/10/2020 CLINICAL DATA:  COVID positive 03/05/2020, rash and fatigue EXAM: PORTABLE CHEST 1 VIEW COMPARISON:  Radiograph 03/05/2020 FINDINGS: Increased attenuation towards the lung bases likely a combination of breast tissue and body habitus. Accounting for body habitus, the lungs are clear. No consolidation, features of edema, pneumothorax, or effusion. Pulmonary vascularity is normally  distributed. The cardiomediastinal contours are unremarkable. No acute osseous or soft tissue abnormality. IMPRESSION: No acute cardiopulmonary abnormality. Electronically Signed   By: Kreg Shropshire M.D.   On: 03/10/2020 19:40   DG ESOPHAGUS W SINGLE CM (SOL OR THIN BA)  Result Date: 03/11/2020 CLINICAL DATA:  Dysphagia. EXAM: ESOPHOGRAM/BARIUM SWALLOW TECHNIQUE: Single contrast examination was performed using  thin barium. FLUOROSCOPY TIME:  Fluoroscopy Time:  2 minutes and 30 seconds Radiation Exposure Index (if provided by the fluoroscopic device): 44.3 mGy fall Number of Acquired Spot Images: 0 COMPARISON:  None. FINDINGS: Focused, single-contrast exam performed. Limitations secondary to patient immobility. Evaluation of esophageal peristalsis demonstrates suggestion of mild stasis in the lower esophagus. Full column evaluation of the esophagus demonstrates no persistent narrowing or stricture. No hiatal hernia. A 13 mm barium tablet passes promptly. IMPRESSION: Focused, single-contrast exam performed with the patient in LPO position. No esophageal narrowing or stricture. Possible mild nonspecific esophageal dysmotility. Please note that the hypopharyngeal region is not well evaluated secondary to technique and patient immobility. Electronically Signed   By: Jeronimo Greaves M.D.   On: 03/11/2020 10:11        Scheduled Meds:  fluconazole  100 mg Oral QHS   silver sulfADIAZINE   Topical BID   sodium chloride flush  10-40 mL Intracatheter Q12H   Continuous Infusions:   LOS: 7 days     Jacquelin Hawking, MD [facility] 03/12/2020, 7:59 AM  If 7PM-7AM, please contact night-coverage [email or web address]

## 2020-03-12 NOTE — Consult Note (Signed)
Referring Provider: Dr. Lacretia Nicks Primary Care Physician:  Barbette Merino, NP Primary Gastroenterologist: None (unassigned)  Reason for Consultation: Dysphagia  HPI: Haley Sandoval is a 20 y.o. female who reports a several week history of dysphagia, which is in association with rhabdomyolysis/myositis and COVID positivity.  The dysphagia is primarily to solids, and leads to transient stoppage of food in the esophagus, sometimes requiring regurgitation, unless she drinks liquids to wash it down.  She also has a sense of increased secretions in her throat and some sense of things getting stuck in her throat.  However, she did not mention any symptoms of odynophagia.   A barium swallow yesterday showed no evidence of a stricture, possibly some dysmotility with stasis of the barium in the distal esophagus.  The barium tablet passed okay into the stomach.   Past Medical History:  Diagnosis Date  . Abscess   . Rhabdomyolysis   . Seasonal allergies     Past Surgical History:  Procedure Laterality Date  . WISDOM TOOTH EXTRACTION      Prior to Admission medications   Medication Sig Start Date End Date Taking? Authorizing Provider  furosemide (LASIX) 20 MG tablet Take 1 tablet (20 mg total) by mouth daily for 7 days. 02/16/20 02/23/20 Yes Adhikari, Willia Craze, MD  magnesium oxide (MAG-OX) 400 (241.3 Mg) MG tablet Take 1 tablet (400 mg total) by mouth daily. 02/16/20  Yes Burnadette Pop, MD    Current Facility-Administered Medications  Medication Dose Route Frequency Provider Last Rate Last Admin  . fluconazole (DIFLUCAN) tablet 100 mg  100 mg Oral QHS Herby Abraham, RPH   100 mg at 03/11/20 2034  . HYDROcodone-acetaminophen (NORCO) 7.5-325 MG per tablet 1 tablet  1 tablet Oral Q6H PRN Marinda Elk, MD   1 tablet at 03/11/20 2034  . lip balm (CARMEX) ointment   Topical PRN Narda Bonds, MD   1 application at 03/11/20 1359  . ondansetron (ZOFRAN) tablet 4 mg  4 mg Oral Q6H PRN  Opyd, Lavone Neri, MD       Or  . ondansetron (ZOFRAN) injection 4 mg  4 mg Intravenous Q6H PRN Opyd, Lavone Neri, MD      . senna-docusate (Senokot-S) tablet 1 tablet  1 tablet Oral QHS PRN Opyd, Lavone Neri, MD      . silver sulfADIAZINE (SILVADENE) 1 % cream   Topical BID Opyd, Lavone Neri, MD   Given at 03/11/20 2034  . sodium chloride flush (NS) 0.9 % injection 10-40 mL  10-40 mL Intracatheter Q12H Opyd, Lavone Neri, MD   10 mL at 03/11/20 1400  . sodium chloride flush (NS) 0.9 % injection 10-40 mL  10-40 mL Intracatheter PRN Opyd, Lavone Neri, MD      . traMADol (ULTRAM) tablet 50 mg  50 mg Oral Q8H PRN Opyd, Lavone Neri, MD   50 mg at 03/11/20 1159    Allergies as of 03/04/2020  . (No Known Allergies)    Family History  Problem Relation Age of Onset  . Hypertension Mother   . Colon cancer Neg Hx   . Colon polyps Neg Hx   . Liver disease Neg Hx   . Sickle cell anemia Neg Hx     Social History   Socioeconomic History  . Marital status: Single    Spouse name: Not on file  . Number of children: Not on file  . Years of education: Not on file  . Highest education level: Not on file  Occupational History  . Not on file  Tobacco Use  . Smoking status: Never Smoker  . Smokeless tobacco: Never Used  Vaping Use  . Vaping Use: Never used  Substance and Sexual Activity  . Alcohol use: Not Currently  . Drug use: Not Currently  . Sexual activity: Yes  Other Topics Concern  . Not on file  Social History Narrative  . Not on file   Social Determinants of Health   Financial Resource Strain: Not on file  Food Insecurity: No Food Insecurity  . Worried About Programme researcher, broadcasting/film/video in the Last Year: Never true  . Ran Out of Food in the Last Year: Never true  Transportation Needs: No Transportation Needs  . Lack of Transportation (Medical): No  . Lack of Transportation (Non-Medical): No  Physical Activity: Not on file  Stress: Not on file  Social Connections: Not on file  Intimate Partner  Violence: Not on file      Physical Exam: Vital signs in last 24 hours: Temp:  [98.1 F (36.7 C)-98.8 F (37.1 C)] 98.8 F (37.1 C) (01/21 0508) Pulse Rate:  [107-110] 107 (01/21 0508) Resp:  [18] 18 (01/21 0508) BP: (114-138)/(66-73) 114/66 (01/21 0508) SpO2:  [99 %-100 %] 99 % (01/21 0508) Last BM Date: 03/10/20  This is a pleasant, substantially overweight African-American female seated in a bedside chair, with somewhat "jerky" probably movements, for example moving items on her tray, in no distress.  Vocal quality is normal.  Intake/Output from previous day: 01/20 0701 - 01/21 0700 In: 240 [P.O.:240] Out: 2600 [Urine:2600] Intake/Output this shift: Total I/O In: -  Out: 1000 [Urine:1000]  Lab Results: Recent Labs    03/10/20 1620 03/12/20 0419  WBC 18.0* 16.4*  HGB 11.1* 11.3*  HCT 34.6* 34.9*  PLT 286 287   BMET Recent Labs    03/10/20 0357 03/11/20 0427 03/12/20 0419  NA 140 139 141  K 3.9 4.0 4.0  CL 109 104 103  CO2 23 26 28   GLUCOSE 102* 86 90  BUN 22* 15 15  CREATININE 0.48 0.42* 0.50  CALCIUM 8.3* 8.7* 8.7*   LFT Recent Labs    03/10/20 0357  PROT 4.9*  ALBUMIN 2.3*  AST 119*  ALT 46*  ALKPHOS 32*  BILITOT 0.3  BILIDIR 0.1  IBILI 0.2*   PT/INR No results for input(s): LABPROT, INR in the last 72 hours.   The patient's CK level, which was 4800 on admission, has fallen progressively and is now 2100.  Studies/Results: DG CHEST PORT 1 VIEW  Result Date: 03/10/2020 CLINICAL DATA:  COVID positive 03/05/2020, rash and fatigue EXAM: PORTABLE CHEST 1 VIEW COMPARISON:  Radiograph 03/05/2020 FINDINGS: Increased attenuation towards the lung bases likely a combination of breast tissue and body habitus. Accounting for body habitus, the lungs are clear. No consolidation, features of edema, pneumothorax, or effusion. Pulmonary vascularity is normally distributed. The cardiomediastinal contours are unremarkable. No acute osseous or soft tissue  abnormality. IMPRESSION: No acute cardiopulmonary abnormality. Electronically Signed   By: 03/07/2020 M.D.   On: 03/10/2020 19:40   DG ESOPHAGUS W SINGLE CM (SOL OR THIN BA)  Result Date: 03/11/2020 CLINICAL DATA:  Dysphagia. EXAM: ESOPHOGRAM/BARIUM SWALLOW TECHNIQUE: Single contrast examination was performed using  thin barium. FLUOROSCOPY TIME:  Fluoroscopy Time:  2 minutes and 30 seconds Radiation Exposure Index (if provided by the fluoroscopic device): 44.3 mGy fall Number of Acquired Spot Images: 0 COMPARISON:  None. FINDINGS: Focused, single-contrast exam performed. Limitations secondary to  patient immobility. Evaluation of esophageal peristalsis demonstrates suggestion of mild stasis in the lower esophagus. Full column evaluation of the esophagus demonstrates no persistent narrowing or stricture. No hiatal hernia. A 13 mm barium tablet passes promptly. IMPRESSION: Focused, single-contrast exam performed with the patient in LPO position. No esophageal narrowing or stricture. Possible mild nonspecific esophageal dysmotility. Please note that the hypopharyngeal region is not well evaluated secondary to technique and patient immobility. Electronically Signed   By: Jeronimo Greaves M.D.   On: 03/11/2020 10:11    Impression: 1.  Esophageal dysmotility with associated mild solid food dysphagia 2.  Rhabdomyolysis/myositis  Discussion: The patient does not appear to have any anatomic defects of the esophagus, such as stricture.  Infectious processes seem unlikely, inasmuch as she made no mention of esophageal pain or discomfort.  Therefore, I think this is probably a motility problem, which could be primary or, in view of its recent onset, more likely related to her myositis, which might be affecting the muscles of the proximal esophagus and pharynx to account for her symptoms.  Plan: 1.  I anticipate that the patient's dysphagia symptoms will resolve as her myositis improves 2.  I recommended that the  patient chop her food into very small pieces, and intersperse swallows of solid food with swigs of liquid 3.  I would advocate a trial of high-dose PPI therapy, Protonix 40mg  po bid 1/2 hr ac, to see if helps esophageal motility (acid reflux, which can sometimes be "silent," is one of several causes of secondary esophageal dysmotility); it might also help with her pharyngeal secretions, which can be a manifestation of acid reflux as well.  If her dysphagia symptoms resolve, she could try going off the Protonix, resuming it in the event of recurrent symptoms.  Conversely, if she does not benefit from the Protonix after 3 months of high-dose therapy, it is probably not the answer for her and it might as well be stopped at that point. 4.  I have given the patient my card and encouraged her to call my office to make an appointment if she has persistent symptoms and would like further evaluation and treatment. 5.  At this time, I do not think that endoscopic evaluation would be helpful, given the absence of esophageal stricturing or any mention of odynophagia. 6.  As an outpatient, consideration could be given to esophageal manometry if she does have persistent symptoms, although in general, I do not usually find that that study is particularly useful from the clinical management perspective.  I will sign off.  Please call me if you would like to discuss the patient's case.   LOS: 7 days   Bridgett Hattabaugh  03/12/2020, 11:01 AM   Pager [phone] If no answer or after 5 PM call [phone]

## 2020-03-12 NOTE — TOC Progression Note (Signed)
Transition of Care [facility]) - Progression Note    Patient Details  Name: Haley Sandoval MRN: [id number] Date of Birth: [birth year]  [facility] CM/SW Contact  Ida Rogue, [place] Phone Number: 03/12/2020, 2:56 PM  Clinical Narrative:  Was able to pair [name]'s insurance with a good payor source and set her up with [facility] for PT/OT services.  [place] needs address prior to d/c. TOC will continue to follow during the course of hospitalization.      Expected Discharge Plan: Home w Home Health Services Barriers to Discharge: No Barriers Identified  Expected Discharge Plan and Services Expected Discharge Plan: Home w Home Health Services   Discharge Planning Services: CM Consult   Living arrangements for the past 2 months: Apartment Expected Discharge Date:  (unknown)                                     Social Determinants of Health (SDOH) Interventions    Readmission Risk Interventions Readmission Risk Prevention Plan 02/04/2020  Transportation Screening Complete  PCP or Specialist Appt within 5-7 Days Complete  Home Care Screening Complete  Medication Review (RN CM) Complete  Some recent data might be hidden

## 2020-03-13 DIAGNOSIS — R601 Generalized edema: Secondary | ICD-10-CM | POA: Diagnosis not present

## 2020-03-13 DIAGNOSIS — R1310 Dysphagia, unspecified: Secondary | ICD-10-CM | POA: Diagnosis not present

## 2020-03-13 DIAGNOSIS — R6510 Systemic inflammatory response syndrome (SIRS) of non-infectious origin without acute organ dysfunction: Secondary | ICD-10-CM | POA: Diagnosis not present

## 2020-03-13 DIAGNOSIS — U071 COVID-19: Secondary | ICD-10-CM | POA: Diagnosis not present

## 2020-03-13 DIAGNOSIS — Z6841 Body Mass Index (BMI) 40.0 and over, adult: Secondary | ICD-10-CM | POA: Diagnosis not present

## 2020-03-13 DIAGNOSIS — M6282 Rhabdomyolysis: Secondary | ICD-10-CM | POA: Diagnosis not present

## 2020-03-13 DIAGNOSIS — D72829 Elevated white blood cell count, unspecified: Secondary | ICD-10-CM | POA: Diagnosis not present

## 2020-03-13 DIAGNOSIS — E8809 Other disorders of plasma-protein metabolism, not elsewhere classified: Secondary | ICD-10-CM | POA: Diagnosis not present

## 2020-03-13 DIAGNOSIS — E871 Hypo-osmolality and hyponatremia: Secondary | ICD-10-CM | POA: Diagnosis not present

## 2020-03-13 DIAGNOSIS — K224 Dyskinesia of esophagus: Secondary | ICD-10-CM | POA: Diagnosis not present

## 2020-03-13 DIAGNOSIS — M609 Myositis, unspecified: Secondary | ICD-10-CM | POA: Diagnosis not present

## 2020-03-13 DIAGNOSIS — E861 Hypovolemia: Secondary | ICD-10-CM | POA: Diagnosis not present

## 2020-03-13 LAB — CBC
HCT: 37 % (ref 36.0–46.0)
Hemoglobin: 11.9 g/dL — ABNORMAL LOW (ref 12.0–15.0)
MCH: 29.3 pg (ref 26.0–34.0)
MCHC: 32.2 g/dL (ref 30.0–36.0)
MCV: 91.1 fL (ref 80.0–100.0)
Platelets: 285 10*3/uL (ref 150–400)
RBC: 4.06 MIL/uL (ref 3.87–5.11)
RDW: 16.9 % — ABNORMAL HIGH (ref 11.5–15.5)
WBC: 16.4 10*3/uL — ABNORMAL HIGH (ref 4.0–10.5)
nRBC: 0 % (ref 0.0–0.2)

## 2020-03-13 LAB — COMPREHENSIVE METABOLIC PANEL
ALT: 34 U/L (ref 0–44)
AST: 87 U/L — ABNORMAL HIGH (ref 15–41)
Albumin: 2.7 g/dL — ABNORMAL LOW (ref 3.5–5.0)
Alkaline Phosphatase: 26 U/L — ABNORMAL LOW (ref 38–126)
Anion gap: 7 (ref 5–15)
BUN: 13 mg/dL (ref 6–20)
CO2: 31 mmol/L (ref 22–32)
Calcium: 8.7 mg/dL — ABNORMAL LOW (ref 8.9–10.3)
Chloride: 99 mmol/L (ref 98–111)
Creatinine, Ser: 0.52 mg/dL (ref 0.44–1.00)
GFR, Estimated: >60 mL/min (ref >60)
Glucose, Bld: 89 mg/dL (ref 70–99)
Potassium: 3.9 mmol/L (ref 3.5–5.1)
Sodium: 137 mmol/L (ref 135–145)
Total Bilirubin: 0.6 mg/dL (ref 0.3–1.2)
Total Protein: 5.5 g/dL — ABNORMAL LOW (ref 6.5–8.1)

## 2020-03-13 LAB — CK: Total CK: 1551 U/L — ABNORMAL HIGH (ref 38–234)

## 2020-03-13 MED ORDER — FUROSEMIDE 10 MG/ML IJ SOLN
40.0000 mg | Freq: Once | INTRAMUSCULAR | Status: AC
  Administered 2020-03-13: 40 mg via INTRAVENOUS
  Filled 2020-03-13: qty 4

## 2020-03-13 NOTE — Progress Notes (Signed)
  Mews Score: Yellow. No change in prior assessment. Patient is asymptomatic.    03/13/20 2206  Assess: MEWS Score  Temp 98.8 F (37.1 C)  BP (!) 106/54  Pulse Rate (!) 118  Resp 14  SpO2 100 %  Assess: MEWS Score  MEWS Temp 0  MEWS Systolic 0  MEWS Pulse 2  MEWS RR 0  MEWS LOC 0  MEWS Score 2  MEWS Score Color Yellow  Assess: if the MEWS score is Yellow or Red  Were vital signs taken at a resting state? Yes  Focused Assessment No change from prior assessment  Early Detection of Sepsis Score *See Row Information* Low  MEWS guidelines implemented *See Row Information* No, previously yellow, continue vital signs every 4 hours  Treat  Pain Scale 0-10  Pain Score 0  Document  Patient Outcome Other (Comment) (patient is stable, no interventions done)  Progress note created (see row info) Yes

## 2020-03-13 NOTE — Progress Notes (Signed)
   03/13/20 1624  Assess: MEWS Score  Temp 99.6 F (37.6 C)  BP (!) 128/56  Pulse Rate (!) 126  Resp 20  Level of Consciousness Alert  SpO2 100 %  O2 Device Room Air  Assess: MEWS Score  MEWS Temp 0  MEWS Systolic 0  MEWS Pulse 2  MEWS RR 0  MEWS LOC 0  MEWS Score 2  MEWS Score Color Yellow  Notify: Provider  Provider Name/Title Dr. Caleb Popp  Date Provider Notified 03/13/20  Time Provider Notified 1630  Notification Type Page  Notification Reason Other (Comment) (elevated HR)  Response See new orders  Date of Provider Response 03/13/20  Time of Provider Response 1635  Notify: Rapid Response  Name of Rapid Response RN Notified  (No need to notify rapid response patient stable)  Date Rapid Response Notified  (N/a)  Time Rapid Response Notified  (n/a)

## 2020-03-13 NOTE — Progress Notes (Signed)
PROGRESS NOTE    Haley Sandoval  AOZ:[id number] DOB: 2000/03/19 DOA: 03/04/2020 PCP: Barbette Merino, NP   Brief Narrative: Haley Sandoval is a 20 y.o. 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 Patient presented secondary to recurrent muscle aches with evidence of significantly elevated CK on admission. IV fluids initiated.   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 On admission, discussion with rheumatology at [facility] recommended IVF, muscle biopsy and EMG; no beds were available at that time. Patient has been receiving IV fluids for treatment with slowly downtrending CK. IV fluids stopped secondary to patient complaint of swelling. -Standing weights/strict in and outs  Elevated AST/ALT Likely secondary to myositis and is improving.  Anasarca Secondary to IV fluid resuscitation in setting of hypoalbuminemia. Weight on admission of 297 lbs. Peak weight of 310 lbs and trending down -Continue Lasix IV today -Daily weights  Dysphagia Relatively new issue that started about 3 weeks ago per patient. No associated odynophagia. Barium swallow significant for non-specific dysmotility issue. -GI recommendations: finely chopped food, chew food with liquid, outpatient follow-up as needed, Protonix 40 mg BID x 3 months  Hyponatremia Given IV fluids with resolution.  SIRS Present on admission. At time of admission it was not thought to be secondary to infection.  Chest rash Not infected. Per patient this is related to EKG lead stickers. -Continue wound care  Leukocytosis Patient received a couple of days of prednisone which may be contributing. No fevers. Stable.  Morbid obesity Body mass index is 48.39 kg/m.    DVT prophylaxis: SCDs Code Status:   Code Status:  Full Code Family Communication: None at bedside Disposition Plan: Discharge likely in 2-3 days. Patient declining SNF. Patient currently homeless secondary to recent death of mother   Consultants:   [facility]  Procedures:   None  Antimicrobials:  Fluconazole   Subjective: Pain is improving. No issues overnight.  Objective: Vitals:   03/12/20 1727 03/12/20 2005 03/13/20 0410 03/13/20 0413  BP:  132/90  113/69  Pulse:  100  100  Resp:  18  20  Temp:  99 F (37.2 C)  99.3 F (37.4 C)  TempSrc:  Oral  Oral  SpO2:  100%  100%  Weight: (!) 138.5 kg  131.9 kg   Height:        Intake/Output Summary (Last 24 hours) at 03/13/2020 1039 Last data filed at 03/13/2020 0900 Gross per 24 hour  Intake 240 ml  Output 1815 ml  Net -1575 ml   Filed Weights   03/11/20 0600 03/12/20 1727 03/13/20 0410  Weight: (!) 136.4 kg (!) 138.5 kg 131.9 kg    Examination:  General exam: Appears calm and comfortable Respiratory system: Clear to auscultation. Respiratory effort normal. Cardiovascular system: S1 & S2 heard, Regular rhythm with fast rate. No murmurs, rubs, gallops or clicks. Gastrointestinal system: Abdomen is nondistended, soft and nontender. No organomegaly or masses felt. Normal bowel sounds heard. Central nervous system: Alert and oriented. No focal neurological deficits. Musculoskeletal: 1+ pitting edema of all extremities but seems slightly worse in LE. No calf tenderness Skin: No cyanosis. Psychiatry: Judgement and insight appear normal. Flat affect     Data Reviewed: I have personally reviewed following labs and imaging studies  CBC Lab Results  Component Value Date  WBC 16.4 (H) 03/13/2020   RBC 4.06 03/13/2020   HGB 11.9 (L) 03/13/2020   HCT 37.0 03/13/2020   MCV 91.1 03/13/2020   MCH 29.3 03/13/2020   PLT 285 03/13/2020   MCHC 32.2 03/13/2020   RDW 16.9 (H) 03/13/2020   LYMPHSABS 1.3 03/10/2020   MONOABS 1.3 (H) 03/10/2020   EOSABS 0.0 03/10/2020    BASOSABS 0.1 03/10/2020     Last metabolic panel Lab Results  Component Value Date   NA 137 03/13/2020   K 3.9 03/13/2020   CL 99 03/13/2020   CO2 31 03/13/2020   BUN 13 03/13/2020   CREATININE 0.52 03/13/2020   GLUCOSE 89 03/13/2020   GFRNONAA >60 03/13/2020   GFRAA 154 01/30/2020   CALCIUM 8.7 (L) 03/13/2020   PHOS 4.2 02/11/2020   PROT 5.5 (L) 03/13/2020   ALBUMIN 2.7 (L) 03/13/2020   LABGLOB 2.7 01/30/2020   AGRATIO 1.2 01/30/2020   BILITOT 0.6 03/13/2020   ALKPHOS 26 (L) 03/13/2020   AST 87 (H) 03/13/2020   ALT 34 03/13/2020   ANIONGAP 7 03/13/2020    CBG (last 3)  No results for input(s): GLUCAP in the last 72 hours.   GFR: Estimated Creatinine Clearance: 154.1 mL/min (by C-G formula based on SCr of 0.52 mg/dL).  Coagulation Profile: No results for input(s): INR, PROTIME in the last 168 hours.  Recent Results (from the past 240 hour(s))  Resp Panel by RT-PCR (Flu A&B, Covid) Nasopharyngeal Swab     Status: Abnormal   Collection Time: 03/05/20 12:13 AM   Specimen: Nasopharyngeal Swab; Nasopharyngeal(NP) swabs in vial transport medium  Result Value Ref Range Status   SARS Coronavirus 2 by RT PCR POSITIVE (A) NEGATIVE Final    Comment: RESULT CALLED TO, READ BACK BY AND VERIFIED WITH: SARA, RN @ 0252 ON 03/05/20 C VARNER (NOTE) SARS-CoV-2 target nucleic acids are DETECTED.  The SARS-CoV-2 RNA is generally detectable in upper respiratory specimens during the acute phase of infection. Positive results are indicative of the presence of the identified virus, but do not rule out bacterial infection or co-infection with other pathogens not detected by the test. Clinical correlation with patient history and other diagnostic information is necessary to determine patient infection status. The expected result is Negative.  Fact Sheet for Patients: BloggerCourse.com  Fact Sheet for Healthcare  Providers: SeriousBroker.it  This test is not yet approved or cleared by the [place] FDA and  has been authorized for detection and/or diagnosis of SARS-CoV-2 by FDA under an Emergency Use Authorization (EUA).  This EUA will remain in effect (meaning this test can  be used) for the duration of  the COVID-19 declaration under Section 564(b)(1) of the Act, 21 U.S.C. section 360bbb-3(b)(1), unless the authorization is terminated or revoked sooner.     Influenza A by PCR NEGATIVE NEGATIVE Final   Influenza B by PCR NEGATIVE NEGATIVE Final    Comment: (NOTE) The Xpert Xpress SARS-CoV-2/FLU/RSV plus assay is intended as an aid in the diagnosis of influenza from Nasopharyngeal swab specimens and should not be used as a sole basis for treatment. Nasal washings and aspirates are unacceptable for Xpert Xpress SARS-CoV-2/FLU/RSV testing.  Fact Sheet for Patients: BloggerCourse.com  Fact Sheet for Healthcare Providers: SeriousBroker.it  This test is not yet approved or cleared by the [place] FDA and has been authorized for detection and/or diagnosis of SARS-CoV-2 by FDA under an Emergency Use Authorization (EUA). This EUA will remain in effect (meaning this test can be used) for  the duration of the COVID-19 declaration under Section 564(b)(1) of the Act, 21 U.S.C. section 360bbb-3(b)(1), unless the authorization is terminated or revoked.  Performed at [facility], [address] [place], [place] [address]         Radiology Studies: No results found.      Scheduled Meds: . fluconazole  100 mg Oral QHS  . pantoprazole  40 mg Oral BID  . silver sulfADIAZINE   Topical BID  . sodium chloride flush  10-40 mL Intracatheter Q12H   Continuous Infusions:   LOS: 8 days     Jacquelin Hawking, MD [facility] 03/13/2020, 10:39 AM  If 7PM-7AM, please contact  night-coverage [email or web address]

## 2020-03-13 NOTE — Progress Notes (Signed)
Patient had an extremely hard time trying to get out of bed for a standing weight assist needed x 2.

## 2020-03-14 DIAGNOSIS — M609 Myositis, unspecified: Secondary | ICD-10-CM | POA: Diagnosis not present

## 2020-03-14 DIAGNOSIS — M6282 Rhabdomyolysis: Secondary | ICD-10-CM | POA: Diagnosis not present

## 2020-03-14 DIAGNOSIS — R601 Generalized edema: Secondary | ICD-10-CM

## 2020-03-14 DIAGNOSIS — R1310 Dysphagia, unspecified: Secondary | ICD-10-CM | POA: Diagnosis not present

## 2020-03-14 LAB — CBC
HCT: 35.8 % — ABNORMAL LOW (ref 36.0–46.0)
Hemoglobin: 11.3 g/dL — ABNORMAL LOW (ref 12.0–15.0)
MCH: 29 pg (ref 26.0–34.0)
MCHC: 31.6 g/dL (ref 30.0–36.0)
MCV: 91.8 fL (ref 80.0–100.0)
Platelets: 284 10*3/uL (ref 150–400)
RBC: 3.9 MIL/uL (ref 3.87–5.11)
RDW: 17 % — ABNORMAL HIGH (ref 11.5–15.5)
WBC: 15.4 10*3/uL — ABNORMAL HIGH (ref 4.0–10.5)
nRBC: 0 % (ref 0.0–0.2)

## 2020-03-14 LAB — CK: Total CK: 1029 U/L — ABNORMAL HIGH (ref 38–234)

## 2020-03-14 MED ORDER — FUROSEMIDE 10 MG/ML IJ SOLN
40.0000 mg | Freq: Once | INTRAMUSCULAR | Status: AC
  Administered 2020-03-14: 40 mg via INTRAVENOUS
  Filled 2020-03-14: qty 4

## 2020-03-14 NOTE — Plan of Care (Signed)
  Problem: Education: Goal: Knowledge of General Education information will improve Description: Including pain rating scale, medication(s)/side effects and non-pharmacologic comfort measures Outcome: Progressing   Problem: Health Behavior/Discharge Planning: Goal: Ability to manage health-related needs will improve Outcome: Progressing   Problem: Clinical Measurements: Goal: Will remain free from infection Outcome: Progressing   Problem: Activity: Goal: Risk for activity intolerance will decrease Outcome: Progressing   Problem: Nutrition: Goal: Adequate nutrition will be maintained Outcome: Progressing   Problem: Coping: Goal: Level of anxiety will decrease Outcome: Progressing   Problem: Pain Managment: Goal: General experience of comfort will improve Outcome: Progressing   Problem: Skin Integrity: Goal: Risk for impaired skin integrity will decrease Outcome: Progressing   Problem: Education: Goal: Knowledge of risk factors and measures for prevention of condition will improve Outcome: Progressing   Problem: Coping: Goal: Psychosocial and spiritual needs will be supported Outcome: Progressing   Problem: Respiratory: Goal: Will maintain a patent airway Outcome: Progressing Goal: Complications related to the disease process, condition or treatment will be avoided or minimized Outcome: Progressing

## 2020-03-14 NOTE — Progress Notes (Signed)
   03/14/20 2023  Assess: MEWS Score  Temp 99.5 F (37.5 C)  BP (!) 110/55  Pulse Rate (!) 120  Resp 15  Level of Consciousness Alert  SpO2 100 %  O2 Device Room Air  Patient Activity (if Appropriate) In bed  Assess: if the MEWS score is Yellow or Red  Were vital signs taken at a resting state? Yes  Focused Assessment No change from prior assessment  Early Detection of Sepsis Score *See Row Information* Low  MEWS guidelines implemented *See Row Information* No, previously yellow, continue vital signs every 4 hours  Treat  Pain Scale 0-10  Pain Score 0  Faces Pain Scale 0

## 2020-03-14 NOTE — Progress Notes (Signed)
PROGRESS NOTE    IRMGARD Sandoval  FIE:[id number] DOB: 01/10/2001 DOA: 03/04/2020 PCP: Barbette Merino, NP   Brief Narrative: Haley Sandoval is a 20 y.o. 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 Patient presented secondary to recurrent muscle aches with evidence of significantly elevated CK on admission. IV fluids initiated.   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 On admission, discussion with rheumatology at [facility] recommended IVF, muscle biopsy and EMG; no beds were available at that time. Patient has been receiving IV fluids for treatment with slowly downtrending CK. IV fluids stopped secondary to patient complaint of swelling. -Standing weights/strict in and outs -Will request muscle biopsy from general surgery tomorrow if able  Elevated AST/ALT Likely secondary to myositis and is improving.  Anasarca Secondary to IV fluid resuscitation in setting of hypoalbuminemia. Weight on admission of 297 lbs. Peak weight of 310 lbs and trending down. Weight of 288 lbs today. Although patient states her swelling is likely from IV fluids, she is now below admission weight. She has an albumin of 2.7 which is likely main driver for anasarca which was likely worsened with significant IV fluid for above. -Continue IV lasix -Daily weights  Dysphagia Relatively new issue that started about 3 weeks ago per patient. No associated odynophagia. Barium swallow significant for non-specific dysmotility issue. Per GI, possibly related to ?myositis -GI recommendations: finely chopped food, chew food with liquid, outpatient follow-up as needed, Protonix 40 mg BID x 3 months  Hyponatremia Given IV fluids with resolution.  SIRS Present on admission. At time of admission it was not thought  to be secondary to infection.  Chest rash Not infected. Per patient this is related to EKG lead stickers. -Continue wound care  Leukocytosis Patient received a couple of days of prednisone which may be contributing. No fevers. Stable.  Morbid obesity Body mass index is 46.58 kg/m.    DVT prophylaxis: SCDs Code Status:   Code Status: Full Code Family Communication: None at bedside Disposition Plan: Discharge likely in 1-3 days. Patient declining SNF. Patient currently homeless secondary to recent death of mother however states she will have a place to go.   Consultants:   [facility]  Procedures:   None  Antimicrobials:  Fluconazole   Subjective: Still feels swollen. She states her swelling is because of the fluids she received this admission.  Objective: Vitals:   03/13/20 1821 03/13/20 2206 03/14/20 0200 03/14/20 0555  BP: 138/83 (!) 106/54 99/63 119/63  Pulse: (!) 131 (!) 118 (!) 111 (!) 107  Resp: 19 14 16 16   Temp: 98 F (36.7 C) 98.8 F (37.1 C) 99.5 F (37.5 C) 98.2 F (36.8 C)  TempSrc: Oral Oral Oral Oral  SpO2: 100% 100% 100% 97%  Weight:    130.9 kg  Height:        Intake/Output Summary (Last 24 hours) at 03/14/2020 1228 Last data filed at 03/14/2020 03/16/2020 Gross per 24 hour  Intake 720 ml  Output 1950 ml  Net -1230 ml   Filed Weights   03/13/20 0410 03/13/20 1415 03/14/20 0555  Weight: 131.9 kg 131.4 kg 130.9 kg    Examination:  General exam: Appears calm and comfortable Respiratory system: Clear to auscultation. Respiratory effort normal. Cardiovascular system: S1 & S2 heard, RRR. No murmurs,  rubs, gallops or clicks. Gastrointestinal system: Abdomen is nondistended, soft and nontender. No organomegaly or masses felt. Normal bowel sounds heard. Central nervous system: Alert and oriented. No focal neurological deficits. Musculoskeletal: 1+ pitting in all extremities. No calf tenderness Skin: No cyanosis. No rashes Psychiatry: Judgement and  insight appear normal. Flat affect.     Data Reviewed: I have personally reviewed following labs and imaging studies  CBC Lab Results  Component Value Date   WBC 15.4 (H) 03/14/2020   RBC 3.90 03/14/2020   HGB 11.3 (L) 03/14/2020   HCT 35.8 (L) 03/14/2020   MCV 91.8 03/14/2020   MCH 29.0 03/14/2020   PLT 284 03/14/2020   MCHC 31.6 03/14/2020   RDW 17.0 (H) 03/14/2020   LYMPHSABS 1.3 03/10/2020   MONOABS 1.3 (H) 03/10/2020   EOSABS 0.0 03/10/2020   BASOSABS 0.1 03/10/2020     Last metabolic panel Lab Results  Component Value Date   NA 137 03/13/2020   K 3.9 03/13/2020   CL 99 03/13/2020   CO2 31 03/13/2020   BUN 13 03/13/2020   CREATININE 0.52 03/13/2020   GLUCOSE 89 03/13/2020   GFRNONAA >60 03/13/2020   GFRAA 154 01/30/2020   CALCIUM 8.7 (L) 03/13/2020   PHOS 4.2 02/11/2020   PROT 5.5 (L) 03/13/2020   ALBUMIN 2.7 (L) 03/13/2020   LABGLOB 2.7 01/30/2020   AGRATIO 1.2 01/30/2020   BILITOT 0.6 03/13/2020   ALKPHOS 26 (L) 03/13/2020   AST 87 (H) 03/13/2020   ALT 34 03/13/2020   ANIONGAP 7 03/13/2020    CBG (last 3)  No results for input(s): GLUCAP in the last 72 hours.   GFR: Estimated Creatinine Clearance: 155.7 mL/min (by C-G formula based on SCr of 0.52 mg/dL).  Coagulation Profile: No results for input(s): INR, PROTIME in the last 168 hours.  Recent Results (from the past 240 hour(s))  Resp Panel by RT-PCR (Flu A&B, Covid) Nasopharyngeal Swab     Status: Abnormal   Collection Time: 03/05/20 12:13 AM   Specimen: Nasopharyngeal Swab; Nasopharyngeal(NP) swabs in vial transport medium  Result Value Ref Range Status   SARS Coronavirus 2 by RT PCR POSITIVE (A) NEGATIVE Final    Comment: RESULT CALLED TO, READ BACK BY AND VERIFIED WITH: SARA, RN @ 0252 ON 03/05/20 C VARNER (NOTE) SARS-CoV-2 target nucleic acids are DETECTED.  The SARS-CoV-2 RNA is generally detectable in upper respiratory specimens during the acute phase of infection. Positive results  are indicative of the presence of the identified virus, but do not rule out bacterial infection or co-infection with other pathogens not detected by the test. Clinical correlation with patient history and other diagnostic information is necessary to determine patient infection status. The expected result is Negative.  Fact Sheet for Patients: BloggerCourse.com  Fact Sheet for Healthcare Providers: SeriousBroker.it  This test is not yet approved or cleared by the [place] FDA and  has been authorized for detection and/or diagnosis of SARS-CoV-2 by FDA under an Emergency Use Authorization (EUA).  This EUA will remain in effect (meaning this test can  be used) for the duration of  the COVID-19 declaration under Section 564(b)(1) of the Act, 21 U.S.C. section 360bbb-3(b)(1), unless the authorization is terminated or revoked sooner.     Influenza A by PCR NEGATIVE NEGATIVE Final   Influenza B by PCR NEGATIVE NEGATIVE Final    Comment: (NOTE) The Xpert Xpress SARS-CoV-2/FLU/RSV plus assay is intended as an aid in the diagnosis of influenza from Nasopharyngeal swab specimens and  should not be used as a sole basis for treatment. Nasal washings and aspirates are unacceptable for Xpert Xpress SARS-CoV-2/FLU/RSV testing.  Fact Sheet for Patients: BloggerCourse.com  Fact Sheet for Healthcare Providers: SeriousBroker.it  This test is not yet approved or cleared by the [place] FDA and has been authorized for detection and/or diagnosis of SARS-CoV-2 by FDA under an Emergency Use Authorization (EUA). This EUA will remain in effect (meaning this test can be used) for the duration of the COVID-19 declaration under Section 564(b)(1) of the Act, 21 U.S.C. section 360bbb-3(b)(1), unless the authorization is terminated or revoked.  Performed at [facility], [address] [place], [place] [address]         Radiology Studies: No results found.      Scheduled Meds: . furosemide  40 mg Intravenous Once  . pantoprazole  40 mg Oral BID  . silver sulfADIAZINE   Topical BID  . sodium chloride flush  10-40 mL Intracatheter Q12H   Continuous Infusions:   LOS: 9 days     Jacquelin Hawking, MD [facility] 03/14/2020, 12:28 PM  If 7PM-7AM, please contact night-coverage [email or web address]

## 2020-03-14 NOTE — Progress Notes (Signed)
Patient out of bed to chair with moderate assist. Patient able to ambulated to chair with walker and standby assist.

## 2020-03-15 DIAGNOSIS — R1310 Dysphagia, unspecified: Secondary | ICD-10-CM | POA: Diagnosis not present

## 2020-03-15 DIAGNOSIS — M609 Myositis, unspecified: Secondary | ICD-10-CM | POA: Diagnosis not present

## 2020-03-15 DIAGNOSIS — M6282 Rhabdomyolysis: Secondary | ICD-10-CM | POA: Diagnosis not present

## 2020-03-15 LAB — CBC
HCT: 32.7 % — ABNORMAL LOW (ref 36.0–46.0)
Hemoglobin: 10.4 g/dL — ABNORMAL LOW (ref 12.0–15.0)
MCH: 29.5 pg (ref 26.0–34.0)
MCHC: 31.8 g/dL (ref 30.0–36.0)
MCV: 92.9 fL (ref 80.0–100.0)
Platelets: 261 10*3/uL (ref 150–400)
RBC: 3.52 MIL/uL — ABNORMAL LOW (ref 3.87–5.11)
RDW: 16.7 % — ABNORMAL HIGH (ref 11.5–15.5)
WBC: 11.9 10*3/uL — ABNORMAL HIGH (ref 4.0–10.5)
nRBC: 0 % (ref 0.0–0.2)

## 2020-03-15 LAB — BASIC METABOLIC PANEL
Anion gap: 15 (ref 5–15)
BUN: 16 mg/dL (ref 6–20)
CO2: 25 mmol/L (ref 22–32)
Calcium: 8.9 mg/dL (ref 8.9–10.3)
Chloride: 101 mmol/L (ref 98–111)
Creatinine, Ser: 0.69 mg/dL (ref 0.44–1.00)
GFR, Estimated: >60 mL/min (ref >60)
Glucose, Bld: 88 mg/dL (ref 70–99)
Potassium: 4 mmol/L (ref 3.5–5.1)
Sodium: 141 mmol/L (ref 135–145)

## 2020-03-15 LAB — CK: Total CK: 871 U/L — ABNORMAL HIGH (ref 38–234)

## 2020-03-15 MED ORDER — FUROSEMIDE 10 MG/ML IJ SOLN
40.0000 mg | Freq: Once | INTRAMUSCULAR | Status: AC
  Administered 2020-03-15: 40 mg via INTRAVENOUS
  Filled 2020-03-15: qty 4

## 2020-03-15 MED ORDER — ENSURE MAX PROTEIN PO LIQD
11.0000 [oz_av] | Freq: Every day | ORAL | Status: DC
  Administered 2020-03-15 – 2020-03-16 (×2): 11 [oz_av] via ORAL
  Filled 2020-03-15 (×3): qty 330

## 2020-03-15 MED ORDER — ADULT MULTIVITAMIN W/MINERALS CH
1.0000 | ORAL_TABLET | Freq: Every day | ORAL | Status: DC
  Administered 2020-03-15 – 2020-03-16 (×2): 1 via ORAL
  Filled 2020-03-15 (×2): qty 1

## 2020-03-15 NOTE — Progress Notes (Signed)
Pt stated "I do not want the cream on my chest its healing better now" RN charted in [facility] as not given.

## 2020-03-15 NOTE — Progress Notes (Signed)
   03/15/20 0403  Assess: MEWS Score  Temp 99.2 F (37.3 C)  BP 106/64  Pulse Rate (!) 112  Resp 16  Level of Consciousness Alert  SpO2 100 %  O2 Device Room Air  Patient Activity (if Appropriate) In bed  Assess: if the MEWS score is Yellow or Red  Were vital signs taken at a resting state? Yes  Focused Assessment No change from prior assessment  Early Detection of Sepsis Score *See Row Information* Low  MEWS guidelines implemented *See Row Information* No, previously yellow, continue vital signs every 4 hours  Treat  Pain Scale 0-10  Pain Score Asleep

## 2020-03-15 NOTE — Progress Notes (Signed)
   03/15/20 0017  Assess: MEWS Score  Temp 99.3 F (37.4 C)  BP 96/69  Pulse Rate (!) 122  Resp 17  Level of Consciousness Alert  SpO2 100 %  O2 Device Room Air  Patient Activity (if Appropriate) In bed  Assess: if the MEWS score is Yellow or Red  Were vital signs taken at a resting state? Yes  Focused Assessment No change from prior assessment  Early Detection of Sepsis Score *See Row Information* Low  MEWS guidelines implemented *See Row Information* No, previously yellow, continue vital signs every 4 hours  Treat  Pain Scale 0-10  Pain Score Asleep

## 2020-03-15 NOTE — Progress Notes (Signed)
Physical Therapy Treatment Patient Details Name: Haley Sandoval MRN: [id number] DOB: 02-Nov-2000 Today's Date: 03/15/2020    History of Present Illness Pt is 20 yo female admitted with rhabdomyolysis, COVID (+). Hx of pelvic inflammatory disease, R buttock abscess    PT Comments    Pt making excellent progress today. Reports she is feeling better as edema is decreasing.  She was able to progress to ambulation in room with and without RW. Did required min A for bed mobility.   Does fatigue easily with elevated HR requiring rest break.  Educated on home safety and recommendations.  Encouraged as much OOB activity with nursing as possible (up to bathroom, not relying on Purewick).  Pt has declined SNF at discharge and is not a CIR candidate due to COVID.  She reports she will be going to a first floor apt, no steps/curbs, and with 24 hr assist.     Follow Up Recommendations  Home health PT;Supervision/Assistance - 24 hour ((pt declined SNF, can't do CIR b/c COVID))     Equipment Recommendations  Rolling walker with 5" wheels (shower seat)    Recommendations for Other Services       Precautions / Restrictions Precautions Precautions: Fall Precaution Comments: monitor HR    Mobility  Bed Mobility Overal bed mobility: Needs Assistance Bed Mobility: Supine to Sit       Sit to supine: HOB elevated;Min assist   General bed mobility comments: Min A for both legs and HOB was elevated  Transfers Overall transfer level: Needs assistance Equipment used: Rolling walker (2 wheeled);None Transfers: Sit to/from Stand Sit to Stand: Min guard         General transfer comment: Pt requested the bed to be significantly elevated due to difficulty to rise.  From elevated bed did easily with min guard.  She then performed x 3 from regular height recliner with use of arms and still min guard.  Discussed starting to try from normal height bed due to need to rise from all levels of surfaces at  home.  Ambulation/Gait Ambulation/Gait assistance: Min guard Gait Distance (Feet): 50 Feet (50'x2) Assistive device: Rolling walker (2 wheeled);None Gait Pattern/deviations: Step-to pattern;Wide base of support Gait velocity: decreased   General Gait Details: Ambulated 2 bouts of 50' with seated rest break.  HR up to 132 bpm with walking requiring rest.  Ambulated the last 25' of the second bout without AD.  She did have wide BOS but was able to ambulate without locking knees.   Stairs             Wheelchair Mobility    Modified Rankin (Stroke Patients Only)       Balance Overall balance assessment: Needs assistance Sitting-balance support: No upper extremity supported Sitting balance-Leahy Scale: Good     Standing balance support: No upper extremity supported;Bilateral upper extremity supported Standing balance-Leahy Scale: Fair Standing balance comment: Started with RW but progressed ambulation to no AD and no use of UE                            Cognition Arousal/Alertness: Awake/alert Behavior During Therapy: Flat affect Overall Cognitive Status: Within Functional Limits for tasks assessed                                 General Comments: Reports doing much better with swelling going down  Exercises      General Comments General comments (skin integrity, edema, etc.): O2 sats stable on RA.  HR elevated to 132 bpm with activity requiring rest breaks.  She reports plan to d/c to 1st floor apartment that is handicap accessible.   Discussed recommendation for RW for energy conservation and safety.  Discussed possible BSC but reports toielt is tall with grab bar and close by bedroom. Recommended shower seat due to easily fatigues and for safety.  Continued to encourage AROM exercises and also recommended up/down to bathroom with nursing staff as much as possible (pt has Purewick b/c on lasix - encouraged to not rely on purewick).       Pertinent Vitals/Pain Pain Assessment: No/denies pain    Home Living                      Prior Function            PT Goals (current goals can now be found in the care plan section) Acute Rehab PT Goals Patient Stated Goal: to be independent again PT Goal Formulation: With patient Time For Goal Achievement: 03/20/20 Potential to Achieve Goals: Good Progress towards PT goals: Progressing toward goals    Frequency    Min 3X/week      PT Plan Discharge plan needs to be updated    Co-evaluation              AM-PAC PT "6 Clicks" Mobility   Outcome Measure  Help needed turning from your back to your side while in a flat bed without using bedrails?: A Little Help needed moving from lying on your back to sitting on the side of a flat bed without using bedrails?: A Little Help needed moving to and from a bed to a chair (including a wheelchair)?: A Little Help needed standing up from a chair using your arms (e.g., wheelchair or bedside chair)?: A Little Help needed to walk in hospital room?: A Little Help needed climbing 3-5 steps with a railing? : A Lot 6 Click Score: 17    End of Session Equipment Utilized During Treatment: Gait belt Activity Tolerance: Patient tolerated treatment well Patient left: with call bell/phone within reach;in chair Nurse Communication: Mobility status PT Visit Diagnosis: Muscle weakness (generalized) (M62.81);Pain     Time: 1410-1439 PT Time Calculation (min) (ACUTE ONLY): 29 min  Charges:  $Gait Training: 8-22 mins $Therapeutic Activity: 8-22 mins                     Anise Salvo, PT Acute Rehab Services Pager [phone] [facility] [phone]     Rayetta Humphrey 03/15/2020, 2:54 PM

## 2020-03-15 NOTE — Progress Notes (Addendum)
PROGRESS NOTE    Haley Sandoval  XAJ:[id number] DOB: [birth year] DOA: 03/04/2020 PCP: Barbette Merino, NP   Brief Narrative: Haley Sandoval is a 20 y.o. 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 Patient presented secondary to recurrent muscle aches with evidence of significantly elevated CK on admission. IV fluids initiated.   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 On admission, discussion with rheumatology at [facility] recommended IVF, muscle biopsy and EMG; no beds were available at that time. Patient has been receiving IV fluids for treatment with slowly downtrending CK. IV fluids stopped secondary to patient complaint of swelling. Patient declining biopsy on this admission and would prefer to follow-up with rheumatology prior to consideration of biopsy. -Standing weights/strict in and outs  Elevated AST/ALT Likely secondary to myositis and is improving.  Anasarca Secondary to IV fluid resuscitation in setting of hypoalbuminemia. Weight on admission of 297 lbs. Peak weight of 310 lbs and trending down. Weight of 288 lbs today. Although patient states her swelling is likely from IV fluids, she is now below admission weight. She has an albumin of 2.7 which is likely main driver for anasarca which was likely worsened with significant IV fluid for above. -Continue IV lasix -Daily weights -Dietary consult/recommendations  Dysphagia Relatively new issue that started about 3 weeks ago per patient. No associated odynophagia. Barium swallow significant for non-specific dysmotility issue. Per GI, possibly related to ?myositis -GI recommendations: finely chopped food, chew food with liquid, outpatient follow-up as needed, Protonix 40 mg BID x 3  months  Hyponatremia Given IV fluids with resolution.  SIRS Present on admission. At time of admission it was not thought to be secondary to infection.  Sinus tachycardia Stable. Outpatient follow-up.  Chest rash Not infected. Per patient this is related to EKG lead stickers. -Continue wound care  Leukocytosis Patient received a couple of days of prednisone which may be contributing. No fevers. Improved.  Morbid obesity Body mass index is 46.33 kg/m.    DVT prophylaxis: SCDs Code Status:   Code Status: Full Code Family Communication: None at bedside Disposition Plan: Discharge likely in 1 days. Patient declining SNF. Patient currently homeless secondary to recent death of mother however states she will have a place to go.   Consultants:   [facility]  Procedures:   None  Antimicrobials:  Fluconazole   Subjective: Continues to feel less swollen.  Objective: Vitals:   03/15/20 0017 03/15/20 0403 03/15/20 0945 03/15/20 1344  BP: 96/69 106/64 108/60 125/74  Pulse: (!) 122 (!) 112 (!) 117 (!) 119  Resp: 17 16 17    Temp: 99.3 F (37.4 C) 99.2 F (37.3 C) 97.6 F (36.4 C) 98.9 F (37.2 C)  TempSrc: Oral Oral Oral Oral  SpO2: 100% 100% 99% 98%  Weight:  130.2 kg    Height:        Intake/Output Summary (Last 24 hours) at 03/15/2020 1452 Last data filed at 03/15/2020 1436 Gross per 24 hour  Intake 240 ml  Output 2300 ml  Net -2060 ml   Filed Weights   03/13/20 1415 03/14/20 0555 03/15/20 0403  Weight: 131.4 kg 130.9 kg 130.2 kg    Examination:  General exam: Appears calm and comfortable Respiratory system: Clear to auscultation. Respiratory effort normal. Cardiovascular system:  S1 & S2 heard, tachycardia, normal rhytm. No murmurs, rubs, gallops or clicks. Gastrointestinal system: Abdomen is nondistended, soft and nontender. No organomegaly or masses felt. Normal bowel sounds heard. Central nervous system: Alert and oriented. No focal neurological  deficits. Musculoskeletal: Mild edema. No calf tenderness Skin: No cyanosis. Psychiatry: Judgement and insight appear normal. Mood & affect appropriate.     Data Reviewed: I have personally reviewed following labs and imaging studies  CBC Lab Results  Component Value Date   WBC 11.9 (H) 03/15/2020   RBC 3.52 (L) 03/15/2020   HGB 10.4 (L) 03/15/2020   HCT 32.7 (L) 03/15/2020   MCV 92.9 03/15/2020   MCH 29.5 03/15/2020   PLT 261 03/15/2020   MCHC 31.8 03/15/2020   RDW 16.7 (H) 03/15/2020   LYMPHSABS 1.3 03/10/2020   MONOABS 1.3 (H) 03/10/2020   EOSABS 0.0 03/10/2020   BASOSABS 0.1 03/10/2020     Last metabolic panel Lab Results  Component Value Date   NA 141 03/15/2020   K 4.0 03/15/2020   CL 101 03/15/2020   CO2 25 03/15/2020   BUN 16 03/15/2020   CREATININE 0.69 03/15/2020   GLUCOSE 88 03/15/2020   GFRNONAA >60 03/15/2020   GFRAA 154 01/30/2020   CALCIUM 8.9 03/15/2020   PHOS 4.2 02/11/2020   PROT 5.5 (L) 03/13/2020   ALBUMIN 2.7 (L) 03/13/2020   LABGLOB 2.7 01/30/2020   AGRATIO 1.2 01/30/2020   BILITOT 0.6 03/13/2020   ALKPHOS 26 (L) 03/13/2020   AST 87 (H) 03/13/2020   ALT 34 03/13/2020   ANIONGAP 15 03/15/2020    CBG (last 3)  No results for input(s): GLUCAP in the last 72 hours.   GFR: Estimated Creatinine Clearance: 155.3 mL/min (by C-G formula based on SCr of 0.69 mg/dL).  Coagulation Profile: No results for input(s): INR, PROTIME in the last 168 hours.  No results found for this or any previous visit (from the past 240 hour(s)).      Radiology Studies: No results found.      Scheduled Meds: . pantoprazole  40 mg Oral BID  . silver sulfADIAZINE   Topical BID  . sodium chloride flush  10-40 mL Intracatheter Q12H   Continuous Infusions:   LOS: 10 days     Jacquelin Hawking, MD [facility] 03/15/2020, 2:52 PM  If 7PM-7AM, please contact night-coverage [email or web address]

## 2020-03-15 NOTE — Progress Notes (Signed)
Initial Nutrition Assessment  DOCUMENTATION CODES:   Morbid obesity  INTERVENTION:   -Ensure MAX Protein po BID, each supplement provides 150 kcal and 30 grams of protein  -Multivitamin with minerals daily  NUTRITION DIAGNOSIS:   Increased nutrient needs related to acute illness (COVID-19 infection) as evidenced by estimated needs.  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 Patient presented secondary to recurrent muscle aches with evidence of significantly elevated CK on admission.  Admitted 1/13. COVID+ since 1/14.  Patient has been on a GI soft diet since admission. Consuming 25-100% of meals today. Pt has reported some dysphagia for the last few weeks. Gi recommended pt chop her food and for pt to take Protonix. Will leave soft diet to allow more options on menu. Pt able to chop food herself.   Will order Ensure Max supplements for additional protein in between meals.   Admission weight: 297 lbs. Current weight: 287 lbs  Labs reviewed. Medications: IV Lasix  NUTRITION - FOCUSED PHYSICAL EXAM:  Unable to complete  Diet Order:   Diet Order            DIET SOFT Room service appropriate? Yes; Fluid consistency: Thin  Diet effective now                 EDUCATION NEEDS:   No education needs have been identified at this time  Skin:  Skin Assessment: Reviewed RN Assessment  Last BM:  1/23 -type 6  Height:   Ht Readings from Last 1 Encounters:  03/13/20 5\' 6"  (1.676 m)    Weight:   Wt Readings from Last 1 Encounters:  03/15/20 130.2 kg   BMI:  Body mass index is 46.33 kg/m.  Estimated Nutritional Needs:   Kcal:  1950-2150  Protein:  90-105g  Fluid:  2L/day  03/17/20, MS, RD, LDN Inpatient Clinical  Dietitian Contact information available via Amion

## 2020-03-16 ENCOUNTER — Emergency Department (HOSPITAL_COMMUNITY)
Admission: EM | Admit: 2020-03-16 | Discharge: 2020-03-17 | Disposition: A | Discharge status: Home / Self Care | Payer: Medicaid Other | Attending: Emergency Medicine | Admitting: Emergency Medicine

## 2020-03-16 ENCOUNTER — Encounter (HOSPITAL_COMMUNITY): Payer: Self-pay

## 2020-03-16 DIAGNOSIS — D649 Anemia, unspecified: Secondary | ICD-10-CM | POA: Insufficient documentation

## 2020-03-16 DIAGNOSIS — R531 Weakness: Secondary | ICD-10-CM | POA: Diagnosis not present

## 2020-03-16 DIAGNOSIS — R000 Tachycardia, unspecified: Secondary | ICD-10-CM | POA: Diagnosis not present

## 2020-03-16 DIAGNOSIS — Y9389 Activity, other specified: Secondary | ICD-10-CM | POA: Insufficient documentation

## 2020-03-16 DIAGNOSIS — M79605 Pain in left leg: Secondary | ICD-10-CM | POA: Insufficient documentation

## 2020-03-16 DIAGNOSIS — R600 Localized edema: Secondary | ICD-10-CM | POA: Insufficient documentation

## 2020-03-16 DIAGNOSIS — R609 Edema, unspecified: Secondary | ICD-10-CM

## 2020-03-16 DIAGNOSIS — Z8616 Personal history of COVID-19: Secondary | ICD-10-CM | POA: Insufficient documentation

## 2020-03-16 DIAGNOSIS — R7402 Elevation of levels of lactic acid dehydrogenase (LDH): Secondary | ICD-10-CM | POA: Diagnosis not present

## 2020-03-16 DIAGNOSIS — Y92008 Other place in unspecified non-institutional (private) residence as the place of occurrence of the external cause: Secondary | ICD-10-CM | POA: Insufficient documentation

## 2020-03-16 DIAGNOSIS — R748 Abnormal levels of other serum enzymes: Secondary | ICD-10-CM | POA: Insufficient documentation

## 2020-03-16 DIAGNOSIS — Y999 Unspecified external cause status: Secondary | ICD-10-CM | POA: Insufficient documentation

## 2020-03-16 DIAGNOSIS — F329 Major depressive disorder, single episode, unspecified: Secondary | ICD-10-CM | POA: Diagnosis not present

## 2020-03-16 DIAGNOSIS — M79604 Pain in right leg: Secondary | ICD-10-CM | POA: Insufficient documentation

## 2020-03-16 DIAGNOSIS — E876 Hypokalemia: Secondary | ICD-10-CM | POA: Diagnosis not present

## 2020-03-16 DIAGNOSIS — E871 Hypo-osmolality and hyponatremia: Secondary | ICD-10-CM | POA: Diagnosis not present

## 2020-03-16 DIAGNOSIS — R7401 Elevation of levels of liver transaminase levels: Secondary | ICD-10-CM

## 2020-03-16 DIAGNOSIS — M6282 Rhabdomyolysis: Secondary | ICD-10-CM | POA: Diagnosis not present

## 2020-03-16 DIAGNOSIS — W19XXXA Unspecified fall, initial encounter: Secondary | ICD-10-CM | POA: Diagnosis not present

## 2020-03-16 DIAGNOSIS — W1839XA Other fall on same level, initial encounter: Secondary | ICD-10-CM | POA: Insufficient documentation

## 2020-03-16 DIAGNOSIS — J9811 Atelectasis: Secondary | ICD-10-CM | POA: Diagnosis not present

## 2020-03-16 MED ORDER — FUROSEMIDE 20 MG PO TABS: 20.0000 mg | ORAL_TABLET | Freq: Every day | ORAL | 0 refills | Status: DC

## 2020-03-16 MED ORDER — PANTOPRAZOLE SODIUM 40 MG PO TBEC: 40.0000 mg | DELAYED_RELEASE_TABLET | Freq: Two times a day (BID) | ORAL | 0 refills | Status: DC

## 2020-03-16 NOTE — Progress Notes (Signed)
Inpatient Rehab Admissions Coordinator:    I spoke with pt. Over the phone to discuss potential CIR admit as she is to be off COVID precautions today. She states that her plan is to go stay with a friend and do [facility] PT/OT there. She does not want to be considered for CIR at this time. CIR will sign off.   Megan Salon, MS, CCC-SLP Rehab Admissions Coordinator  [phone] (celll) [phone] (office)

## 2020-03-16 NOTE — ED Notes (Signed)
Patient refusing EKG leads at this time. She says it caused a rash in the past.

## 2020-03-16 NOTE — ED Notes (Signed)
Pt called for a vital sign update and there was no answer 

## 2020-03-16 NOTE — TOC Transition Note (Addendum)
Transition of Care [facility]) - CM/SW Discharge Note   Patient Details  Name: Haley Sandoval MRN: [id number] Date of Birth: Jan 17, 2001  [facility] CM/SW Contact:  Ida Rogue, LCSW Phone Number: 03/16/2020, 10:04 AM   Clinical Narrative:  Patient who is stable for discharge states she has a ride home, will have HH PT, SW follow up through [facility]. Ms Feehan states her address is [address] [place] [address].  Her contact phone number in chart is correct.  No further needs identified.  TOC sign off.    Barriers to Discharge: No Barriers Identified   Patient Goals and CMS Choice Patient states their goals for this hospitalization and ongoing recovery are:: Go back home      Discharge Placement                       Discharge Plan and Services   Discharge Planning Services: CM Consult                                 Social Determinants of Health (SDOH) Interventions     Readmission Risk Interventions Readmission Risk Prevention Plan 02/04/2020  Transportation Screening Complete  PCP or Specialist Appt within 5-7 Days Complete  Home Care Screening Complete  Medication Review (RN CM) Complete  Some recent data might be hidden

## 2020-03-16 NOTE — ED Provider Notes (Signed)
[facility]-EMERGENCY DEPT Provider Note   CSN: [id number] Arrival date & time: 03/16/20  1726   History Chief Complaint  Patient presents with   Weakness    Haley Sandoval is a 20 y.o. female.  The history is provided by the patient.  Weakness She has history of recent admission for rhabdomyolysis and was discharged this afternoon.  When she got home, she fell and was not able to get up.  She is complaining of ongoing pain in her legs as well as generalized swelling.  She denies fever or chills.  She denies any dyspnea.  She was supposed to get a prescription for furosemide which had not been refilled.  Of note, she does not want to have ECG leads placed on her because she had a skin reaction to them.  Of note, she had tested positive for COVID-19 during her recent hospitalization.  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   Rash 03/05/2020   SIRS (systemic inflammatory response syndrome) (HCC) 03/05/2020   COVID-19 virus infection 03/05/2020   Myalgia    Sepsis (HCC) 01/31/2020   Morbidly obese (HCC) 01/31/2020   Cellulitis 01/30/2020   Atypical pigmented skin lesion 01/29/2020   Non-intractable vomiting    Constipation    Hepatitis    Rhabdomyolysis 01/18/2020   Elevated liver enzymes 01/18/2020   Acute febrile illness 01/18/2020   Hypoalbuminemia 01/18/2020   Hyponatremia 01/18/2020   Obesity, Class III, BMI 40-49.9 (morbid obesity) (HCC) 01/18/2020    Past Surgical History:  Procedure Laterality Date   WISDOM TOOTH EXTRACTION       OB History   No obstetric history on file.     Family History  Problem Relation Age of Onset   Hypertension Mother    Colon cancer Neg Hx    Colon polyps Neg Hx    Liver disease Neg Hx    Sickle cell  anemia Neg Hx     Social History   Tobacco Use   Smoking status: Never Smoker   Smokeless tobacco: Never Used  Vaping Use   Vaping Use: Never used  Substance Use Topics   Alcohol use: Not Currently   Drug use: Not Currently    Home Medications Prior to Admission medications   Medication Sig Start Date End Date Taking? Authorizing Provider  furosemide (LASIX) 20 MG tablet Take 1 tablet (20 mg total) by mouth daily for 7 days. 03/16/20 03/23/20  Narda Bonds, MD  magnesium oxide (MAG-OX) 400 (241.3 Mg) MG tablet Take 1 tablet (400 mg total) by mouth daily. 02/16/20   Burnadette Pop, MD  pantoprazole (PROTONIX) 40 MG tablet Take 1 tablet (40 mg total) by mouth 2 (two) times daily. 03/16/20 06/14/20  Narda Bonds, MD    Allergies    Patient has no known allergies.  Review of Systems   Review of Systems  Neurological: Positive for weakness.  All other systems reviewed and are negative.   Physical Exam Updated Vital Signs BP (!) 150/96 (BP Location: Left Arm)    Pulse (!) 142    Temp 99.1 F (37.3 C) (Oral)    Resp 20    LMP  (LMP Unknown)    SpO2 100%   Physical Exam Vitals and nursing note reviewed.   20 year old  female, resting comfortably and in no acute distress. Vital signs are significant for elevated blood pressure and markedly elevated heart rate. Oxygen saturation is 100%, which is normal. Head is normocephalic and atraumatic. PERRLA, EOMI. Oropharynx is clear. Neck is nontender and supple without adenopathy or JVD. Back is nontender and there is no CVA tenderness. Lungs are clear without rales, wheezes, or rhonchi. Chest is nontender. Heart is tachycardic without murmur. Abdomen is soft, flat, nontender without masses or hepatosplenomegaly and peristalsis is normoactive. Extremities 2+ pretibial and pedal edema, full range of motion is present.  There is also trace edema of the abdominal wall and 1+ edema of the hands. Skin is warm and dry.  Nonspecific  rash on anterior chest wall consistent with contact dermatitis. Neurologic: Mental status is normal, cranial nerves are intact.  She has moderate to severe weakness of proximal muscles with only minimal weakness of distal muscles.  Elbow flexion and extension are 3/5, hip flexion is 2/5.  Grip strength is 4/5 and plantar flexion and dorsiflexion are 4+/5.  ED Results / Procedures / Treatments   Labs (all labs ordered are listed, but only abnormal results are displayed) Labs Reviewed  COMPREHENSIVE METABOLIC PANEL - Abnormal; Notable for the following components:      Result Value   Sodium 133 (*)    Potassium 3.4 (*)    Chloride 95 (*)    Glucose, Bld 125 (*)    Calcium 8.7 (*)    Total Protein 6.3 (*)    Albumin 3.0 (*)    AST 64 (*)    Alkaline Phosphatase 26 (*)    All other components within normal limits  CBC WITH DIFFERENTIAL/PLATELET - Abnormal; Notable for the following components:   WBC 18.5 (*)    RBC 3.63 (*)    Hemoglobin 10.7 (*)    HCT 33.2 (*)    RDW 16.4 (*)    Neutro Abs 14.3 (*)    Monocytes Absolute 1.7 (*)    Abs Immature Granulocytes 0.53 (*)    All other components within normal limits  CK - Abnormal; Notable for the following components:   Total CK 814 (*)    All other components within normal limits  LACTATE DEHYDROGENASE - Abnormal; Notable for the following components:   LDH 346 (*)    All other components within normal limits  D-DIMER, QUANTITATIVE (NOT AT [facility]) - Abnormal; Notable for the following components:   D-Dimer, Quant 8.67 (*)    All other components within normal limits  URINALYSIS, ROUTINE W REFLEX MICROSCOPIC - Abnormal; Notable for the following components:   Color, Urine AMBER (*)    APPearance HAZY (*)    Specific Gravity, Urine 1.039 (*)    Ketones, ur 5 (*)    Protein, ur 30 (*)    Bacteria, UA RARE (*)    All other components within normal limits  MAGNESIUM  I-STAT BETA HCG BLOOD, ED ([facility], [facility], [facility] ONLY)    EKG EKG  Interpretation  Date/Time:  Tuesday March 16 2020 23:48:42 EST Ventricular Rate:  124 PR Interval:    QRS Duration: 78 QT Interval:  318 QTC Calculation: 457 R Axis:   72 Text Interpretation: Sinus tachycardia Borderline T wave abnormalities When compared with ECG of 03/13/2020, Nonspecific T wave abnormality is now present Confirmed by Dione Booze ([id number]) on 03/17/2020 12:00:06 AM   Radiology CT Angio Chest PE W and/or Wo Contrast  Result Date: 03/17/2020 CLINICAL DATA:  20 year old female recently hospitalized for  COVID-19. Fall at home. Weakness. EXAM: CT ANGIOGRAPHY CHEST WITH CONTRAST TECHNIQUE: Multidetector CT imaging of the chest was performed using the standard protocol during bolus administration of intravenous contrast. Multiplanar CT image reconstructions and MIPs were obtained to evaluate the vascular anatomy. CONTRAST:  100mL OMNIPAQUE IOHEXOL 350 MG/ML SOLN COMPARISON:  Portable chest radiograph 03/10/2020. CT Abdomen and Pelvis 01/18/2020. FINDINGS: Cardiovascular: Suboptimal contrast bolus timing in the pulmonary arterial tree. There is also mild respiratory motion in both lungs. No central or hilar pulmonary artery filling defect identified. Bilateral segmental and visible distal branches also appear to remain patent. No convincing pulmonary artery filling defect. No cardiomegaly or pericardial effusion.  Negative visible aorta. Mediastinum/Nodes: Negative. Lungs/Pleura: Low normal lung volumes. Major airways are patent. Minor atelectasis at the costophrenic angles. Both lungs otherwise appear clear. No pleural effusion. Upper Abdomen: Some retained barium type oral contrast at the splenic flexure, small volume also within the stomach. Negative visible liver, spleen, pancreas, right adrenal gland. Musculoskeletal: Negative. Review of the MIP images confirms the above findings. IMPRESSION: 1. Suboptimal contrast bolus timing. No convincing pulmonary embolus. 2. Negative lungs aside  from minimal atelectasis. No acute findings in the chest. Electronically Signed   By: Odessa FlemingH  Hall M.D.   On: 03/17/2020 05:05    Procedures Procedures   Medications Ordered in ED Medications  potassium chloride SA (KLOR-CON) CR tablet 40 mEq (40 mEq Oral Given 03/17/20 0257)  iohexol (OMNIPAQUE) 350 MG/ML injection 100 mL (100 mLs Intravenous Contrast Given 03/17/20 0443)    ED Course  I have reviewed the triage vital signs and the nursing notes.  Pertinent labs & imaging results that were available during my care of the patient were reviewed by me and considered in my medical decision making (see chart for details).  MDM Rules/Calculators/A&P Ongoing muscle weakness and patient with a recent hospitalization for rhabdomyolysis.  Old records are reviewed, and CK had dropped to 871 on 1/24 from a peak of 11,713 on 1/13.  We will repeat screening labs including CK and also check D-dimer to rule out pulmonary embolism.  Labs are reassuring. CK is continuing to fall slowly, LDH is also falling slowly. Hemoglobin is stable. WBC is elevated, cause unclear. Potassium is borderline low and she is given a dose of oral potassium. Mild hyponatremia is present, not felt to be clinically significant. AST is mildly elevated but declining. D-dimer is significantly elevated, will send for CT angiogram of the chest.  ECG shows minor T wave changes which are probably related to her hypokalemia.  CT angiogram shows no evidence of pulmonary embolism or other acute process.  While in the ED, tachycardia has eased although still present.  She was able to ambulate considerable distance in the hall and speak in spite of my exam showing significant weakness.  Evidently, there was a death in the family which has the patient quite upset and this is making her adaptation to her rhabdomyolysis worse.  She is given outpatient resources and is advised to make sure she gets all of her prescriptions filled and start taking them as  prescribed.  Because potassium was low and she is picking up prescription for furosemide, she is given a prescription for K-Dur.  Return precautions discussed.  Final Clinical Impression(s) / ED Diagnoses Final diagnoses:  Weakness  Peripheral edema  Normochromic normocytic anemia  Hyponatremia  Elevated AST (SGOT)  Elevated CK  Reactive depression  Hypokalemia    Rx / DC Orders ED Discharge Orders  Ordered    potassium chloride SA (KLOR-CON) 20 MEQ tablet  Daily        03/17/20 0719           Dione Booze, MD 03/17/20 [phone]

## 2020-03-16 NOTE — ED Triage Notes (Addendum)
Pt arrived via EMS, from home, was d/c from [facility] inpatient this afternoon at 3pm, fell on arrival home due to weakness. No new issues, no complaints from the fall.   HR has been elevated in triage after pt mvmt to wheelchair, pt states it has been that way throughout stay at [facility], pt denies any chest pain.

## 2020-03-16 NOTE — Discharge Instructions (Signed)
Haley Sandoval,  You were in the hospital with muscle pain with concern for rhabdomyolysis. There is also a concern this could by myositis. Your symptoms have improved with IV fluids. Please follow-up with the rheumatologist as previously recommended. Because of your swelling and low albumin, I will prescribe some lasix for you. Please also keep well nourished to help your overall protein levels which will improve your swelling as well.

## 2020-03-16 NOTE — Discharge Summary (Signed)
Physician Discharge Summary  Haley Sandoval RDE:[id number] DOB: 2000-02-22 DOA: 03/04/2020  PCP: Vevelyn Francois, NP  Admit date: 03/04/2020 Discharge date: 03/16/2020  Admitted From: Home Disposition: Home  Recommendations for Outpatient Follow-up:  1. Follow up with PCP in 1 week 2. Patient to follow-up with Rheumatologist; previously referred 3. Please follow up on the following pending results: None  Home Health: PT, Social work Equipment/Devices: None  Discharge Condition: Stable CODE STATUS: Full code Diet recommendation: Heart healthy   Brief/Interim Summary:  Admission HPI written by Vianne Bulls, MD   Chief Complaint: Muscle aches and weakness, rash, fatigue   HPI: Haley Sandoval is a 20 y.o. female with medical history significant for seasonal allergy and 2 recent admissions for rhabdomyolysis, now presenting to the emergency department with worsening aches and weakness involving the bilateral lower extremities and proximal upper extremities bilaterally.  Patient reports that she seemed to improve a little bit during the 2 recent admissions but began to worsen again within a few days of discharge.  She is also developed a rash on her chest that began with swelling and tenderness during the recent admission, and while the swelling and tenderness is decreased, there has been some desquamation and crusting.  She has also had some periorbital edema since the recent admission which seems to have improved but she now has some mild periorbital erythema.  She also developed a cough 1 to 2 weeks ago, sore throat, and fatigue.  She denies shortness of breath or chest pain.    During recent admissions: Surgery was consulted for muscle biopsy but after discussion with Dr. Benjamine Mola of rheumatology, decision was made to hold off on that as the patient seemed to be improving.  She had an MRI pelvis and right femur last month notable for severe diffuse myositis.  She was treated with  antibiotics for suspected PID and soft tissue infection involving the right buttock, but pelvic ultrasound and MRI were unremarkable and chlamydia and gonorrhea testing were negative.  Culture from the right buttock wound grew normal skin flora.  Blood cultures were negative.  Viral hepatitis testing was negative.  ANA, SSA, and SSB were negative.  CRP was elevated to 5.8 and ESR normal.  Plan was for patient to follow-up with Dr. Benjamine Mola of rheumatology, but unfortunately her mother passed away unexpectedly and she was unable to follow through with this. She was diuresed during the recent hospitalization and went home with Lasix and reports that her lower extremity swelling has resolved.    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 On admission, discussion with rheumatology at [facility] recommended IVF, muscle biopsy and EMG; no beds were available at that time. Patient has been receiving IV fluids for treatment with slowly downtrending CK. IV fluids stopped secondary to patient complaint of swelling. Patient declining biopsy on this admission and would prefer to follow-up with rheumatology prior to consideration of biopsy. Needs close outpatient rheumatology follow-up.  Elevated AST/ALT Likely secondary to myositis and is improved.  Anasarca Secondary to IV fluid resuscitation in setting of hypoalbuminemia. Weight on admission of 297 lbs. Peak weight of 310 lbs and trending down. Weight of 288 lbs today. Although patient states her swelling is likely from IV fluids, she is now below admission weight. She has an albumin of 2.7 which is likely main driver for anasarca which was likely worsened with significant IV fluid for above. Discharged with Lasix PO. Weight on discharge of 130.2 kg / 287.04  lbs which was obtained on 1/24. Dietitian recommending protein supplementation.  Dysphagia Relatively new issue that started about 3 weeks ago per patient. No associated odynophagia.  Barium swallow significant for non-specific dysmotility issue. Per GI, possibly related to ?myositis GI recommendations included finely chopped food, chew food with liquid, outpatient follow-up as needed, Protonix 40 mg BID x 3 months  Hyponatremia Given IV fluids with resolution.  SIRS Present on admission. At time of admission it was not thought to be secondary to infection.  Sinus tachycardia Stable. Outpatient follow-up.  Chest rash Not infected. Per patient this is related to EKG lead stickers. Improved.  Leukocytosis Patient received a couple of days of prednisone which may be contributing. No fevers. Improved.  Morbid obesity Body mass index is 46.33 kg/m.  Discharge Diagnoses:  Principal Problem:   Rhabdomyolysis Active Problems:   Elevated liver enzymes   Hyponatremia   Rash   SIRS (systemic inflammatory response syndrome) ([place])   COVID-19 virus infection   Anasarca    Discharge Instructions  Discharge Instructions    Call MD for:  difficulty breathing, headache or visual disturbances   Complete by: As directed    Call MD for:  severe uncontrolled pain   Complete by: As directed    Increase activity slowly   Complete by: As directed    No wound care   Complete by: As directed      Allergies as of 03/16/2020   No Known Allergies     Medication List    TAKE these medications   furosemide 20 MG tablet Commonly known as: Lasix Take 1 tablet (20 mg total) by mouth daily for 7 days.   magnesium oxide 400 (241.3 Mg) MG tablet Commonly known as: MAG-OX Take 1 tablet (400 mg total) by mouth daily.   pantoprazole 40 MG tablet Commonly known as: PROTONIX Take 1 tablet (40 mg total) by mouth 2 (two) times daily.       Follow-up Information    Surgery, [place]. Call.   Specialty: General Surgery Why: Call and schedule an appointment to be evaluated and scheduled for muscle biopsy.  Contact information: [address] [place] [address] [phone]        Vevelyn Francois, NP Follow up on 03/19/2020.   Specialty: Adult Health Nurse Practitioner Why: Friday at 11:00 for your hospital follow up appointment Contact information: [address] [place] [address] [phone]        [facility] Follow up.   Contact information: [address] [place], [place] [address] Phone: [phone]             No Known Allergies  Consultations:  Inpatient rehabilitation   Procedures/Studies: DG CHEST PORT 1 VIEW  Result Date: 03/10/2020 CLINICAL DATA:  COVID positive 03/05/2020, rash and fatigue EXAM: PORTABLE CHEST 1 VIEW COMPARISON:  Radiograph 03/05/2020 FINDINGS: Increased attenuation towards the lung bases likely a combination of breast tissue and body habitus. Accounting for body habitus, the lungs are clear. No consolidation, features of edema, pneumothorax, or effusion. Pulmonary vascularity is normally distributed. The cardiomediastinal contours are unremarkable. No acute osseous or soft tissue abnormality. IMPRESSION: No acute cardiopulmonary abnormality. Electronically Signed   By: Lovena Le M.D.   On: 03/10/2020 19:40   DG Chest Port 1 View  Result Date: 03/05/2020 CLINICAL DATA:  COVID-19 EXAM: PORTABLE CHEST 1 VIEW COMPARISON:  01/18/2020 FINDINGS: The heart size and mediastinal contours are within normal limits.  Both lungs are clear. The visualized skeletal structures are unremarkable. IMPRESSION: No active disease. Electronically Signed   By: Ulyses Jarred M.D.   On: 03/05/2020 03:24   DG ESOPHAGUS W SINGLE CM (SOL OR THIN BA)  Result Date: 03/11/2020 CLINICAL DATA:  Dysphagia. EXAM: ESOPHOGRAM/BARIUM SWALLOW TECHNIQUE: Single contrast examination was performed using  thin barium. FLUOROSCOPY TIME:  Fluoroscopy Time:  2 minutes and 30 seconds Radiation Exposure Index (if provided by the fluoroscopic device): 44.3 mGy fall Number of Acquired Spot Images: 0  COMPARISON:  None. FINDINGS: Focused, single-contrast exam performed. Limitations secondary to patient immobility. Evaluation of esophageal peristalsis demonstrates suggestion of mild stasis in the lower esophagus. Full column evaluation of the esophagus demonstrates no persistent narrowing or stricture. No hiatal hernia. A 13 mm barium tablet passes promptly. IMPRESSION: Focused, single-contrast exam performed with the patient in LPO position. No esophageal narrowing or stricture. Possible mild nonspecific esophageal dysmotility. Please note that the hypopharyngeal region is not well evaluated secondary to technique and patient immobility. Electronically Signed   By: Abigail Miyamoto M.D.   On: 03/11/2020 10:11       Subjective: No issues overnight.  Discharge Exam: Vitals:   03/15/20 1953 03/16/20 0458  BP: 120/87 119/66  Pulse: (!) 120 (!) 110  Resp: 20 17  Temp: 99 F (37.2 C) 99 F (37.2 C)  SpO2: 100% 100%   Vitals:   03/15/20 0945 03/15/20 1344 03/15/20 1953 03/16/20 0458  BP: 108/60 125/74 120/87 119/66  Pulse: (!) 117 (!) 119 (!) 120 (!) 110  Resp: _0 Temp: 97.6 F (36.4 C) 98.9 F (37.2 C) 99 F (37.2 C) 99 F (37.2 C)  TempSrc: Oral Oral    SpO2: 99% 98% 100% 100%  Weight:      Height:        General: Pt is alert, awake, not in acute distress Cardiovascular: RRR, S1/S2 +, no rubs, no gallops Respiratory: CTA bilaterally, no wheezing, no rhonchi Abdominal: Soft, NT, ND, bowel sounds + Extremities: Pitting edema of extremities, no cyanosis    The results of significant diagnostics from this hospitalization (including imaging, microbiology, ancillary and laboratory) are listed below for reference.     Microbiology: No results found for this or any previous visit (from the past 240 hour(s)).   Labs: BNP (last 3 results) Recent Labs    01/30/20 1538  BNP CANCELED   Basic Metabolic Panel: Recent Labs  Lab 03/10/20 0357 03/11/20 0427  03/12/20 0419 03/13/20 0512 03/15/20 0344  NA 140 139 141 137 141  K 3.9 4.0 4.0 3.9 4.0  CL 109 104 103 99 101  CO2 _1 GLUCOSE 102* 86 90 89 88  BUN 22* _2 CREATININE 0.48 0.42* 0.50 0.52 0.69  CALCIUM 8.3* 8.7* 8.7* 8.7* 8.9   Liver Function Tests: Recent Labs  Lab 03/10/20 0357 03/13/20 0512  AST 119* 87*  ALT 46* 34  ALKPHOS 32* 26*  BILITOT 0.3 0.6  PROT 4.9* 5.5*  ALBUMIN 2.3* 2.7*   No results for input(s): LIPASE, AMYLASE in the last 168 hours. No results for input(s): AMMONIA in the last 168 hours. CBC: Recent Labs  Lab 03/10/20 1620 03/12/20 0419 03/13/20 0512 03/14/20 0339 03/15/20 0741  WBC 18.0* 16.4* 16.4* 15.4* 11.9*  NEUTROABS 14.6*  --   --   --   --   HGB 11.1* 11.3* 11.9* 11.3* 10.4*  HCT 34.6* 34.9* 37.0 35.8* 32.7*  MCV 90.6 89.7 91.1 91.8 92.9  PLT 286 287 285 284 261   Cardiac Enzymes: Recent Labs  Lab 03/11/20 0427 03/12/20 0419 03/13/20 0512 03/14/20 0339 03/15/20 0344  CKTOTAL 2,272* 2,104* 1,551* 1,029* 871*   BNP: Invalid input(s): POCBNP CBG: No results for input(s): GLUCAP in the last 168 hours. D-Dimer No results for input(s): DDIMER in the last 72 hours. Hgb A1c No results for input(s): HGBA1C in the last 72 hours. Lipid Profile No results for input(s): CHOL, HDL, LDLCALC, TRIG, CHOLHDL, LDLDIRECT in the last 72 hours. Thyroid function studies No results for input(s): TSH, T4TOTAL, T3FREE, THYROIDAB in the last 72 hours.  Invalid input(s): FREET3 Anemia work up No results for input(s): VITAMINB12, FOLATE, FERRITIN, TIBC, IRON, RETICCTPCT in the last 72 hours. Urinalysis    Component Value Date/Time   COLORURINE AMBER (A) 03/05/2020 0625   APPEARANCEUR HAZY (A) 03/05/2020 0625   LABSPEC 1.026 03/05/2020 0625   PHURINE 5.0 03/05/2020 0625   GLUCOSEU NEGATIVE 03/05/2020 0625   HGBUR LARGE (A) 03/05/2020 0625   BILIRUBINUR NEGATIVE 03/05/2020 0625   BILIRUBINUR negative 01/30/2020 1441    KETONESUR NEGATIVE 03/05/2020 0625   PROTEINUR 100 (A) 03/05/2020 0625   UROBILINOGEN 0.2 01/30/2020 1441   UROBILINOGEN 0.2 03/30/2013 1044   NITRITE NEGATIVE 03/05/2020 0625   LEUKOCYTESUR NEGATIVE 03/05/2020 0625   Sepsis Labs Invalid input(s): PROCALCITONIN,  WBC,  LACTICIDVEN Microbiology No results found for this or any previous visit (from the past 240 hour(s)).   Time coordinating discharge: 35 minutes  SIGNED:   Cordelia Poche, MD [facility] 03/16/2020, 1:29 PM

## 2020-03-17 ENCOUNTER — Emergency Department (HOSPITAL_COMMUNITY): Payer: Medicaid Other

## 2020-03-17 ENCOUNTER — Encounter (HOSPITAL_COMMUNITY): Payer: Self-pay

## 2020-03-17 DIAGNOSIS — E871 Hypo-osmolality and hyponatremia: Secondary | ICD-10-CM | POA: Diagnosis not present

## 2020-03-17 DIAGNOSIS — E876 Hypokalemia: Secondary | ICD-10-CM | POA: Diagnosis not present

## 2020-03-17 DIAGNOSIS — R531 Weakness: Secondary | ICD-10-CM | POA: Diagnosis not present

## 2020-03-17 DIAGNOSIS — R000 Tachycardia, unspecified: Secondary | ICD-10-CM | POA: Diagnosis not present

## 2020-03-17 DIAGNOSIS — J9811 Atelectasis: Secondary | ICD-10-CM | POA: Diagnosis not present

## 2020-03-17 DIAGNOSIS — D649 Anemia, unspecified: Secondary | ICD-10-CM | POA: Diagnosis not present

## 2020-03-17 DIAGNOSIS — R7402 Elevation of levels of lactic acid dehydrogenase (LDH): Secondary | ICD-10-CM | POA: Diagnosis not present

## 2020-03-17 LAB — CBC WITH DIFFERENTIAL/PLATELET
Abs Immature Granulocytes: 0.53 10*3/uL — ABNORMAL HIGH (ref 0.00–0.07)
Basophils Absolute: 0.1 10*3/uL (ref 0.0–0.1)
Basophils Relative: 0 %
Eosinophils Absolute: 0.1 10*3/uL (ref 0.0–0.5)
Eosinophils Relative: 0 %
HCT: 33.2 % — ABNORMAL LOW (ref 36.0–46.0)
Hemoglobin: 10.7 g/dL — ABNORMAL LOW (ref 12.0–15.0)
Immature Granulocytes: 3 %
Lymphocytes Relative: 10 %
Lymphs Abs: 1.9 10*3/uL (ref 0.7–4.0)
MCH: 29.5 pg (ref 26.0–34.0)
MCHC: 32.2 g/dL (ref 30.0–36.0)
MCV: 91.5 fL (ref 80.0–100.0)
Monocytes Absolute: 1.7 10*3/uL — ABNORMAL HIGH (ref 0.1–1.0)
Monocytes Relative: 9 %
Neutro Abs: 14.3 10*3/uL — ABNORMAL HIGH (ref 1.7–7.7)
Neutrophils Relative %: 78 %
Platelets: 298 10*3/uL (ref 150–400)
RBC: 3.63 MIL/uL — ABNORMAL LOW (ref 3.87–5.11)
RDW: 16.4 % — ABNORMAL HIGH (ref 11.5–15.5)
WBC: 18.5 10*3/uL — ABNORMAL HIGH (ref 4.0–10.5)
nRBC: 0 % (ref 0.0–0.2)

## 2020-03-17 LAB — URINALYSIS, ROUTINE W REFLEX MICROSCOPIC
Bilirubin Urine: NEGATIVE
Glucose, UA: NEGATIVE mg/dL
Hgb urine dipstick: NEGATIVE
Ketones, ur: 5 mg/dL — AB
Leukocytes,Ua: NEGATIVE
Nitrite: NEGATIVE
Protein, ur: 30 mg/dL — AB
Specific Gravity, Urine: 1.039 — ABNORMAL HIGH (ref 1.005–1.030)
pH: 5 (ref 5.0–8.0)

## 2020-03-17 LAB — COMPREHENSIVE METABOLIC PANEL
ALT: 25 U/L (ref 0–44)
AST: 64 U/L — ABNORMAL HIGH (ref 15–41)
Albumin: 3 g/dL — ABNORMAL LOW (ref 3.5–5.0)
Alkaline Phosphatase: 26 U/L — ABNORMAL LOW (ref 38–126)
Anion gap: 15 (ref 5–15)
BUN: 16 mg/dL (ref 6–20)
CO2: 23 mmol/L (ref 22–32)
Calcium: 8.7 mg/dL — ABNORMAL LOW (ref 8.9–10.3)
Chloride: 95 mmol/L — ABNORMAL LOW (ref 98–111)
Creatinine, Ser: 0.58 mg/dL (ref 0.44–1.00)
GFR, Estimated: >60 mL/min (ref >60)
Glucose, Bld: 125 mg/dL — ABNORMAL HIGH (ref 70–99)
Potassium: 3.4 mmol/L — ABNORMAL LOW (ref 3.5–5.1)
Sodium: 133 mmol/L — ABNORMAL LOW (ref 135–145)
Total Bilirubin: 0.7 mg/dL (ref 0.3–1.2)
Total Protein: 6.3 g/dL — ABNORMAL LOW (ref 6.5–8.1)

## 2020-03-17 LAB — CK: Total CK: 814 U/L — ABNORMAL HIGH (ref 38–234)

## 2020-03-17 LAB — I-STAT BETA HCG BLOOD, ED (MC, WL, AP ONLY): I-stat hCG, quantitative: <5 m[IU]/mL (ref <5)

## 2020-03-17 LAB — MAGNESIUM: Magnesium: 1.8 mg/dL (ref 1.7–2.4)

## 2020-03-17 LAB — D-DIMER, QUANTITATIVE: D-Dimer, Quant: 8.67 ug/mL-FEU — ABNORMAL HIGH (ref 0.00–0.50)

## 2020-03-17 LAB — LACTATE DEHYDROGENASE: LDH: 346 U/L — ABNORMAL HIGH (ref 98–192)

## 2020-03-17 MED ORDER — POTASSIUM CHLORIDE CRYS ER 20 MEQ PO TBCR: 20.0000 meq | EXTENDED_RELEASE_TABLET | Freq: Every day | ORAL | 0 refills | Status: DC

## 2020-03-17 MED ORDER — POTASSIUM CHLORIDE CRYS ER 20 MEQ PO TBCR
40.0000 meq | EXTENDED_RELEASE_TABLET | Freq: Once | ORAL | Status: AC
  Administered 2020-03-17: 40 meq via ORAL
  Filled 2020-03-17: qty 2

## 2020-03-17 MED ORDER — IOHEXOL 350 MG/ML SOLN
100.0000 mL | Freq: Once | INTRAVENOUS | Status: AC | PRN
  Administered 2020-03-17: 100 mL via INTRAVENOUS

## 2020-03-17 NOTE — Discharge Instructions (Signed)
Make sure to get the prescriptions filled that were given to you yesterday.  Return if symptoms are getting worse.

## 2020-03-17 NOTE — ED Notes (Signed)
Patient to CT.

## 2020-03-18 ENCOUNTER — Encounter: Payer: Self-pay | Admitting: Nurse Practitioner

## 2020-03-18 ENCOUNTER — Telehealth (INDEPENDENT_AMBULATORY_CARE_PROVIDER_SITE_OTHER): Payer: Medicaid Other | Admitting: Nurse Practitioner

## 2020-03-18 VITALS — Ht 66.0 in | Wt 287.0 lb

## 2020-03-18 DIAGNOSIS — R531 Weakness: Secondary | ICD-10-CM | POA: Diagnosis not present

## 2020-03-18 DIAGNOSIS — M6282 Rhabdomyolysis: Secondary | ICD-10-CM | POA: Diagnosis not present

## 2020-03-18 DIAGNOSIS — Z8616 Personal history of COVID-19: Secondary | ICD-10-CM

## 2020-03-18 NOTE — Progress Notes (Addendum)
   [facility] Patient [facility] [address] Anastasia Pall [place], [place]  [address] Phone:  [phone]   Fax:  [phone] Virtual Visit via Telephone Note  I connected with Lafe Garin on 03/18/20 at  3:00 PM EST by telephone and verified that I am speaking with the correct person using two identifiers.   I discussed the limitations, risks, security and privacy concerns of performing an evaluation and management service by telephone and the availability of in person appointments. I also discussed with the patient that there may be a patient responsible charge related to this service. The patient expressed understanding and agreed to proceed.  Patient home Provider Office  History of Present Illness: Ms Moudy is currently in the home of her grandparents due to the unexpected death of her mother. She was recently in  hospital follow-up. Recently admitted for rhabdomyolysis, Elevated liver enzymes, Hyponatremia, Rash, SIRS (systemic inflammatory response syndrome) (HCC),   COVID-19 virus infection, Anasarca Hospital course of treatment was from 03/04/2020 to 03/16/2020.  She has had 2 previous hospitalizations for similar symptoms. During hospital course Patient has been receiving IV fluids for treatment with slowly downtrending CK. IV fluids stopped secondary to patient complaint of swelling.Patient declining muscle biopsy.  She is now complaining of her family not being able to care for her. She declined home health but is requesting inpatient rehab. She admits that she cannot do anything on her own. She denies the ability to perform any ADL's    Observations/Objective: Virtual Visit No exam  Assessment and Plan: Assessment  Primary Diagnosis & Pertinent Problem List: The primary encounter diagnosis was Weakness generalized. Diagnoses of Non-traumatic rhabdomyolysis and Personal history of COVID-19 were also pertinent to this visit.  Visit Diagnosis: 1. Weakness generalized  Encouraged  patient to be assessed by home health and then allow referral to facility  2. Non-traumatic rhabdomyolysis   3. Personal history of COVID-19     Follow Up Instructions:  FU in 4 weeks  I discussed the assessment and treatment plan with the patient. The patient was provided an opportunity to ask questions and all were answered. The patient agreed with the plan and demonstrated an understanding of the instructions.   The patient was advised to call back or seek an in-person evaluation if the symptoms worsen or if the condition fails to improve as anticipated.  I provided 18 minutes of video- face-to-face time during this encounter.   Barbette Merino, NP

## 2020-03-19 ENCOUNTER — Telehealth: Payer: Medicaid Other | Admitting: Nurse Practitioner

## 2020-03-22 ENCOUNTER — Telehealth: Payer: Self-pay | Admitting: Nurse Practitioner

## 2020-03-22 DIAGNOSIS — D649 Anemia, unspecified: Secondary | ICD-10-CM | POA: Diagnosis not present

## 2020-03-22 DIAGNOSIS — R600 Localized edema: Secondary | ICD-10-CM | POA: Diagnosis not present

## 2020-03-22 DIAGNOSIS — M352 Behcet's disease: Secondary | ICD-10-CM | POA: Diagnosis not present

## 2020-03-22 DIAGNOSIS — F4321 Adjustment disorder with depressed mood: Secondary | ICD-10-CM | POA: Diagnosis not present

## 2020-03-22 DIAGNOSIS — E8809 Other disorders of plasma-protein metabolism, not elsewhere classified: Secondary | ICD-10-CM | POA: Diagnosis not present

## 2020-03-22 DIAGNOSIS — R296 Repeated falls: Secondary | ICD-10-CM | POA: Diagnosis not present

## 2020-03-22 DIAGNOSIS — R000 Tachycardia, unspecified: Secondary | ICD-10-CM | POA: Diagnosis not present

## 2020-03-22 DIAGNOSIS — Z634 Disappearance and death of family member: Secondary | ICD-10-CM | POA: Diagnosis not present

## 2020-03-22 DIAGNOSIS — M609 Myositis, unspecified: Secondary | ICD-10-CM | POA: Diagnosis not present

## 2020-03-22 DIAGNOSIS — Z9181 History of falling: Secondary | ICD-10-CM | POA: Diagnosis not present

## 2020-03-22 DIAGNOSIS — E8770 Fluid overload, unspecified: Secondary | ICD-10-CM | POA: Diagnosis not present

## 2020-03-22 DIAGNOSIS — Z20822 Contact with and (suspected) exposure to covid-19: Secondary | ICD-10-CM | POA: Diagnosis not present

## 2020-03-22 DIAGNOSIS — I10 Essential (primary) hypertension: Secondary | ICD-10-CM | POA: Diagnosis not present

## 2020-03-22 DIAGNOSIS — M3300 Juvenile dermatopolymyositis, organ involvement unspecified: Secondary | ICD-10-CM | POA: Diagnosis not present

## 2020-03-22 DIAGNOSIS — Z6841 Body Mass Index (BMI) 40.0 and over, adult: Secondary | ICD-10-CM | POA: Diagnosis not present

## 2020-03-22 DIAGNOSIS — Z87891 Personal history of nicotine dependence: Secondary | ICD-10-CM | POA: Diagnosis not present

## 2020-03-22 DIAGNOSIS — L98419 Non-pressure chronic ulcer of buttock with unspecified severity: Secondary | ICD-10-CM | POA: Diagnosis not present

## 2020-03-22 DIAGNOSIS — D72828 Other elevated white blood cell count: Secondary | ICD-10-CM | POA: Diagnosis not present

## 2020-03-22 DIAGNOSIS — R531 Weakness: Secondary | ICD-10-CM | POA: Diagnosis not present

## 2020-03-22 DIAGNOSIS — K224 Dyskinesia of esophagus: Secondary | ICD-10-CM | POA: Diagnosis not present

## 2020-03-22 NOTE — Telephone Encounter (Signed)
Pt has received and reply though my , pt  has ben transported to [facility] for further elevation

## 2020-03-22 NOTE — Telephone Encounter (Signed)
DOne

## 2020-03-23 DIAGNOSIS — G7249 Other inflammatory and immune myopathies, not elsewhere classified: Secondary | ICD-10-CM | POA: Diagnosis not present

## 2020-03-23 DIAGNOSIS — R601 Generalized edema: Secondary | ICD-10-CM | POA: Diagnosis not present

## 2020-03-23 DIAGNOSIS — M3300 Juvenile dermatopolymyositis, organ involvement unspecified: Secondary | ICD-10-CM | POA: Diagnosis not present

## 2020-03-23 DIAGNOSIS — R1312 Dysphagia, oropharyngeal phase: Secondary | ICD-10-CM | POA: Diagnosis not present

## 2020-03-23 DIAGNOSIS — R531 Weakness: Secondary | ICD-10-CM | POA: Diagnosis not present

## 2020-03-23 DIAGNOSIS — L03317 Cellulitis of buttock: Secondary | ICD-10-CM | POA: Diagnosis not present

## 2020-03-24 DIAGNOSIS — R609 Edema, unspecified: Secondary | ICD-10-CM | POA: Diagnosis not present

## 2020-03-24 DIAGNOSIS — R21 Rash and other nonspecific skin eruption: Secondary | ICD-10-CM | POA: Diagnosis not present

## 2020-03-24 DIAGNOSIS — R600 Localized edema: Secondary | ICD-10-CM | POA: Diagnosis not present

## 2020-03-24 DIAGNOSIS — R936 Abnormal findings on diagnostic imaging of limbs: Secondary | ICD-10-CM | POA: Diagnosis not present

## 2020-03-24 DIAGNOSIS — R601 Generalized edema: Secondary | ICD-10-CM | POA: Diagnosis not present

## 2020-03-24 DIAGNOSIS — G7249 Other inflammatory and immune myopathies, not elsewhere classified: Secondary | ICD-10-CM | POA: Diagnosis not present

## 2020-03-24 DIAGNOSIS — M3300 Juvenile dermatopolymyositis, organ involvement unspecified: Secondary | ICD-10-CM | POA: Diagnosis not present

## 2020-03-24 DIAGNOSIS — L03317 Cellulitis of buttock: Secondary | ICD-10-CM | POA: Diagnosis not present

## 2020-03-24 DIAGNOSIS — Z8616 Personal history of COVID-19: Secondary | ICD-10-CM | POA: Diagnosis not present

## 2020-03-24 DIAGNOSIS — R1312 Dysphagia, oropharyngeal phase: Secondary | ICD-10-CM | POA: Diagnosis not present

## 2020-03-24 DIAGNOSIS — M609 Myositis, unspecified: Secondary | ICD-10-CM | POA: Diagnosis not present

## 2020-03-24 DIAGNOSIS — R531 Weakness: Secondary | ICD-10-CM | POA: Diagnosis not present

## 2020-03-24 DIAGNOSIS — M791 Myalgia, unspecified site: Secondary | ICD-10-CM | POA: Diagnosis not present

## 2020-03-25 DIAGNOSIS — R936 Abnormal findings on diagnostic imaging of limbs: Secondary | ICD-10-CM | POA: Diagnosis not present

## 2020-03-25 DIAGNOSIS — M3300 Juvenile dermatopolymyositis, organ involvement unspecified: Secondary | ICD-10-CM | POA: Diagnosis not present

## 2020-03-25 DIAGNOSIS — L989 Disorder of the skin and subcutaneous tissue, unspecified: Secondary | ICD-10-CM | POA: Diagnosis not present

## 2020-03-25 DIAGNOSIS — R531 Weakness: Secondary | ICD-10-CM | POA: Diagnosis not present

## 2020-03-25 DIAGNOSIS — G7249 Other inflammatory and immune myopathies, not elsewhere classified: Secondary | ICD-10-CM | POA: Diagnosis not present

## 2020-03-25 DIAGNOSIS — R600 Localized edema: Secondary | ICD-10-CM | POA: Diagnosis not present

## 2020-03-25 DIAGNOSIS — R609 Edema, unspecified: Secondary | ICD-10-CM | POA: Diagnosis not present

## 2020-03-25 DIAGNOSIS — R1312 Dysphagia, oropharyngeal phase: Secondary | ICD-10-CM | POA: Diagnosis not present

## 2020-03-25 DIAGNOSIS — R601 Generalized edema: Secondary | ICD-10-CM | POA: Diagnosis not present

## 2020-03-25 DIAGNOSIS — L03317 Cellulitis of buttock: Secondary | ICD-10-CM | POA: Diagnosis not present

## 2020-03-26 DIAGNOSIS — R531 Weakness: Secondary | ICD-10-CM | POA: Diagnosis not present

## 2020-03-26 DIAGNOSIS — G7249 Other inflammatory and immune myopathies, not elsewhere classified: Secondary | ICD-10-CM | POA: Diagnosis not present

## 2020-03-26 DIAGNOSIS — M3300 Juvenile dermatopolymyositis, organ involvement unspecified: Secondary | ICD-10-CM | POA: Diagnosis not present

## 2020-03-26 DIAGNOSIS — R601 Generalized edema: Secondary | ICD-10-CM | POA: Diagnosis not present

## 2020-03-26 DIAGNOSIS — R1312 Dysphagia, oropharyngeal phase: Secondary | ICD-10-CM | POA: Diagnosis not present

## 2020-03-26 DIAGNOSIS — L03317 Cellulitis of buttock: Secondary | ICD-10-CM | POA: Diagnosis not present

## 2020-03-27 DIAGNOSIS — R601 Generalized edema: Secondary | ICD-10-CM | POA: Diagnosis not present

## 2020-03-27 DIAGNOSIS — G7249 Other inflammatory and immune myopathies, not elsewhere classified: Secondary | ICD-10-CM | POA: Diagnosis not present

## 2020-03-27 DIAGNOSIS — M609 Myositis, unspecified: Secondary | ICD-10-CM | POA: Diagnosis not present

## 2020-03-27 DIAGNOSIS — R531 Weakness: Secondary | ICD-10-CM | POA: Diagnosis not present

## 2020-03-28 DIAGNOSIS — R601 Generalized edema: Secondary | ICD-10-CM | POA: Diagnosis not present

## 2020-03-28 DIAGNOSIS — G7249 Other inflammatory and immune myopathies, not elsewhere classified: Secondary | ICD-10-CM | POA: Diagnosis not present

## 2020-03-28 DIAGNOSIS — M609 Myositis, unspecified: Secondary | ICD-10-CM | POA: Diagnosis not present

## 2020-03-29 DIAGNOSIS — M3300 Juvenile dermatopolymyositis, organ involvement unspecified: Secondary | ICD-10-CM | POA: Diagnosis not present

## 2020-03-29 DIAGNOSIS — R918 Other nonspecific abnormal finding of lung field: Secondary | ICD-10-CM | POA: Diagnosis not present

## 2020-03-29 DIAGNOSIS — R601 Generalized edema: Secondary | ICD-10-CM | POA: Diagnosis not present

## 2020-03-29 DIAGNOSIS — M609 Myositis, unspecified: Secondary | ICD-10-CM | POA: Diagnosis not present

## 2020-03-29 DIAGNOSIS — J90 Pleural effusion, not elsewhere classified: Secondary | ICD-10-CM | POA: Diagnosis not present

## 2020-03-29 DIAGNOSIS — G7249 Other inflammatory and immune myopathies, not elsewhere classified: Secondary | ICD-10-CM | POA: Diagnosis not present

## 2020-03-30 DIAGNOSIS — M609 Myositis, unspecified: Secondary | ICD-10-CM | POA: Diagnosis not present

## 2020-03-30 DIAGNOSIS — R601 Generalized edema: Secondary | ICD-10-CM | POA: Diagnosis not present

## 2020-03-30 DIAGNOSIS — M3300 Juvenile dermatopolymyositis, organ involvement unspecified: Secondary | ICD-10-CM | POA: Diagnosis not present

## 2020-03-30 DIAGNOSIS — G7249 Other inflammatory and immune myopathies, not elsewhere classified: Secondary | ICD-10-CM | POA: Diagnosis not present

## 2020-03-31 DIAGNOSIS — M3300 Juvenile dermatopolymyositis, organ involvement unspecified: Secondary | ICD-10-CM | POA: Diagnosis not present

## 2020-03-31 DIAGNOSIS — R601 Generalized edema: Secondary | ICD-10-CM | POA: Diagnosis not present

## 2020-03-31 DIAGNOSIS — G7249 Other inflammatory and immune myopathies, not elsewhere classified: Secondary | ICD-10-CM | POA: Diagnosis not present

## 2020-03-31 DIAGNOSIS — M609 Myositis, unspecified: Secondary | ICD-10-CM | POA: Diagnosis not present

## 2020-04-01 DIAGNOSIS — M3302 Juvenile dermatopolymyositis with myopathy: Secondary | ICD-10-CM | POA: Diagnosis not present

## 2020-04-01 DIAGNOSIS — D72829 Elevated white blood cell count, unspecified: Secondary | ICD-10-CM | POA: Diagnosis not present

## 2020-04-01 DIAGNOSIS — R52 Pain, unspecified: Secondary | ICD-10-CM | POA: Diagnosis not present

## 2020-04-01 DIAGNOSIS — R5381 Other malaise: Secondary | ICD-10-CM | POA: Diagnosis not present

## 2020-04-01 DIAGNOSIS — K224 Dyskinesia of esophagus: Secondary | ICD-10-CM | POA: Diagnosis not present

## 2020-04-01 DIAGNOSIS — M609 Myositis, unspecified: Secondary | ICD-10-CM | POA: Diagnosis not present

## 2020-04-01 DIAGNOSIS — R5383 Other fatigue: Secondary | ICD-10-CM | POA: Diagnosis not present

## 2020-04-01 DIAGNOSIS — G4700 Insomnia, unspecified: Secondary | ICD-10-CM | POA: Diagnosis not present

## 2020-04-01 DIAGNOSIS — Z6841 Body Mass Index (BMI) 40.0 and over, adult: Secondary | ICD-10-CM | POA: Diagnosis not present

## 2020-04-01 DIAGNOSIS — R21 Rash and other nonspecific skin eruption: Secondary | ICD-10-CM | POA: Diagnosis not present

## 2020-04-01 DIAGNOSIS — E876 Hypokalemia: Secondary | ICD-10-CM | POA: Diagnosis not present

## 2020-04-01 DIAGNOSIS — Z3009 Encounter for other general counseling and advice on contraception: Secondary | ICD-10-CM | POA: Diagnosis not present

## 2020-04-01 DIAGNOSIS — R601 Generalized edema: Secondary | ICD-10-CM | POA: Diagnosis not present

## 2020-04-01 DIAGNOSIS — R531 Weakness: Secondary | ICD-10-CM | POA: Diagnosis not present

## 2020-04-01 DIAGNOSIS — Z8616 Personal history of COVID-19: Secondary | ICD-10-CM | POA: Diagnosis not present

## 2020-04-01 DIAGNOSIS — M3300 Juvenile dermatopolymyositis, organ involvement unspecified: Secondary | ICD-10-CM | POA: Insufficient documentation

## 2020-04-01 DIAGNOSIS — G7249 Other inflammatory and immune myopathies, not elsewhere classified: Secondary | ICD-10-CM | POA: Diagnosis not present

## 2020-04-02 DIAGNOSIS — G7249 Other inflammatory and immune myopathies, not elsewhere classified: Secondary | ICD-10-CM | POA: Diagnosis not present

## 2020-04-02 DIAGNOSIS — R5381 Other malaise: Secondary | ICD-10-CM | POA: Diagnosis not present

## 2020-04-03 DIAGNOSIS — G7249 Other inflammatory and immune myopathies, not elsewhere classified: Secondary | ICD-10-CM | POA: Diagnosis not present

## 2020-04-03 DIAGNOSIS — R5381 Other malaise: Secondary | ICD-10-CM | POA: Diagnosis not present

## 2020-04-04 DIAGNOSIS — R5381 Other malaise: Secondary | ICD-10-CM | POA: Diagnosis not present

## 2020-04-04 DIAGNOSIS — G7249 Other inflammatory and immune myopathies, not elsewhere classified: Secondary | ICD-10-CM | POA: Diagnosis not present

## 2020-04-05 DIAGNOSIS — G7249 Other inflammatory and immune myopathies, not elsewhere classified: Secondary | ICD-10-CM | POA: Diagnosis not present

## 2020-04-05 DIAGNOSIS — R5381 Other malaise: Secondary | ICD-10-CM | POA: Diagnosis not present

## 2020-04-06 DIAGNOSIS — R5381 Other malaise: Secondary | ICD-10-CM | POA: Diagnosis not present

## 2020-04-06 DIAGNOSIS — G7249 Other inflammatory and immune myopathies, not elsewhere classified: Secondary | ICD-10-CM | POA: Diagnosis not present

## 2020-04-07 DIAGNOSIS — R5381 Other malaise: Secondary | ICD-10-CM | POA: Diagnosis not present

## 2020-04-07 DIAGNOSIS — G7249 Other inflammatory and immune myopathies, not elsewhere classified: Secondary | ICD-10-CM | POA: Diagnosis not present

## 2020-04-08 DIAGNOSIS — G7249 Other inflammatory and immune myopathies, not elsewhere classified: Secondary | ICD-10-CM | POA: Diagnosis not present

## 2020-04-08 DIAGNOSIS — R5381 Other malaise: Secondary | ICD-10-CM | POA: Diagnosis not present

## 2020-04-09 ENCOUNTER — Telehealth: Payer: Self-pay

## 2020-04-09 NOTE — Telephone Encounter (Signed)
Transition Care Management Follow-up Telephone Call  Date of discharge and from where: 217/2022 from [facility].   How have you been since you were released from the hospital? Pt stated that she is feeling well today.   Any questions or concerns? No  Items Reviewed:  Did the pt receive and understand the discharge instructions provided? Yes   Medications obtained and verified? Yes   Other? No   Any new allergies since your discharge? No   Dietary orders reviewed? n/a  Do you have support at home? Yes   Functional Questionnaire: (I = Independent and D = Dependent) ADLs: I  Bathing/Dressing- I  Meal Prep- I  Eating- I  Maintaining continence- I  Transferring/Ambulation- I  Managing Meds- I   Follow up appointments reviewed:   PCP Hospital f/u appt confirmed? Yes  Scheduled to see Thad Ranger, NP on 04/21/2020 @ 02:40pm.  Are transportation arrangements needed? No   If their condition worsens, is the pt aware to call PCP or go to the Emergency Dept.? Yes  Was the patient provided with contact information for the PCP's office or ED? Yes  Was to pt encouraged to call back with questions or concerns? Yes

## 2020-04-15 DIAGNOSIS — R790 Abnormal level of blood mineral: Secondary | ICD-10-CM | POA: Diagnosis not present

## 2020-04-15 DIAGNOSIS — E876 Hypokalemia: Secondary | ICD-10-CM | POA: Diagnosis not present

## 2020-04-15 DIAGNOSIS — Z13220 Encounter for screening for lipoid disorders: Secondary | ICD-10-CM | POA: Diagnosis not present

## 2020-04-21 ENCOUNTER — Inpatient Hospital Stay: Payer: Medicaid Other | Admitting: Nurse Practitioner

## 2020-04-21 DIAGNOSIS — Z23 Encounter for immunization: Secondary | ICD-10-CM | POA: Diagnosis not present

## 2020-04-21 DIAGNOSIS — D509 Iron deficiency anemia, unspecified: Secondary | ICD-10-CM | POA: Diagnosis not present

## 2020-04-21 DIAGNOSIS — M3300 Juvenile dermatopolymyositis, organ involvement unspecified: Secondary | ICD-10-CM | POA: Diagnosis not present

## 2020-04-21 DIAGNOSIS — Z79899 Other long term (current) drug therapy: Secondary | ICD-10-CM | POA: Diagnosis not present

## 2020-04-26 DIAGNOSIS — L810 Postinflammatory hyperpigmentation: Secondary | ICD-10-CM | POA: Diagnosis not present

## 2020-04-26 DIAGNOSIS — M3300 Juvenile dermatopolymyositis, organ involvement unspecified: Secondary | ICD-10-CM | POA: Diagnosis not present

## 2020-05-06 DIAGNOSIS — M3300 Juvenile dermatopolymyositis, organ involvement unspecified: Secondary | ICD-10-CM | POA: Diagnosis not present

## 2020-05-07 DIAGNOSIS — M3300 Juvenile dermatopolymyositis, organ involvement unspecified: Secondary | ICD-10-CM | POA: Diagnosis not present

## 2020-05-10 ENCOUNTER — Other Ambulatory Visit: Payer: Self-pay

## 2020-05-10 ENCOUNTER — Other Ambulatory Visit: Payer: Self-pay | Admitting: *Deleted

## 2020-05-10 NOTE — Patient Outreach (Signed)
Care Coordination  05/10/2020  Haley Sandoval 06/03/2000 [id number]   Successful outreach with Lonia Blood today. RNCM was unable to complete initial intake today due to the call being abruptly disconnected. RNCM immediately attempted to reconnect with Ms. Elliott and was unsuccessful. RNCM will attempt to outreach again over the next 7 days.  Estanislado Emms RN, BSN [place]  [facility]

## 2020-05-10 NOTE — Patient Instructions (Signed)
Visit Information  Haley Sandoval was given information about Medicaid Managed Care team care coordination services as a part of their Healthy [facility] Medicaid benefit. Haley Sandoval verbally consented to engagement with the [facility] Managed Care team.   Successful outreach with Haley Sandoval today. RNCM was unable to complete initial intake today due to the call being abruptly disconnected. RNCM immediately attempted to reconnect with Haley Sandoval and was unsuccessful. RNCM will attempt to outreach again over the next 7 days.   For questions related to your Healthy [facility] health plan, please call: [phone] or visit the homepage here: [email or web address]  If you would like to schedule transportation through your Healthy [facility] plan, please call the following number at least 2 days in advance of your appointment: [phone]   Call the [facility] at [phone], at any time, 24 hours a day, 7 days a week. If you are in danger or need immediate medical attention call 911.  Haley Sandoval - following are the goals we discussed in your visit today: -No goals were addressed today, due to disconnected call   Please see education materials related to healthy diet provided by MyChart link.    Healthy Eating Following a healthy eating pattern may help you to achieve and maintain a healthy body weight, reduce the risk of chronic disease, and live a long and productive life. It is important to follow a healthy eating pattern at an appropriate calorie level for your body. Your nutritional needs should be met primarily through food by choosing a variety of nutrient-rich foods. What are tips for following this plan? Reading food labels  Read labels and choose the following: ? Reduced or low sodium. ? Juices with 100% fruit juice. ? Foods with low saturated fats and high polyunsaturated and monounsaturated fats. ? Foods with whole grains,  such as whole wheat, cracked wheat, brown rice, and wild rice. ? Whole grains that are fortified with folic acid. This is recommended for women who are pregnant or who want to become pregnant.  Read labels and avoid the following: ? Foods with a lot of added sugars. These include foods that contain brown sugar, corn sweetener, corn syrup, dextrose, fructose, glucose, high-fructose corn syrup, honey, invert sugar, lactose, malt syrup, maltose, molasses, raw sugar, sucrose, trehalose, or turbinado sugar.  Do not eat more than the following amounts of added sugar per day:  6 teaspoons (25 g) for women.  9 teaspoons (38 g) for men. ? Foods that contain processed or refined starches and grains. ? Refined grain products, such as white flour, degermed cornmeal, white bread, and white rice. Shopping  Choose nutrient-rich snacks, such as vegetables, whole fruits, and nuts. Avoid high-calorie and high-sugar snacks, such as potato chips, fruit snacks, and candy.  Use oil-based dressings and spreads on foods instead of solid fats such as butter, stick margarine, or cream cheese.  Limit pre-made sauces, mixes, and "instant" products such as flavored rice, instant noodles, and ready-made pasta.  Try more plant-protein sources, such as tofu, tempeh, black beans, edamame, lentils, nuts, and seeds.  Explore eating plans such as the Mediterranean diet or vegetarian diet. Cooking  Use oil to saut or stir-fry foods instead of solid fats such as butter, stick margarine, or lard.  Try baking, boiling, grilling, or broiling instead of frying.  Remove the fatty part of meats before cooking.  Steam vegetables in water or broth. Meal planning  At meals, imagine dividing your plate into fourths: ? One-half of  your plate is fruits and vegetables. ? One-fourth of your plate is whole grains. ? One-fourth of your plate is protein, especially lean meats, poultry, eggs, tofu, beans, or nuts.  Include low-fat  dairy as part of your daily diet.   Lifestyle  Choose healthy options in all settings, including home, work, school, restaurants, or stores.  Prepare your food safely: ? Wash your hands after handling raw meats. ? Keep food preparation surfaces clean by regularly washing with hot, soapy water. ? Keep raw meats separate from ready-to-eat foods, such as fruits and vegetables. ? Cook seafood, meat, poultry, and eggs to the recommended internal temperature. ? Store foods at safe temperatures. In general:  Keep cold foods at 33F (4.4C) or below.  Keep hot foods at 133F (60C) or above.  Keep your freezer at [facility] (-17.8C) or below.  Foods are no longer safe to eat when they have been between the temperatures of 40-133F (4.4-60C) for more than 2 hours. What foods should I eat? Fruits Aim to eat 2 cup-equivalents of fresh, canned (in natural juice), or frozen fruits each day. Examples of 1 cup-equivalent of fruit include 1 small apple, 8 large strawberries, 1 cup canned fruit,  cup dried fruit, or 1 cup 100% juice. Vegetables Aim to eat 2-3 cup-equivalents of fresh and frozen vegetables each day, including different varieties and colors. Examples of 1 cup-equivalent of vegetables include 2 medium carrots, 2 cups raw, leafy greens, 1 cup chopped vegetable (raw or cooked), or 1 medium baked potato. Grains Aim to eat 6 ounce-equivalents of whole grains each day. Examples of 1 ounce-equivalent of grains include 1 slice of bread, 1 cup ready-to-eat cereal, 3 cups popcorn, or  cup cooked rice, pasta, or cereal. Meats and other proteins Aim to eat 5-6 ounce-equivalents of protein each day. Examples of 1 ounce-equivalent of protein include 1 egg, 1/2 cup nuts or seeds, or 1 tablespoon (16 g) peanut butter. A cut of meat or fish that is the size of a deck of cards is about 3-4 ounce-equivalents.  Of the protein you eat each week, try to have at least 8 ounces come from seafood. This includes  salmon, trout, herring, and anchovies. Dairy Aim to eat 3 cup-equivalents of fat-free or low-fat dairy each day. Examples of 1 cup-equivalent of dairy include 1 cup (240 mL) milk, 8 ounces (250 g) yogurt, 1 ounces (44 g) natural cheese, or 1 cup (240 mL) fortified soy milk. Fats and oils  Aim for about 5 teaspoons (21 g) per day. Choose monounsaturated fats, such as canola and olive oils, avocados, peanut butter, and most nuts, or polyunsaturated fats, such as sunflower, corn, and soybean oils, walnuts, pine nuts, sesame seeds, sunflower seeds, and flaxseed. Beverages  Aim for six 8-oz glasses of water per day. Limit coffee to three to five 8-oz cups per day.  Limit caffeinated beverages that have added calories, such as soda and energy drinks.  Limit alcohol intake to no more than 1 drink a day for nonpregnant women and 2 drinks a day for men. One drink equals 12 oz of beer (355 mL), 5 oz of wine (148 mL), or 1 oz of hard liquor (44 mL). Seasoning and other foods  Avoid adding excess amounts of salt to your foods. Try flavoring foods with herbs and spices instead of salt.  Avoid adding sugar to foods.  Try using oil-based dressings, sauces, and spreads instead of solid fats. This information is based on general [place] nutrition guidelines. For more  information, visit BuildDNA.es. Exact amounts may vary based on your nutrition needs. Summary  A healthy eating plan may help you to maintain a healthy weight, reduce the risk of chronic diseases, and stay active throughout your life.  Plan your meals. Make sure you eat the right portions of a variety of nutrient-rich foods.  Try baking, boiling, grilling, or broiling instead of frying.  Choose healthy options in all settings, including home, work, school, restaurants, or stores. This information is not intended to replace advice given to you by your health care provider. Make sure you discuss any questions you have with your health  care provider. Document Revised: 05/21/2017 Document Reviewed: 05/21/2017 Elsevier Patient Education  2021 [place] call was abruptly disconnected today and reattempt was unsuccessful.   The Managed Medicaid care management team will reach out to the patient again over the next 7 days.  The patient has been provided with contact information for the Managed Medicaid care management team and has been advised to call with any health related questions or concerns. Haley Sandoval, BSN [place] RN Care Coordinator   Following is a copy of your plan of care: A care plan was not created today due to the visit being disconnected

## 2020-05-26 ENCOUNTER — Other Ambulatory Visit: Payer: Self-pay

## 2020-05-26 ENCOUNTER — Other Ambulatory Visit: Payer: Self-pay | Admitting: *Deleted

## 2020-05-26 NOTE — Patient Outreach (Signed)
Care Coordination  05/26/2020  MAZIE FENCL 09/12/2000 [id number]   Lafe Garin was referred to the [facility] Managed Care High Risk team for assistance with care coordination and care management services. Care coordination/care management services as part of the Medicaid benefit was offered to the patient today. The patient declined assistance offered today.   Plan: The Medicaid Managed Care High Risk team is available at any time in the future to assist with care coordination/care management services upon referral.   Estanislado Emms RN, BSN   [facility] RN Care Coordinator

## 2020-05-26 NOTE — Patient Instructions (Signed)
Ms. Haddix,  Thank you for taking time to speak with the Managed Medicaid Team about care coordination and care management services available to you at no cost as part of your Medicaid benefit. These services are voluntary. Our team is available to provide assistance regarding your health care needs at any time. Please do not hesitate to reach out to me if we can be of service to you at any time in the future. [phone]  Estanislado Emms RN, BSN [place]  [facility] RN Care Coordinator

## 2020-08-10 DIAGNOSIS — Z79899 Other long term (current) drug therapy: Secondary | ICD-10-CM | POA: Diagnosis not present

## 2020-08-10 DIAGNOSIS — D509 Iron deficiency anemia, unspecified: Secondary | ICD-10-CM | POA: Diagnosis not present

## 2020-08-10 DIAGNOSIS — M3300 Juvenile dermatopolymyositis, organ involvement unspecified: Secondary | ICD-10-CM | POA: Diagnosis not present

## 2020-08-10 DIAGNOSIS — Z8616 Personal history of COVID-19: Secondary | ICD-10-CM | POA: Diagnosis not present

## 2020-08-10 DIAGNOSIS — Z20822 Contact with and (suspected) exposure to covid-19: Secondary | ICD-10-CM | POA: Diagnosis not present

## 2020-08-10 DIAGNOSIS — M79601 Pain in right arm: Secondary | ICD-10-CM | POA: Diagnosis not present

## 2020-08-10 DIAGNOSIS — R531 Weakness: Secondary | ICD-10-CM | POA: Diagnosis not present

## 2020-08-10 DIAGNOSIS — G4700 Insomnia, unspecified: Secondary | ICD-10-CM | POA: Diagnosis not present

## 2020-08-10 DIAGNOSIS — R9431 Abnormal electrocardiogram [ECG] [EKG]: Secondary | ICD-10-CM | POA: Diagnosis not present

## 2020-08-10 DIAGNOSIS — C801 Malignant (primary) neoplasm, unspecified: Secondary | ICD-10-CM | POA: Diagnosis not present

## 2020-08-10 DIAGNOSIS — Z7952 Long term (current) use of systemic steroids: Secondary | ICD-10-CM | POA: Diagnosis not present

## 2020-08-10 DIAGNOSIS — M3302 Juvenile dermatopolymyositis with myopathy: Secondary | ICD-10-CM | POA: Diagnosis not present

## 2020-08-10 DIAGNOSIS — M791 Myalgia, unspecified site: Secondary | ICD-10-CM | POA: Diagnosis not present

## 2020-08-10 DIAGNOSIS — M6282 Rhabdomyolysis: Secondary | ICD-10-CM | POA: Diagnosis not present

## 2020-08-10 DIAGNOSIS — Z9114 Patient's other noncompliance with medication regimen: Secondary | ICD-10-CM | POA: Diagnosis not present

## 2020-08-11 DIAGNOSIS — M791 Myalgia, unspecified site: Secondary | ICD-10-CM | POA: Diagnosis not present

## 2020-08-11 DIAGNOSIS — M3300 Juvenile dermatopolymyositis, organ involvement unspecified: Secondary | ICD-10-CM | POA: Diagnosis not present

## 2020-08-11 DIAGNOSIS — M6282 Rhabdomyolysis: Secondary | ICD-10-CM | POA: Diagnosis not present

## 2020-08-12 DIAGNOSIS — M791 Myalgia, unspecified site: Secondary | ICD-10-CM | POA: Diagnosis not present

## 2020-08-12 DIAGNOSIS — M6282 Rhabdomyolysis: Secondary | ICD-10-CM | POA: Diagnosis not present

## 2020-08-12 DIAGNOSIS — I319 Disease of pericardium, unspecified: Secondary | ICD-10-CM | POA: Diagnosis not present

## 2020-08-12 DIAGNOSIS — M3300 Juvenile dermatopolymyositis, organ involvement unspecified: Secondary | ICD-10-CM | POA: Diagnosis not present

## 2020-08-16 DIAGNOSIS — U071 COVID-19: Secondary | ICD-10-CM | POA: Diagnosis not present

## 2020-08-16 DIAGNOSIS — R748 Abnormal levels of other serum enzymes: Secondary | ICD-10-CM | POA: Diagnosis not present

## 2020-08-16 DIAGNOSIS — M3300 Juvenile dermatopolymyositis, organ involvement unspecified: Secondary | ICD-10-CM | POA: Diagnosis not present

## 2020-08-17 DIAGNOSIS — M3300 Juvenile dermatopolymyositis, organ involvement unspecified: Secondary | ICD-10-CM | POA: Diagnosis not present

## 2020-08-24 ENCOUNTER — Emergency Department (HOSPITAL_COMMUNITY)
Admission: EM | Admit: 2020-08-24 | Discharge: 2020-08-25 | Disposition: A | Discharge status: Home / Self Care | Payer: Medicaid Other | Attending: Emergency Medicine | Admitting: Emergency Medicine

## 2020-08-24 ENCOUNTER — Encounter (HOSPITAL_COMMUNITY): Payer: Self-pay

## 2020-08-24 ENCOUNTER — Other Ambulatory Visit: Payer: Self-pay

## 2020-08-24 DIAGNOSIS — M791 Myalgia, unspecified site: Secondary | ICD-10-CM | POA: Diagnosis not present

## 2020-08-24 DIAGNOSIS — R52 Pain, unspecified: Secondary | ICD-10-CM | POA: Diagnosis not present

## 2020-08-24 DIAGNOSIS — M3390 Dermatopolymyositis, unspecified, organ involvement unspecified: Secondary | ICD-10-CM | POA: Diagnosis not present

## 2020-08-24 DIAGNOSIS — G894 Chronic pain syndrome: Secondary | ICD-10-CM | POA: Diagnosis not present

## 2020-08-24 DIAGNOSIS — R5381 Other malaise: Secondary | ICD-10-CM | POA: Diagnosis not present

## 2020-08-24 DIAGNOSIS — M3310 Other dermatopolymyositis, organ involvement unspecified: Secondary | ICD-10-CM | POA: Diagnosis not present

## 2020-08-24 DIAGNOSIS — R531 Weakness: Secondary | ICD-10-CM | POA: Diagnosis not present

## 2020-08-24 DIAGNOSIS — R000 Tachycardia, unspecified: Secondary | ICD-10-CM | POA: Diagnosis not present

## 2020-08-24 HISTORY — DX: Juvenile dermatomyositis, organ involvement unspecified: M33.00

## 2020-08-24 NOTE — ED Triage Notes (Signed)
Pt to ED by PTAR from home with c/o generalized body pain 7/10 which has been getting progressively worse for the past year. 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 Arrives A+O.

## 2020-08-25 ENCOUNTER — Encounter (HOSPITAL_COMMUNITY): Payer: Self-pay | Admitting: Emergency Medicine

## 2020-08-25 DIAGNOSIS — R000 Tachycardia, unspecified: Secondary | ICD-10-CM | POA: Diagnosis not present

## 2020-08-25 DIAGNOSIS — R52 Pain, unspecified: Secondary | ICD-10-CM | POA: Diagnosis not present

## 2020-08-25 DIAGNOSIS — G894 Chronic pain syndrome: Secondary | ICD-10-CM | POA: Diagnosis not present

## 2020-08-25 DIAGNOSIS — M3310 Other dermatopolymyositis, organ involvement unspecified: Secondary | ICD-10-CM | POA: Diagnosis not present

## 2020-08-25 LAB — COMPREHENSIVE METABOLIC PANEL
ALT: 39 U/L (ref 0–44)
AST: 186 U/L — ABNORMAL HIGH (ref 15–41)
Albumin: 2.8 g/dL — ABNORMAL LOW (ref 3.5–5.0)
Alkaline Phosphatase: 27 U/L — ABNORMAL LOW (ref 38–126)
Anion gap: 10 (ref 5–15)
BUN: 11 mg/dL (ref 6–20)
CO2: 24 mmol/L (ref 22–32)
Calcium: 8.5 mg/dL — ABNORMAL LOW (ref 8.9–10.3)
Chloride: 101 mmol/L (ref 98–111)
Creatinine, Ser: 0.61 mg/dL (ref 0.44–1.00)
GFR, Estimated: >60 mL/min (ref >60)
Glucose, Bld: 104 mg/dL — ABNORMAL HIGH (ref 70–99)
Potassium: 3.9 mmol/L (ref 3.5–5.1)
Sodium: 135 mmol/L (ref 135–145)
Total Bilirubin: 0.4 mg/dL (ref 0.3–1.2)
Total Protein: 6.9 g/dL (ref 6.5–8.1)

## 2020-08-25 LAB — CBC WITH DIFFERENTIAL/PLATELET
Abs Immature Granulocytes: 0 10*3/uL (ref 0.00–0.07)
Band Neutrophils: 2 %
Basophils Absolute: 0 10*3/uL (ref 0.0–0.1)
Basophils Relative: 0 %
Eosinophils Absolute: 0 10*3/uL (ref 0.0–0.5)
Eosinophils Relative: 0 %
HCT: 36 % (ref 36.0–46.0)
Hemoglobin: 11.5 g/dL — ABNORMAL LOW (ref 12.0–15.0)
Lymphocytes Relative: 10 %
Lymphs Abs: 1.8 10*3/uL (ref 0.7–4.0)
MCH: 29 pg (ref 26.0–34.0)
MCHC: 31.9 g/dL (ref 30.0–36.0)
MCV: 90.9 fL (ref 80.0–100.0)
Monocytes Absolute: 1.8 10*3/uL — ABNORMAL HIGH (ref 0.1–1.0)
Monocytes Relative: 10 %
Neutro Abs: 14.6 10*3/uL — ABNORMAL HIGH (ref 1.7–7.7)
Neutrophils Relative %: 78 %
Platelets: 349 10*3/uL (ref 150–400)
RBC: 3.96 MIL/uL (ref 3.87–5.11)
RDW: 16.7 % — ABNORMAL HIGH (ref 11.5–15.5)
WBC: 18.3 10*3/uL — ABNORMAL HIGH (ref 4.0–10.5)
nRBC: 0 % (ref 0.0–0.2)

## 2020-08-25 LAB — CK: Total CK: 3485 U/L — ABNORMAL HIGH (ref 38–234)

## 2020-08-25 LAB — I-STAT BETA HCG BLOOD, ED (MC, WL, AP ONLY): I-stat hCG, quantitative: <5 m[IU]/mL (ref <5)

## 2020-08-25 MED ORDER — HYDROMORPHONE HCL 1 MG/ML IJ SOLN
1.0000 mg | Freq: Once | INTRAMUSCULAR | Status: AC
  Administered 2020-08-25: 1 mg via INTRAVENOUS
  Filled 2020-08-25: qty 1

## 2020-08-25 MED ORDER — ONDANSETRON HCL 4 MG/2ML IJ SOLN
4.0000 mg | Freq: Once | INTRAMUSCULAR | Status: AC
  Administered 2020-08-25: 4 mg via INTRAVENOUS
  Filled 2020-08-25: qty 2

## 2020-08-25 NOTE — Progress Notes (Addendum)
..  Transition of Care [facility]) - Emergency Department Mini Assessment   Patient Details  Name: Haley Sandoval MRN: [id number] Date of Birth: [birth year]  Transition of Care [facility]) CM/SW Contact:    Larrie Kass, LCSW Phone Number: 08/25/2020, 9:25 AM   Clinical Narrative: CSW spoke with patient, who reported she lives alone. Patient reported she wants to be transported to [facility]. CSW explained to patient that the hospital can not provide that type of transportation. CSW informed patient that we can help her get a ride to her home in [place]. CSW explained to patient, due to her insurance she will more likely not be able to get [facility], patient requested a shower seat, shower seat will be deliver to pt room at 10:30.CSW will contact [employer] upon d/c.   11:30  Confirmed shower bench in room.   Contacted safe transport for ride home, pt requested help up stairs, waiting on ETA of door to door driver from safe transport.   1:45 Pt was picked up by Alinda Money from [employer]. TOC sign off.   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   Rash 03/05/2020   SIRS (systemic inflammatory response syndrome) (HCC) 03/05/2020   COVID-19 virus infection 03/05/2020   Myalgia    Sepsis (HCC) 01/31/2020   Morbidly obese (HCC) 01/31/2020   Cellulitis 01/30/2020   Atypical pigmented skin lesion 01/29/2020   Non-intractable vomiting    Constipation    Hepatitis    Rhabdomyolysis  01/18/2020   Elevated liver enzymes 01/18/2020   Acute febrile illness 01/18/2020   Hypoalbuminemia 01/18/2020   Hyponatremia 01/18/2020   Obesity, Class III, BMI 40-49.9 (morbid obesity) (HCC) 01/18/2020   PCP:  Barbette Merino, NP Pharmacy:   [phone] [place], [place] - [address] [place] [address] Phone: [phone] Fax: [phone]  [id number] [place], [place] - [address] AT CORNER OF [facility] [address] [place] [address] Phone: [phone] Fax: [phone]

## 2020-08-25 NOTE — ED Provider Notes (Signed)
[facility]-EMERGENCY DEPT Provider Note: Lowella Dell, MD, FACEP  CSN: [id number] MRN: [id number] ARRIVAL: 08/24/20 at 2238 ROOM: WA04/WA04   CHIEF COMPLAINT  Generalized Body Aches   HISTORY OF PRESENT ILLNESS  08/25/20 12:42 AM Haley Sandoval is a 20 y.o. female with a history of juvenile dermatomyositis.  She has had generalized body pain which is been getting progressively worse for the past year.  This is followed at [facility] and her last visit with rheumatology was 08/10/2020 at which time she was admitted to the hospital for a 3-week flare of symptoms.  She was admitted for elevated CK (12,000) which the hospitalist thought was unrelated rhabdomyolysis since she did not have concomitant muscle weakness at the time.  She is currently on methotrexate and prednisone.  She is here now with several days of generalized pain which she rates as a 7 out of 10.  It hurts to move any muscles and she feels both pain and weakness when she attempts to perform activities of daily living.  She states she was unable to even feed herself.  She also has noticed that her blood whole body feels swollen, which has happened before.   Past Medical History:  Diagnosis Date   Abscess    Juvenile dermatomyositis (HCC)    Rhabdomyolysis    Seasonal allergies     Past Surgical History:  Procedure Laterality Date   WISDOM TOOTH EXTRACTION      Family History  Problem Relation Age of Onset   Hypertension Mother    Colon cancer Neg Hx    Colon polyps Neg Hx    Liver disease Neg Hx    Sickle cell anemia Neg Hx     Social History   Tobacco Use   Smoking status: Never   Smokeless tobacco: Never  Vaping Use   Vaping Use: Never used  Substance Use Topics   Alcohol use: Not Currently   Drug use: Not Currently    Prior to Admission medications   Medication Sig Start Date End Date Taking? Authorizing Provider  folic acid (FOLVITE) 1 MG tablet Take 1 mg by mouth daily.   Yes [provider]  methotrexate (RHEUMATREX) 2.5 MG tablet Take 12.5 mg by mouth once a week. Caution:Chemotherapy. Protect from light.   Yes [provider]  predniSONE (DELTASONE) 20 MG tablet Take 20 mg by mouth daily with breakfast.   Yes [provider]    Allergies Patient has no known allergies.   REVIEW OF SYSTEMS  Negative except as noted here or in the History of Present Illness.   PHYSICAL EXAMINATION  Initial Vital Signs Blood pressure 129/87, pulse (!) 136, temperature 99.1 F (37.3 C), temperature source Oral, resp. rate (!) 21, SpO2 95 %.  Examination General: Well-developed, well-nourished female in no acute distress; appearance consistent with age of record HENT: normocephalic; atraumatic Eyes: Normal appearance Neck: supple Heart: regular rate and rhythm Lungs: clear to auscultation bilaterally Abdomen: soft; nondistended; nontender; bowel sounds present Extremities: No deformity; full range of motion; generalized mild muscle tenderness Neurologic: Awake, alert and oriented; motor function intact in all extremities and symmetric; no facial droop Skin: Warm and dry; pitting edema of lower extremities  Psychiatric: Normal mood and affect   RESULTS  Summary of this visit's results, reviewed and interpreted by myself:   EKG Interpretation  Date/Time:  Wednesday August 25 2020 01:16:32 EDT Ventricular Rate:  132 PR Interval:  148 QRS Duration: 68 QT Interval:  290 QTC  Calculation: 429 R Axis:   80 Text Interpretation: Sinus tachycardia Otherwise normal ECG Rate is faster Confirmed by Shaima Sardinas ([id number]) on 08/25/2020 1:25:53 AM        Laboratory Studies: Results for orders placed or performed during the hospital encounter of 08/24/20 (from the past 24 hour(s))  CBC with Differential/Platelet     Status: Abnormal   Collection Time: 08/25/20  2:07 AM  Result Value Ref Range   WBC 18.3 (H) 4.0 - 10.5 K/uL   RBC 3.96 3.87 - 5.11 MIL/uL    Hemoglobin 11.5 (L) 12.0 - 15.0 g/dL   HCT 46.5 03.5 - 46.5 %   MCV 90.9 80.0 - 100.0 fL   MCH 29.0 26.0 - 34.0 pg   MCHC 31.9 30.0 - 36.0 g/dL   RDW 68.1 (H) 27.5 - 17.0 %   Platelets 349 150 - 400 K/uL   nRBC 0.0 0.0 - 0.2 %   Neutrophils Relative % 78 %   Neutro Abs 14.6 (H) 1.7 - 7.7 K/uL   Band Neutrophils 2 %   Lymphocytes Relative 10 %   Lymphs Abs 1.8 0.7 - 4.0 K/uL   Monocytes Relative 10 %   Monocytes Absolute 1.8 (H) 0.1 - 1.0 K/uL   Eosinophils Relative 0 %   Eosinophils Absolute 0.0 0.0 - 0.5 K/uL   Basophils Relative 0 %   Basophils Absolute 0.0 0.0 - 0.1 K/uL   WBC Morphology MILD LEFT SHIFT (1-5% METAS, OCC MYELO, OCC BANDS)    Abs Immature Granulocytes 0.00 0.00 - 0.07 K/uL   Reactive, Benign Lymphocytes PRESENT   Comprehensive metabolic panel     Status: Abnormal   Collection Time: 08/25/20  2:07 AM  Result Value Ref Range   Sodium 135 135 - 145 mmol/L   Potassium 3.9 3.5 - 5.1 mmol/L   Chloride 101 98 - 111 mmol/L   CO2 24 22 - 32 mmol/L   Glucose, Bld 104 (H) 70 - 99 mg/dL   BUN 11 6 - 20 mg/dL   Creatinine, Ser 0.17 0.44 - 1.00 mg/dL   Calcium 8.5 (L) 8.9 - 10.3 mg/dL   Total Protein 6.9 6.5 - 8.1 g/dL   Albumin 2.8 (L) 3.5 - 5.0 g/dL   AST 494 (H) 15 - 41 U/L   ALT 39 0 - 44 U/L   Alkaline Phosphatase 27 (L) 38 - 126 U/L   Total Bilirubin 0.4 0.3 - 1.2 mg/dL   GFR, Estimated >49 >67 mL/min   Anion gap 10 5 - 15  CK     Status: Abnormal   Collection Time: 08/25/20  2:07 AM  Result Value Ref Range   Total CK 3,485 (H) 38 - 234 U/L  I-Stat Beta hCG blood, ED ([facility], [facility], [facility] only)     Status: None   Collection Time: 08/25/20  2:13 AM  Result Value Ref Range   I-stat hCG, quantitative <5.0 <5 mIU/mL   Comment 3           Imaging Studies: No results found.  ED COURSE and MDM  Nursing notes, initial and subsequent vitals signs, including pulse oximetry, reviewed and interpreted by myself.  Vitals:   08/25/20 0430 08/25/20 0500 08/25/20 0600  08/25/20 0700  BP: 125/84 130/79 122/77 115/74  Pulse: (!) 130 (!) 123 (!) 125 (!) 124  Resp: (!) 22  20   Temp:      TempSrc:      SpO2: 100% 100% 99% 99%   Medications  ondansetron (ZOFRAN) injection 4 mg (4 mg Intravenous Given 08/25/20 0126)  HYDROmorphone (DILAUDID) injection 1 mg (1 mg Intravenous Given 08/25/20 0127)   7:01 AM The patient CK is 3485, significantly lower than the 6000 she had when she was discharged from [facility] after treatment for rhabdomyolysis.  Patient was advised she has no laboratory abnormalities or physical findings that would warrant emergent hospitalization at this time.  Her leukocytosis was present 5 months ago as well and is likely due to chronic steroid use.  Her clinic note from [facility], The rheumatology includes the statement "this patient, well-known to [place]" and would imply the patient has follow-up familiar with her care.  The patient herself states that her rheumatologist "went on vacation for a year" and did not provide any referral for follow-up.  She was told that this dissent not sound like proper professional behavior and she should contact the rheumatology clinic and inquire about seeing another physician.  She declines analgesics as "nothing works".  She would like to speak to a Child psychotherapist about getting some home health or placement.   PROCEDURES  Procedures   ED DIAGNOSES     ICD-10-CM   1. Dermatomyositis (HCC)  M33.90     2. Chronic pain associated with significant psychosocial dysfunction  G89.4          Lois Ostrom, Jonny Ruiz, MD 08/25/20 [id number]

## 2020-08-26 NOTE — Telephone Encounter (Signed)
Patient is scheduled for virtual visit on 09/01/2020.

## 2020-08-29 DIAGNOSIS — E8809 Other disorders of plasma-protein metabolism, not elsewhere classified: Secondary | ICD-10-CM | POA: Diagnosis not present

## 2020-08-29 DIAGNOSIS — M7918 Myalgia, other site: Secondary | ICD-10-CM | POA: Diagnosis not present

## 2020-08-29 DIAGNOSIS — Z6841 Body Mass Index (BMI) 40.0 and over, adult: Secondary | ICD-10-CM | POA: Diagnosis not present

## 2020-08-29 DIAGNOSIS — R000 Tachycardia, unspecified: Secondary | ICD-10-CM | POA: Diagnosis not present

## 2020-08-29 DIAGNOSIS — M6281 Muscle weakness (generalized): Secondary | ICD-10-CM | POA: Diagnosis not present

## 2020-08-29 DIAGNOSIS — Z8616 Personal history of COVID-19: Secondary | ICD-10-CM | POA: Diagnosis not present

## 2020-08-29 DIAGNOSIS — E119 Type 2 diabetes mellitus without complications: Secondary | ICD-10-CM | POA: Diagnosis not present

## 2020-08-29 DIAGNOSIS — U071 COVID-19: Secondary | ICD-10-CM | POA: Diagnosis not present

## 2020-08-29 DIAGNOSIS — T380X5A Adverse effect of glucocorticoids and synthetic analogues, initial encounter: Secondary | ICD-10-CM | POA: Diagnosis not present

## 2020-08-29 DIAGNOSIS — Z7952 Long term (current) use of systemic steroids: Secondary | ICD-10-CM | POA: Diagnosis not present

## 2020-08-29 DIAGNOSIS — E8770 Fluid overload, unspecified: Secondary | ICD-10-CM | POA: Diagnosis not present

## 2020-08-29 DIAGNOSIS — R9431 Abnormal electrocardiogram [ECG] [EKG]: Secondary | ICD-10-CM | POA: Diagnosis not present

## 2020-08-29 DIAGNOSIS — L309 Dermatitis, unspecified: Secondary | ICD-10-CM | POA: Diagnosis not present

## 2020-08-29 DIAGNOSIS — R531 Weakness: Secondary | ICD-10-CM | POA: Diagnosis not present

## 2020-08-29 DIAGNOSIS — R609 Edema, unspecified: Secondary | ICD-10-CM | POA: Diagnosis not present

## 2020-08-29 DIAGNOSIS — R748 Abnormal levels of other serum enzymes: Secondary | ICD-10-CM | POA: Diagnosis not present

## 2020-08-29 DIAGNOSIS — M3300 Juvenile dermatopolymyositis, organ involvement unspecified: Secondary | ICD-10-CM | POA: Diagnosis not present

## 2020-08-29 DIAGNOSIS — R6510 Systemic inflammatory response syndrome (SIRS) of non-infectious origin without acute organ dysfunction: Secondary | ICD-10-CM | POA: Diagnosis not present

## 2020-08-29 DIAGNOSIS — R601 Generalized edema: Secondary | ICD-10-CM | POA: Diagnosis not present

## 2020-08-29 DIAGNOSIS — D649 Anemia, unspecified: Secondary | ICD-10-CM | POA: Diagnosis not present

## 2020-08-29 DIAGNOSIS — Z79899 Other long term (current) drug therapy: Secondary | ICD-10-CM | POA: Diagnosis not present

## 2020-08-29 DIAGNOSIS — F4320 Adjustment disorder, unspecified: Secondary | ICD-10-CM | POA: Diagnosis not present

## 2020-08-29 DIAGNOSIS — D849 Immunodeficiency, unspecified: Secondary | ICD-10-CM | POA: Diagnosis not present

## 2020-08-29 DIAGNOSIS — R600 Localized edema: Secondary | ICD-10-CM | POA: Diagnosis not present

## 2020-08-30 DIAGNOSIS — Z8616 Personal history of COVID-19: Secondary | ICD-10-CM | POA: Diagnosis not present

## 2020-08-30 DIAGNOSIS — R601 Generalized edema: Secondary | ICD-10-CM | POA: Diagnosis not present

## 2020-08-30 DIAGNOSIS — R609 Edema, unspecified: Secondary | ICD-10-CM | POA: Diagnosis not present

## 2020-08-30 DIAGNOSIS — M3302 Juvenile dermatopolymyositis with myopathy: Secondary | ICD-10-CM | POA: Diagnosis not present

## 2020-08-31 DIAGNOSIS — R601 Generalized edema: Secondary | ICD-10-CM | POA: Diagnosis not present

## 2020-08-31 DIAGNOSIS — Z8616 Personal history of COVID-19: Secondary | ICD-10-CM | POA: Diagnosis not present

## 2020-08-31 DIAGNOSIS — M3302 Juvenile dermatopolymyositis with myopathy: Secondary | ICD-10-CM | POA: Diagnosis not present

## 2020-09-01 ENCOUNTER — Telehealth (INDEPENDENT_AMBULATORY_CARE_PROVIDER_SITE_OTHER): Payer: Medicaid Other | Admitting: Nurse Practitioner

## 2020-09-01 ENCOUNTER — Other Ambulatory Visit: Payer: Self-pay

## 2020-09-01 ENCOUNTER — Encounter: Payer: Self-pay | Admitting: Nurse Practitioner

## 2020-09-01 DIAGNOSIS — M3300 Juvenile dermatopolymyositis, organ involvement unspecified: Secondary | ICD-10-CM | POA: Diagnosis not present

## 2020-09-01 DIAGNOSIS — M3302 Juvenile dermatopolymyositis with myopathy: Secondary | ICD-10-CM | POA: Diagnosis not present

## 2020-09-01 DIAGNOSIS — R601 Generalized edema: Secondary | ICD-10-CM | POA: Diagnosis not present

## 2020-09-01 DIAGNOSIS — Z8616 Personal history of COVID-19: Secondary | ICD-10-CM | POA: Diagnosis not present

## 2020-09-01 NOTE — Progress Notes (Signed)
   [facility] Patient [facility] [address] [place], [place]  [address] Phone:  [phone]   Fax:  [phone] Virtual Visit via Video Note  I connected with Lafe Garin on 09/01/20 at  3:40 PM EDT by video and verified that I am speaking with the correct person using two identifiers.   I discussed the limitations, risks, security and privacy concerns of performing an evaluation and management service by video and the availability of in person appointments. I also discussed with the patient that there may be a patient responsible charge related to this service. The patient expressed understanding and agreed to proceed.  Patient home Provider Office  History of Present Illness: She  has a past medical history of Abscess, Juvenile dermatomyositis (HCC), Rhabdomyolysis, and Seasonal allergies.   She is at [facility] currently inpatient and reports being diagnosed with Juvenile dermatomyositis . She has had a recent flare up and was trying to be seen in the ED but was being sent home . So she has reached out to our office for an apt.  She is on steroids; prednisone 20 mg daily, MTX 2.5 mg and folic acid daily. She reports having to get 2 doses of IVIG .  She has gotten her own place and was working and doing well before this flare up.     Observations/Objective: Virtual visit no exam   Assessment and Plan: Assessment  Primary Diagnosis & Pertinent Problem List: The encounter diagnosis was Juvenile dermatomyositis (HCC).  Visit Diagnosis: 1. Juvenile dermatomyositis (HCC)      Follow Up Instructions: Fu in a few month   I discussed the assessment and treatment plan with the patient. The patient was provided an opportunity to ask questions and all were answered. The patient agreed with the plan and demonstrated an understanding of the instructions.   The patient was advised to call back or seek an in-person evaluation if the symptoms worsen or if the condition fails to improve as  anticipated.  I provided 18 minutes of video- visit time during this encounter.   Barbette Merino, NP

## 2020-09-02 DIAGNOSIS — Z8616 Personal history of COVID-19: Secondary | ICD-10-CM | POA: Diagnosis not present

## 2020-09-02 DIAGNOSIS — M3302 Juvenile dermatopolymyositis with myopathy: Secondary | ICD-10-CM | POA: Diagnosis not present

## 2020-09-02 DIAGNOSIS — R601 Generalized edema: Secondary | ICD-10-CM | POA: Diagnosis not present

## 2020-09-23 NOTE — Telephone Encounter (Signed)
Is it possible for you to call to speak to the intake nurse or social worker at this [facility] and see what we can do to get her back in thanks

## 2020-09-24 NOTE — Telephone Encounter (Signed)
Left voicemail for admissions to call back regarding patient.

## 2020-09-29 DIAGNOSIS — E611 Iron deficiency: Secondary | ICD-10-CM | POA: Diagnosis not present

## 2020-09-29 DIAGNOSIS — R000 Tachycardia, unspecified: Secondary | ICD-10-CM | POA: Diagnosis not present

## 2020-09-29 DIAGNOSIS — Z20822 Contact with and (suspected) exposure to covid-19: Secondary | ICD-10-CM | POA: Diagnosis not present

## 2020-09-29 DIAGNOSIS — R21 Rash and other nonspecific skin eruption: Secondary | ICD-10-CM | POA: Diagnosis not present

## 2020-09-29 DIAGNOSIS — R601 Generalized edema: Secondary | ICD-10-CM | POA: Diagnosis not present

## 2020-09-29 DIAGNOSIS — Z6841 Body Mass Index (BMI) 40.0 and over, adult: Secondary | ICD-10-CM | POA: Diagnosis not present

## 2020-09-29 DIAGNOSIS — E538 Deficiency of other specified B group vitamins: Secondary | ICD-10-CM | POA: Diagnosis not present

## 2020-09-29 DIAGNOSIS — I959 Hypotension, unspecified: Secondary | ICD-10-CM | POA: Diagnosis not present

## 2020-09-29 DIAGNOSIS — Z8616 Personal history of COVID-19: Secondary | ICD-10-CM | POA: Diagnosis not present

## 2020-09-29 DIAGNOSIS — M3300 Juvenile dermatopolymyositis, organ involvement unspecified: Secondary | ICD-10-CM | POA: Diagnosis not present

## 2020-09-29 DIAGNOSIS — E669 Obesity, unspecified: Secondary | ICD-10-CM | POA: Diagnosis not present

## 2020-09-29 DIAGNOSIS — D72829 Elevated white blood cell count, unspecified: Secondary | ICD-10-CM | POA: Diagnosis not present

## 2020-09-29 DIAGNOSIS — M3390 Dermatopolymyositis, unspecified, organ involvement unspecified: Secondary | ICD-10-CM | POA: Diagnosis not present

## 2020-09-29 DIAGNOSIS — R531 Weakness: Secondary | ICD-10-CM | POA: Diagnosis not present

## 2020-09-29 DIAGNOSIS — E559 Vitamin D deficiency, unspecified: Secondary | ICD-10-CM | POA: Diagnosis not present

## 2020-09-29 DIAGNOSIS — R222 Localized swelling, mass and lump, trunk: Secondary | ICD-10-CM | POA: Diagnosis not present

## 2020-09-30 DIAGNOSIS — E611 Iron deficiency: Secondary | ICD-10-CM | POA: Diagnosis not present

## 2020-09-30 DIAGNOSIS — E539 Vitamin B deficiency, unspecified: Secondary | ICD-10-CM | POA: Diagnosis not present

## 2020-09-30 DIAGNOSIS — E538 Deficiency of other specified B group vitamins: Secondary | ICD-10-CM | POA: Diagnosis not present

## 2020-09-30 DIAGNOSIS — E669 Obesity, unspecified: Secondary | ICD-10-CM | POA: Diagnosis not present

## 2020-09-30 DIAGNOSIS — E559 Vitamin D deficiency, unspecified: Secondary | ICD-10-CM | POA: Diagnosis not present

## 2020-09-30 DIAGNOSIS — Z6841 Body Mass Index (BMI) 40.0 and over, adult: Secondary | ICD-10-CM | POA: Diagnosis not present

## 2020-09-30 DIAGNOSIS — R601 Generalized edema: Secondary | ICD-10-CM | POA: Diagnosis not present

## 2020-09-30 DIAGNOSIS — M3390 Dermatopolymyositis, unspecified, organ involvement unspecified: Secondary | ICD-10-CM | POA: Diagnosis not present

## 2020-10-01 DIAGNOSIS — E538 Deficiency of other specified B group vitamins: Secondary | ICD-10-CM | POA: Diagnosis not present

## 2020-10-01 DIAGNOSIS — R601 Generalized edema: Secondary | ICD-10-CM | POA: Diagnosis not present

## 2020-10-01 DIAGNOSIS — E611 Iron deficiency: Secondary | ICD-10-CM | POA: Diagnosis not present

## 2020-10-01 DIAGNOSIS — E559 Vitamin D deficiency, unspecified: Secondary | ICD-10-CM | POA: Diagnosis not present

## 2020-10-01 DIAGNOSIS — E669 Obesity, unspecified: Secondary | ICD-10-CM | POA: Diagnosis not present

## 2020-10-01 DIAGNOSIS — E539 Vitamin B deficiency, unspecified: Secondary | ICD-10-CM | POA: Diagnosis not present

## 2020-10-01 DIAGNOSIS — M3390 Dermatopolymyositis, unspecified, organ involvement unspecified: Secondary | ICD-10-CM | POA: Diagnosis not present

## 2020-10-01 DIAGNOSIS — Z6841 Body Mass Index (BMI) 40.0 and over, adult: Secondary | ICD-10-CM | POA: Diagnosis not present

## 2020-10-02 DIAGNOSIS — E611 Iron deficiency: Secondary | ICD-10-CM | POA: Diagnosis not present

## 2020-10-02 DIAGNOSIS — E559 Vitamin D deficiency, unspecified: Secondary | ICD-10-CM | POA: Diagnosis not present

## 2020-10-02 DIAGNOSIS — R601 Generalized edema: Secondary | ICD-10-CM | POA: Diagnosis not present

## 2020-10-02 DIAGNOSIS — Z6841 Body Mass Index (BMI) 40.0 and over, adult: Secondary | ICD-10-CM | POA: Diagnosis not present

## 2020-10-02 DIAGNOSIS — E669 Obesity, unspecified: Secondary | ICD-10-CM | POA: Diagnosis not present

## 2020-10-02 DIAGNOSIS — M3390 Dermatopolymyositis, unspecified, organ involvement unspecified: Secondary | ICD-10-CM | POA: Diagnosis not present

## 2020-10-02 DIAGNOSIS — E538 Deficiency of other specified B group vitamins: Secondary | ICD-10-CM | POA: Diagnosis not present

## 2020-10-02 DIAGNOSIS — E539 Vitamin B deficiency, unspecified: Secondary | ICD-10-CM | POA: Diagnosis not present

## 2020-10-03 DIAGNOSIS — M3390 Dermatopolymyositis, unspecified, organ involvement unspecified: Secondary | ICD-10-CM | POA: Diagnosis not present

## 2020-10-03 DIAGNOSIS — Z6841 Body Mass Index (BMI) 40.0 and over, adult: Secondary | ICD-10-CM | POA: Diagnosis not present

## 2020-10-03 DIAGNOSIS — E669 Obesity, unspecified: Secondary | ICD-10-CM | POA: Diagnosis not present

## 2020-10-03 DIAGNOSIS — E611 Iron deficiency: Secondary | ICD-10-CM | POA: Diagnosis not present

## 2020-10-03 DIAGNOSIS — E539 Vitamin B deficiency, unspecified: Secondary | ICD-10-CM | POA: Diagnosis not present

## 2020-10-03 DIAGNOSIS — E559 Vitamin D deficiency, unspecified: Secondary | ICD-10-CM | POA: Diagnosis not present

## 2020-10-03 DIAGNOSIS — R601 Generalized edema: Secondary | ICD-10-CM | POA: Diagnosis not present

## 2020-10-03 DIAGNOSIS — E538 Deficiency of other specified B group vitamins: Secondary | ICD-10-CM | POA: Diagnosis not present

## 2020-10-04 ENCOUNTER — Telehealth: Payer: Self-pay

## 2020-10-04 NOTE — Telephone Encounter (Signed)
Transition Care Management Follow-up Telephone Call Date of discharge and from where: 10/03/2020-[facility]  How have you been since you were released from the hospital? Patient stated she is doing ok.  Any questions or concerns? No  Items Reviewed: Did the pt receive and understand the discharge instructions provided? Yes  Medications obtained and verified? Yes  Other? No  Any new allergies since your discharge? No  Dietary orders reviewed? N/A Do you have support at home? Yes   Home Care and Equipment/Supplies: Were home health services ordered? not applicable If so, what is the name of the agency? N/A  Has the agency set up a time to come to the patient's home? not applicable Were any new equipment or medical supplies ordered?  No What is the name of the medical supply agency? N/A Were you able to get the supplies/equipment? not applicable Do you have any questions related to the use of the equipment or supplies? No  Functional Questionnaire: (I = Independent and D = Dependent) ADLs: I  Bathing/Dressing- I  Meal Prep- I  Eating- I  Maintaining continence- I  Transferring/Ambulation- I  Managing Meds- I  Follow up appointments reviewed:  PCP Hospital f/u appt confirmed? Yes  Scheduled to see Crystal King,NP on 11/04/2020 @ 1:00 PM. Specialist Hospital f/u appt confirmed? Yes  Scheduled to see Dr. Maia Plan on 10/07/2020 @ 1:30 pm. Are transportation arrangements needed? No  If their condition worsens, is the pt aware to call PCP or go to the Emergency Dept.? Yes Was the patient provided with contact information for the PCP's office or ED? Yes Was to pt encouraged to call back with questions or concerns? Yes

## 2020-10-14 ENCOUNTER — Other Ambulatory Visit: Payer: Self-pay | Admitting: Nurse Practitioner

## 2020-10-14 NOTE — Progress Notes (Signed)
   [facility] [address] [place], [place]  [address] Phone:  [phone]   Fax:  [phone] 

## 2020-10-15 DIAGNOSIS — Z20822 Contact with and (suspected) exposure to covid-19: Secondary | ICD-10-CM | POA: Diagnosis not present

## 2020-10-15 DIAGNOSIS — E559 Vitamin D deficiency, unspecified: Secondary | ICD-10-CM | POA: Diagnosis not present

## 2020-10-15 DIAGNOSIS — Z79899 Other long term (current) drug therapy: Secondary | ICD-10-CM | POA: Diagnosis not present

## 2020-10-15 DIAGNOSIS — Z7952 Long term (current) use of systemic steroids: Secondary | ICD-10-CM | POA: Diagnosis not present

## 2020-10-15 DIAGNOSIS — M3310 Other dermatopolymyositis, organ involvement unspecified: Secondary | ICD-10-CM | POA: Diagnosis not present

## 2020-10-15 DIAGNOSIS — N6489 Other specified disorders of breast: Secondary | ICD-10-CM | POA: Diagnosis not present

## 2020-10-15 DIAGNOSIS — M3300 Juvenile dermatopolymyositis, organ involvement unspecified: Secondary | ICD-10-CM | POA: Diagnosis not present

## 2020-10-15 DIAGNOSIS — Z8616 Personal history of COVID-19: Secondary | ICD-10-CM | POA: Diagnosis not present

## 2020-10-15 DIAGNOSIS — D638 Anemia in other chronic diseases classified elsewhere: Secondary | ICD-10-CM | POA: Diagnosis not present

## 2020-10-15 DIAGNOSIS — G7249 Other inflammatory and immune myopathies, not elsewhere classified: Secondary | ICD-10-CM | POA: Diagnosis not present

## 2020-10-16 DIAGNOSIS — G7249 Other inflammatory and immune myopathies, not elsewhere classified: Secondary | ICD-10-CM | POA: Diagnosis not present

## 2020-10-16 DIAGNOSIS — R000 Tachycardia, unspecified: Secondary | ICD-10-CM | POA: Diagnosis not present

## 2020-10-16 DIAGNOSIS — M3300 Juvenile dermatopolymyositis, organ involvement unspecified: Secondary | ICD-10-CM | POA: Diagnosis not present

## 2020-10-17 DIAGNOSIS — M3300 Juvenile dermatopolymyositis, organ involvement unspecified: Secondary | ICD-10-CM | POA: Diagnosis not present

## 2020-10-18 DIAGNOSIS — M3300 Juvenile dermatopolymyositis, organ involvement unspecified: Secondary | ICD-10-CM | POA: Diagnosis not present

## 2020-10-18 DIAGNOSIS — L942 Calcinosis cutis: Secondary | ICD-10-CM | POA: Diagnosis not present

## 2020-10-19 DIAGNOSIS — M3300 Juvenile dermatopolymyositis, organ involvement unspecified: Secondary | ICD-10-CM | POA: Diagnosis not present

## 2020-10-20 DIAGNOSIS — M3300 Juvenile dermatopolymyositis, organ involvement unspecified: Secondary | ICD-10-CM | POA: Diagnosis not present

## 2020-10-21 DIAGNOSIS — M3300 Juvenile dermatopolymyositis, organ involvement unspecified: Secondary | ICD-10-CM | POA: Diagnosis not present

## 2020-10-22 DIAGNOSIS — R5381 Other malaise: Secondary | ICD-10-CM | POA: Diagnosis not present

## 2020-10-22 DIAGNOSIS — L942 Calcinosis cutis: Secondary | ICD-10-CM | POA: Diagnosis not present

## 2020-10-22 DIAGNOSIS — M3300 Juvenile dermatopolymyositis, organ involvement unspecified: Secondary | ICD-10-CM | POA: Diagnosis not present

## 2020-10-23 DIAGNOSIS — R5381 Other malaise: Secondary | ICD-10-CM | POA: Diagnosis not present

## 2020-10-24 DIAGNOSIS — R5381 Other malaise: Secondary | ICD-10-CM | POA: Diagnosis not present

## 2020-10-25 DIAGNOSIS — R5381 Other malaise: Secondary | ICD-10-CM | POA: Diagnosis not present

## 2020-10-26 DIAGNOSIS — R5381 Other malaise: Secondary | ICD-10-CM | POA: Diagnosis not present

## 2020-10-27 DIAGNOSIS — R5381 Other malaise: Secondary | ICD-10-CM | POA: Diagnosis not present

## 2020-10-28 DIAGNOSIS — R5381 Other malaise: Secondary | ICD-10-CM | POA: Diagnosis not present

## 2020-10-29 ENCOUNTER — Telehealth: Payer: Self-pay

## 2020-10-29 ENCOUNTER — Ambulatory Visit: Payer: Medicaid Other | Attending: Physical Therapy

## 2020-10-29 NOTE — Telephone Encounter (Signed)
Transition Care Management Follow-up Telephone Call Date of discharge and from where: 10/28/2020-[facility]  How have you been since you were released from the hospital? Patient stated she feels fine. Any questions or concerns? No  Items Reviewed: Did the pt receive and understand the discharge instructions provided? Yes  Medications obtained and verified? Yes  Other? No  Any new allergies since your discharge? No  Dietary orders reviewed? N/A Do you have support at home? No   Home Care and Equipment/Supplies: Were home health services ordered? not applicable If so, what is the name of the agency? N/A  Has the agency set up a time to come to the patient's home? not applicable Were any new equipment or medical supplies ordered?  No What is the name of the medical supply agency? N/A Were you able to get the supplies/equipment? not applicable Do you have any questions related to the use of the equipment or supplies? No  Functional Questionnaire: (I = Independent and D = Dependent) ADLs: I  Bathing/Dressing- I  Meal Prep- I  Eating- I  Maintaining continence- I  Transferring/Ambulation- I  Managing Meds- I  Follow up appointments reviewed:  PCP Hospital f/u appt confirmed? Yes  Scheduled to see Crystal King,NP on 11/04/2020 @ 1 PM. Specialist Hospital f/u appt confirmed? Yes  Scheduled to see [facility] on 10/29/2020 @ 2pm. Are transportation arrangements needed? No  If their condition worsens, is the pt aware to call PCP or go to the Emergency Dept.? Yes Was the patient provided with contact information for the PCP's office or ED? Yes Was to pt encouraged to call back with questions or concerns? Yes

## 2020-11-04 ENCOUNTER — Ambulatory Visit: Payer: Medicaid Other | Admitting: Nurse Practitioner

## 2020-11-17 DIAGNOSIS — L942 Calcinosis cutis: Secondary | ICD-10-CM | POA: Diagnosis not present

## 2020-11-17 DIAGNOSIS — Z79899 Other long term (current) drug therapy: Secondary | ICD-10-CM | POA: Diagnosis not present

## 2020-11-17 DIAGNOSIS — M3300 Juvenile dermatopolymyositis, organ involvement unspecified: Secondary | ICD-10-CM | POA: Diagnosis not present

## 2020-11-17 DIAGNOSIS — E559 Vitamin D deficiency, unspecified: Secondary | ICD-10-CM | POA: Diagnosis not present

## 2020-11-17 DIAGNOSIS — Z8249 Family history of ischemic heart disease and other diseases of the circulatory system: Secondary | ICD-10-CM | POA: Diagnosis not present

## 2020-11-17 DIAGNOSIS — Z7952 Long term (current) use of systemic steroids: Secondary | ICD-10-CM | POA: Diagnosis not present

## 2020-12-15 DIAGNOSIS — M3390 Dermatopolymyositis, unspecified, organ involvement unspecified: Secondary | ICD-10-CM | POA: Diagnosis not present

## 2020-12-16 DIAGNOSIS — M3390 Dermatopolymyositis, unspecified, organ involvement unspecified: Secondary | ICD-10-CM | POA: Diagnosis not present

## 2021-01-10 DIAGNOSIS — M3390 Dermatopolymyositis, unspecified, organ involvement unspecified: Secondary | ICD-10-CM | POA: Diagnosis not present

## 2021-01-11 DIAGNOSIS — M3390 Dermatopolymyositis, unspecified, organ involvement unspecified: Secondary | ICD-10-CM | POA: Diagnosis not present

## 2021-02-07 DIAGNOSIS — M3390 Dermatopolymyositis, unspecified, organ involvement unspecified: Secondary | ICD-10-CM | POA: Diagnosis not present

## 2021-02-08 DIAGNOSIS — M3390 Dermatopolymyositis, unspecified, organ involvement unspecified: Secondary | ICD-10-CM | POA: Diagnosis not present

## 2021-09-21 IMAGING — CT CT PELVIS W/ CM
2 of 3 series · 17 of 46 positions shown, 19 images · IV contrast (APPLIED)
Comparison: January 18, 2020

CLINICAL DATA: Anorectal abscess.

EXAM:
CT PELVIS WITH CONTRAST
TECHNIQUE: Multidetector CT imaging of the pelvis was performed using the
standard protocol following the bolus administration of intravenous
contrast.
CONTRAST:  100mL OMNIPAQUE IOHEXOL 300 MG/ML  SOLN

[Series 2: axial st · axial · 0.98mm/px · z∈[-347,-107]mm · 14 of 56 slices shown, 16 images]
[im 4/56  soft-tissue]
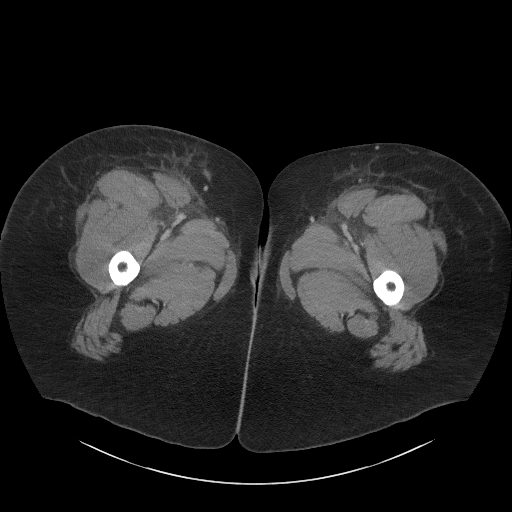
[im 4/56  bone]
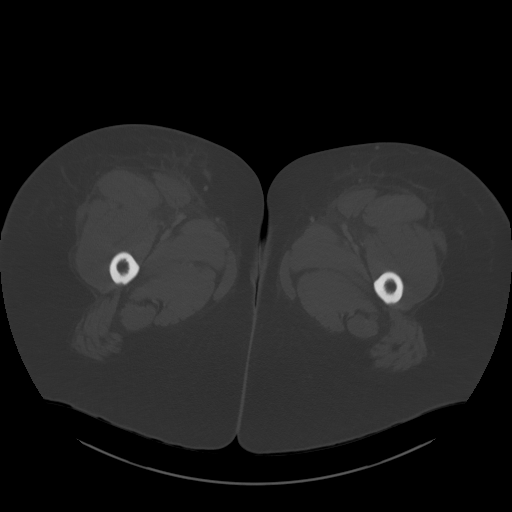
[im 8/56  soft-tissue]
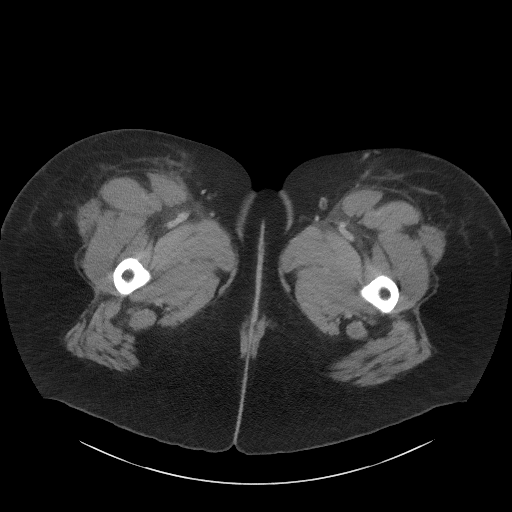
[im 11/56  soft-tissue]
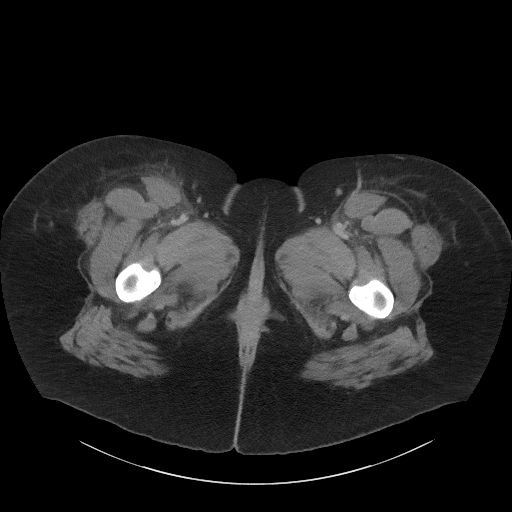
[im 15/56  soft-tissue]
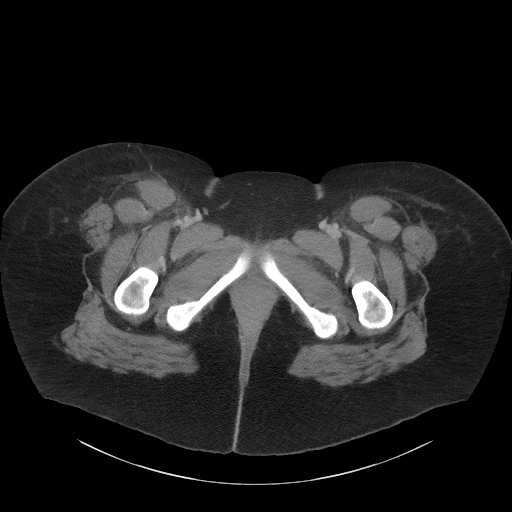
[im 18/56  soft-tissue]
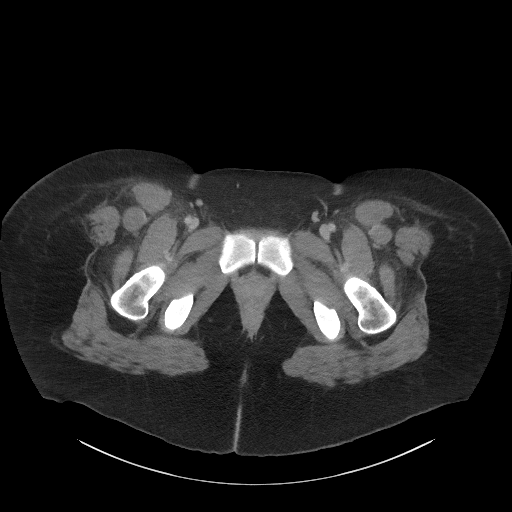
[im 22/56  soft-tissue]
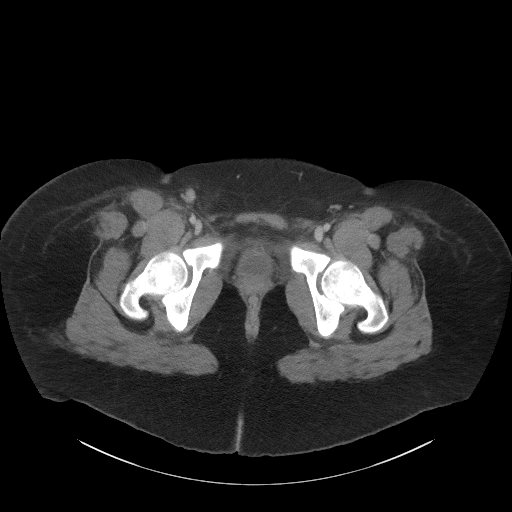
[im 25/56  soft-tissue]
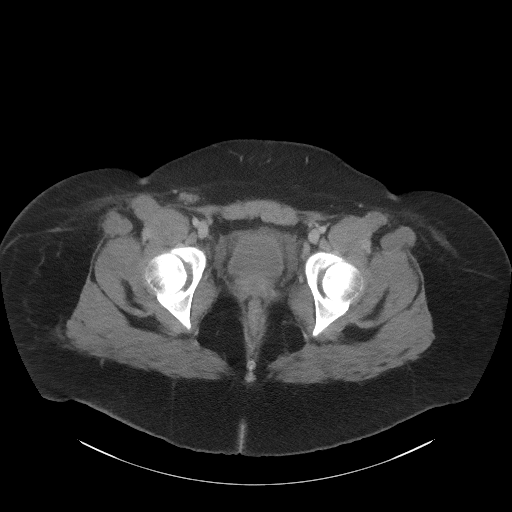
[im 31/56  soft-tissue]
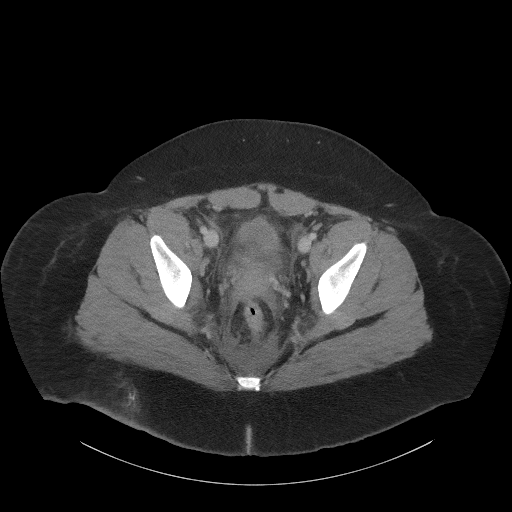
[im 34/56  soft-tissue]
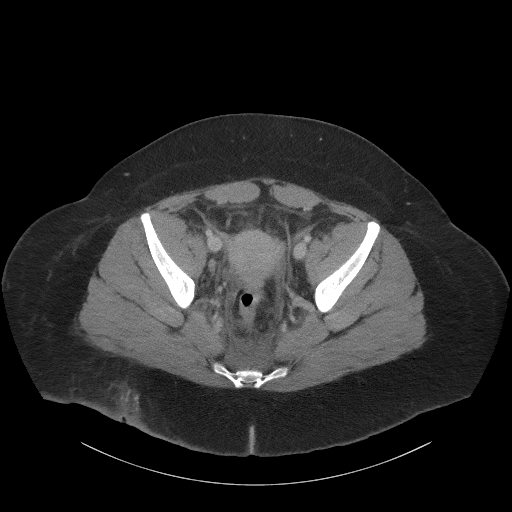
[im 34/56  bone]
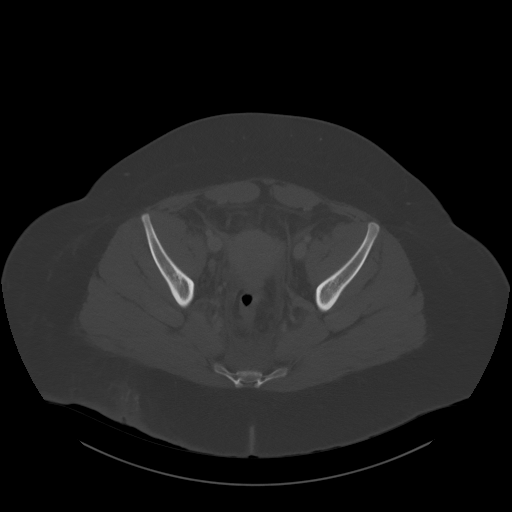
[im 38/56  soft-tissue]
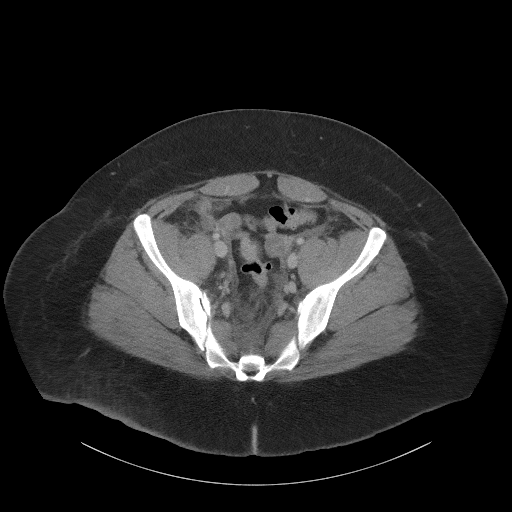
[im 41/56  soft-tissue]
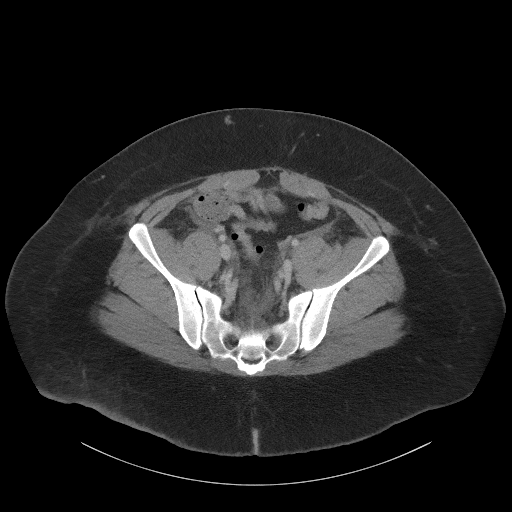
[im 45/56  soft-tissue]
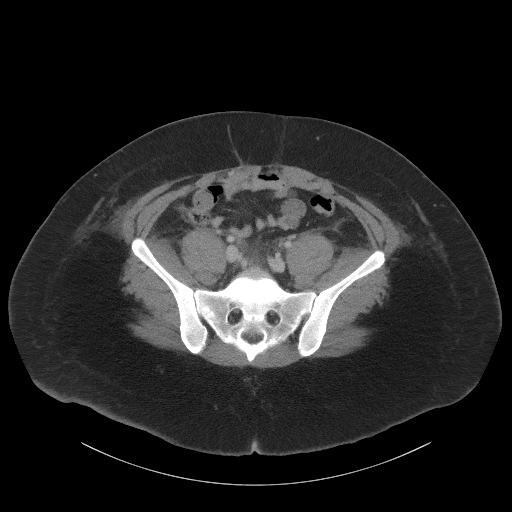
[im 48/56  soft-tissue]
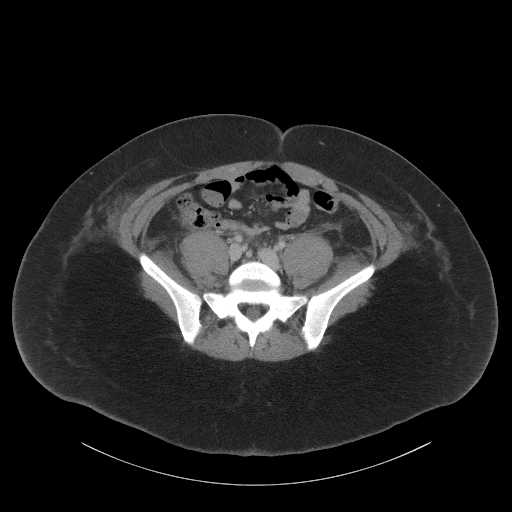
[im 52/56  soft-tissue]
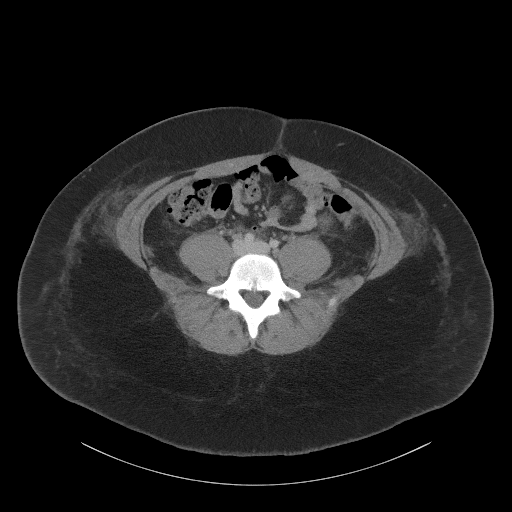

[Series 602: coronal st · coronal · 0.98mm/px · 3 of 152 slices shown]
[im 51/152  soft-tissue]
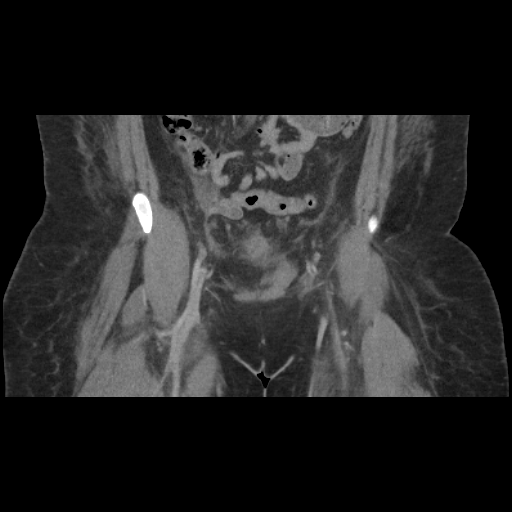
[im 68/152  soft-tissue]
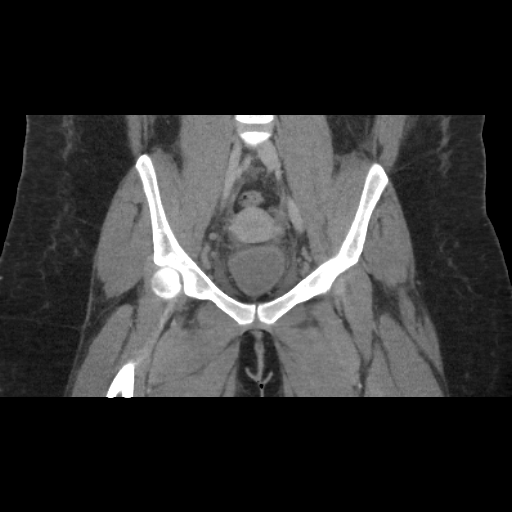
[im 84/152  soft-tissue]
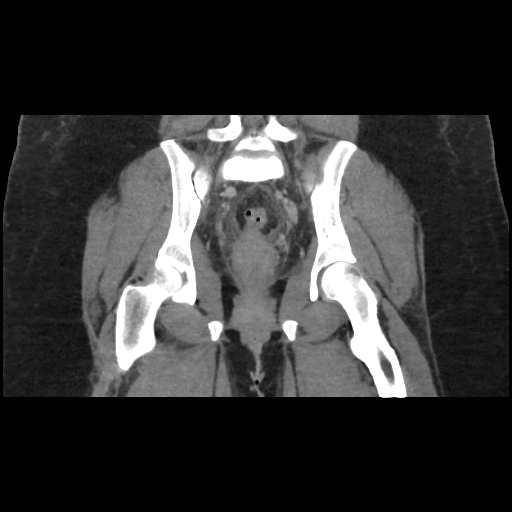

[17 of 46 positions shown; findings below may reference images not displayed]

FINDINGS: Urinary Tract: There is circumferential wall thickening of the of
the urinary bladder.

Bowel: The visualized bowel is unremarkable. The appendix is
unremarkable.

Vascular/Lymphatic: There is no acute vascular abnormality detected.
There are mildly enlarged bilateral inguinal lymph nodes, right
worse than left.

Reproductive:  No mass or other significant abnormality

Other: There are persistent diffuse inflammatory changes in the
pelvis with a small amount of free fluid. This is similar but
slightly improved from prior study.

Musculoskeletal: There is diffuse intracompartmental edema within
the proximal thighs. There may be some edema involving some of the
musculature of the proximal thighs bilaterally. This appearance is
near symmetric. There is a defect in the posterior subcutaneous soft
tissues overlying the right gluteal muscle. In this region, there is
fat induration without evidence for an abscess. There is nonspecific
edema along the patient's flanks.
IMPRESSION: 1. There is a defect in the posterior subcutaneous soft tissues
overlying the right gluteal muscle. In this region, there is fat
induration without evidence for an abscess.
2. There are persistent diffuse inflammatory changes in the pelvis
with a small amount of free fluid. This is similar but slightly
improved from prior study.
3. Circumferential wall thickening of the urinary bladder.
Correlation with urinalysis is recommended to exclude cystitis.
4. Apparent intracompartmental edema involving the proximal thighs
bilaterally with possible intramuscular edema bilaterally is
nonspecific but can be seen in patients with rhabdomyolysis.
Correlation with laboratory studies, specifically CK, is
recommended.

## 2021-10-03 IMAGING — US US SOFT TISSUE
1 series · 9 of 9 positions shown · non-contrast
Comparison: None.

CLINICAL DATA: Left periareolar cellulitis.

EXAM:
ULTRASOUND OF HEAD/NECK SOFT TISSUES
TECHNIQUE: Ultrasound examination of the head and neck soft tissues was
performed in the area of clinical concern.

[Series 1: us soft tissue · 9 acquisitions, 9 frames shown]
[im 1/9]
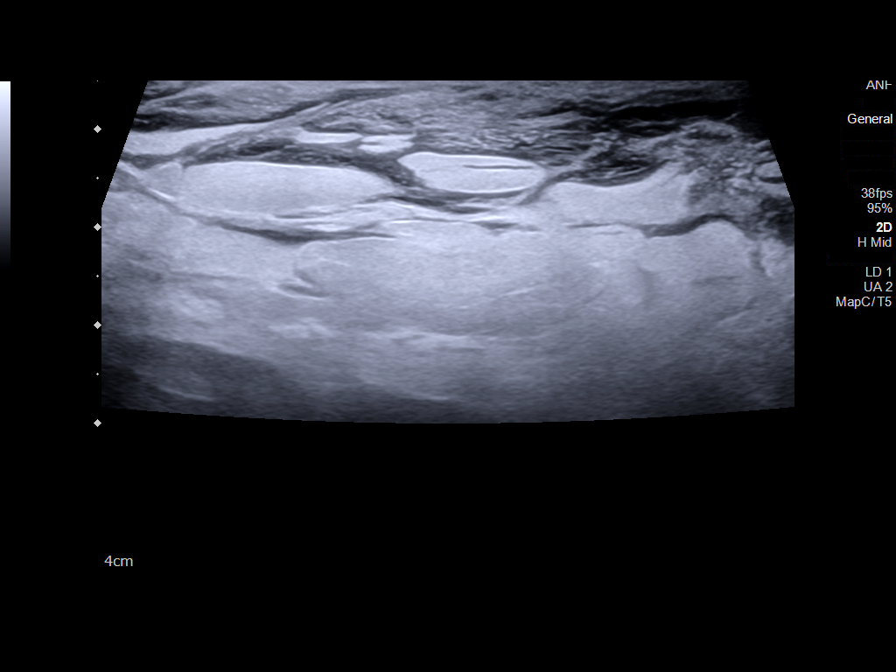
[im 2/9]
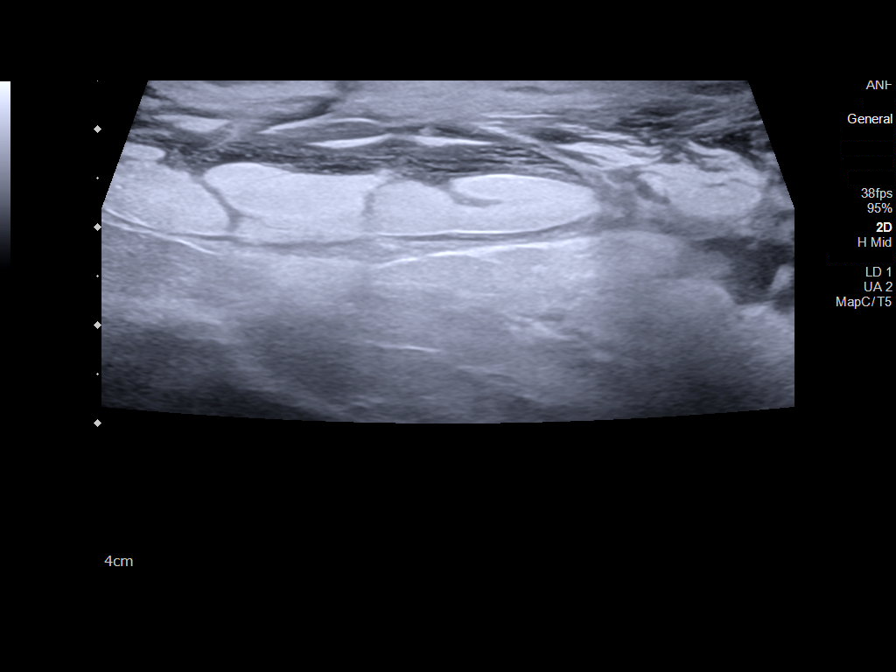
[im 3/9]
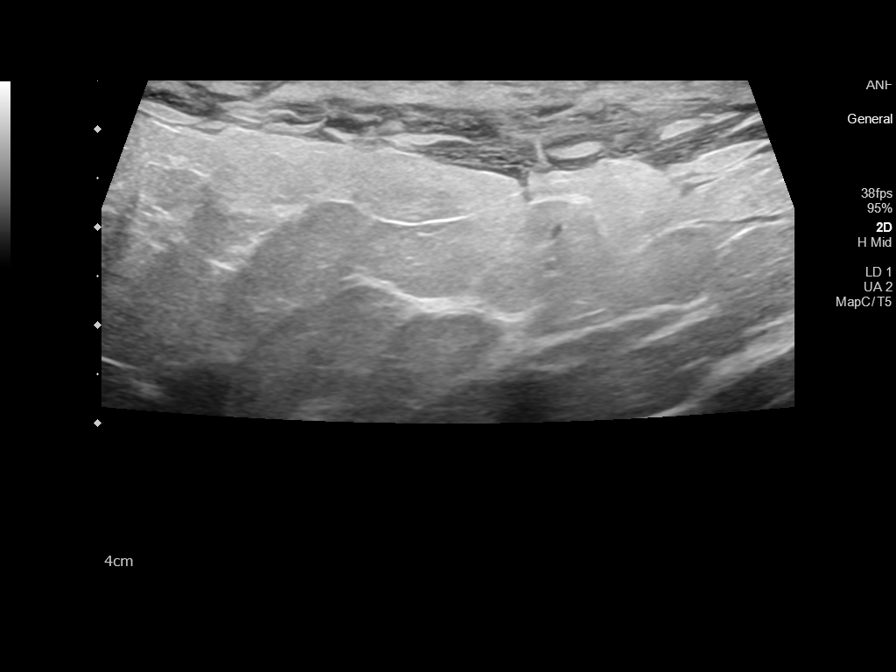
[im 4/9]
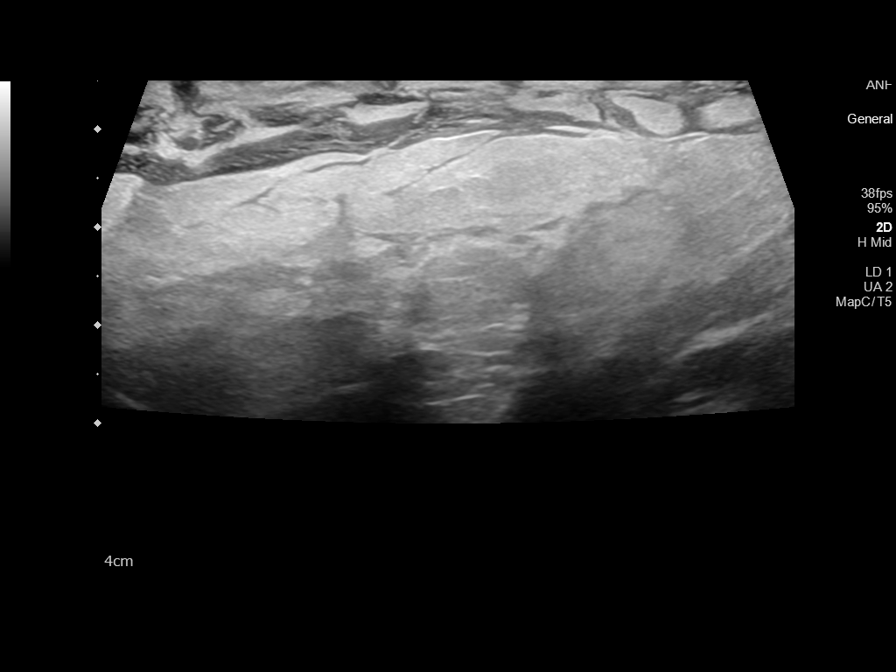
[im 5/9]
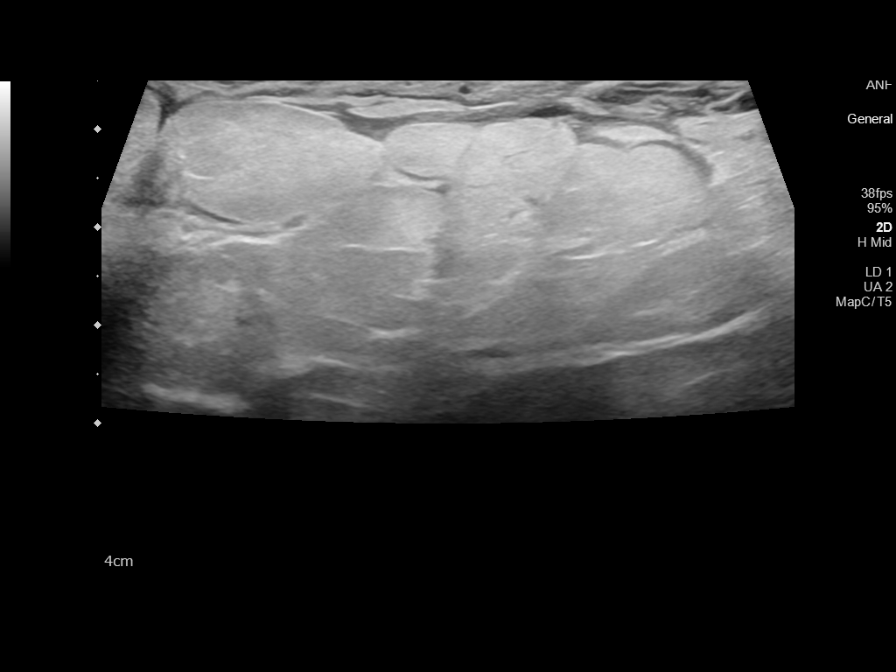
[im 6/9]
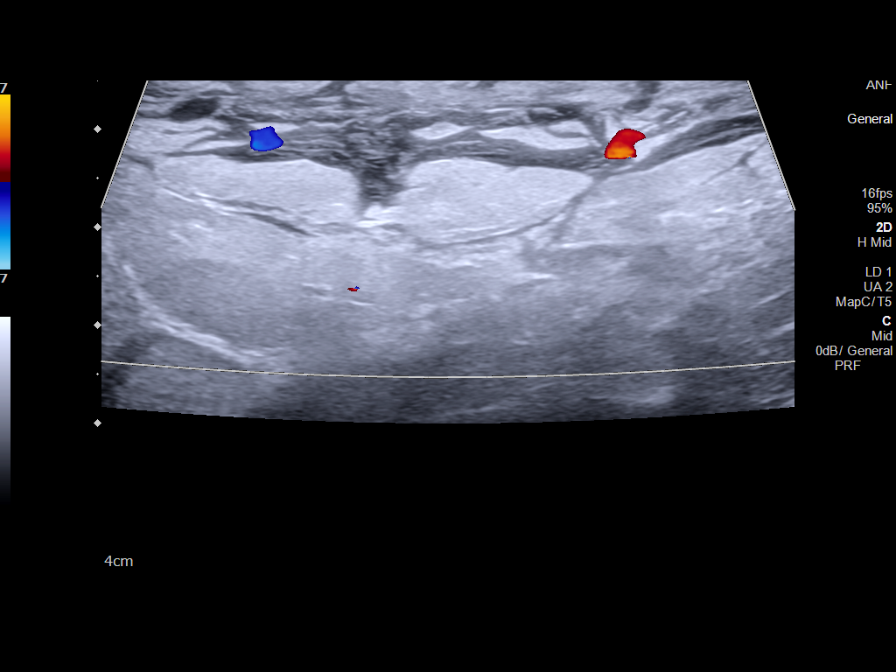
[im 7/9]
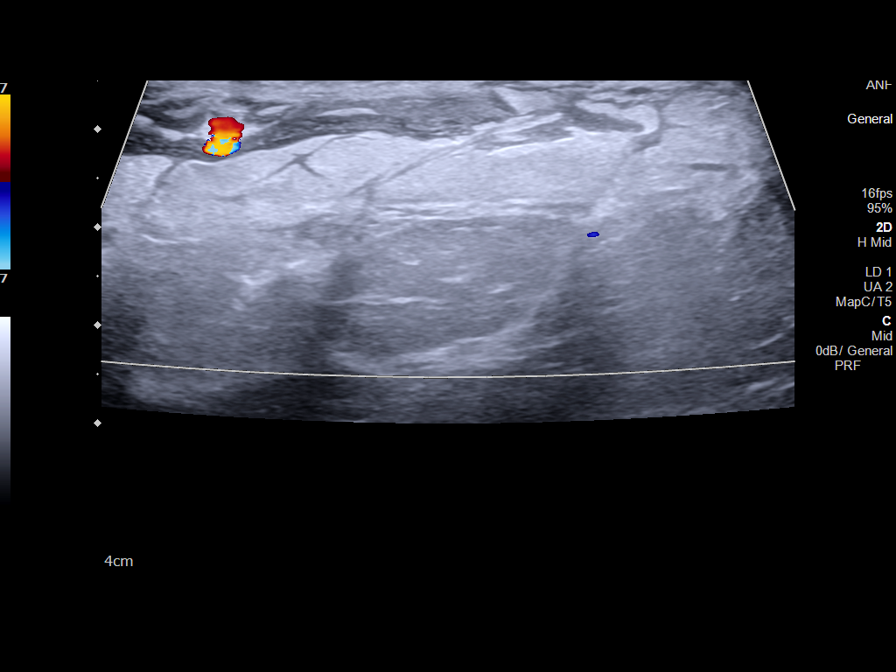
[im 8/9]
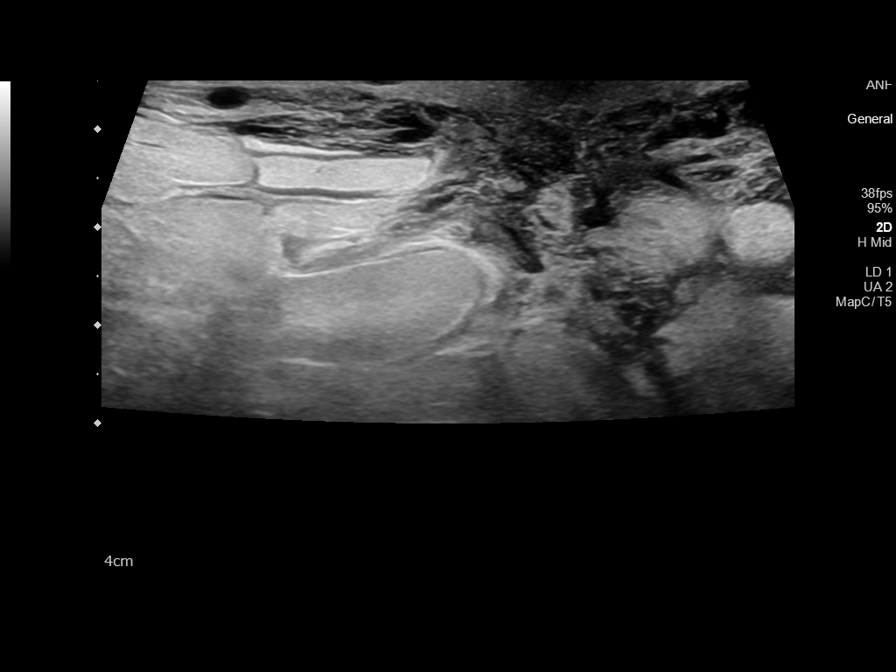
[im 9/9]
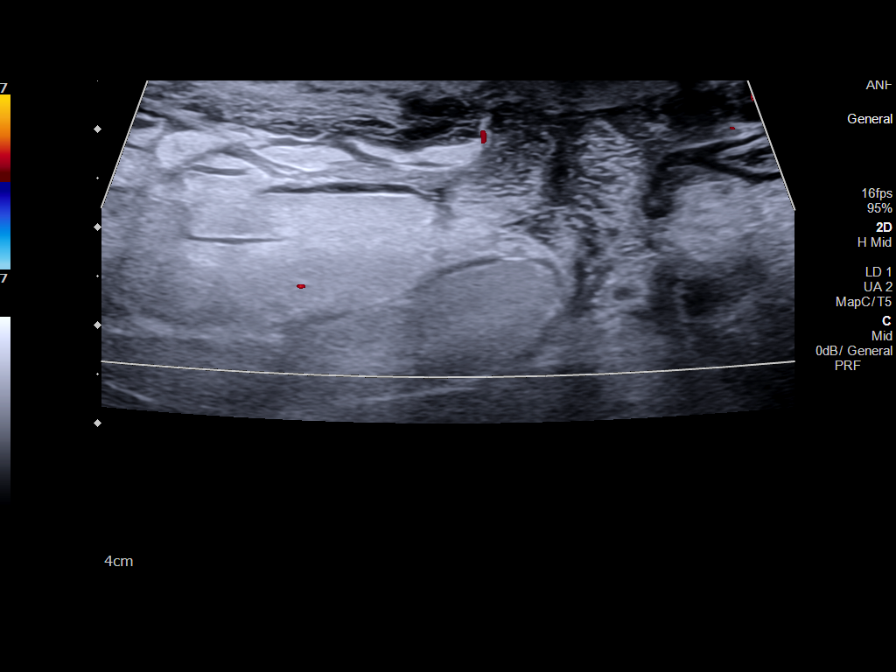

[9 of 9 positions shown; findings below may reference images not displayed]

FINDINGS: Sonographic images of the area of concern in patient's left breast
10 o'clock, retroareolar location, demonstrate diffuse skin and
breast edema with skin thickening of up to 5 mm. No suspicious
masses or drainable fluid collections are seen on the presented
images.
IMPRESSION: Diffuse subareolar left breast edema, consistent with the given
history cellulitis/mastitis.

No breast abscess is seen on the presented images. Please note that
the complete depth of the breast tissue is not seen on this exam.

## 2021-11-28 LAB — OB RESULTS CONSOLE GC/CHLAMYDIA: Chlamydia: NEGATIVE

## 2021-12-08 LAB — HM PAP SMEAR
